# Patient Record
Sex: Female | Born: 1960
Health system: Southern US, Community
[De-identification: ages and names within clinical notes are randomized; demographics above are authoritative.]

## PROBLEM LIST (undated history)

## (undated) ENCOUNTER — Ambulatory Visit (HOSPITAL_COMMUNITY): Discharge status: Absent without leave | Payer: BLUE CROSS/BLUE SHIELD

## (undated) DIAGNOSIS — B019 Varicella without complication: Secondary | ICD-10-CM

## (undated) DIAGNOSIS — T7840XA Allergy, unspecified, initial encounter: Secondary | ICD-10-CM

## (undated) DIAGNOSIS — R739 Hyperglycemia, unspecified: Secondary | ICD-10-CM

## (undated) DIAGNOSIS — H6590 Unspecified nonsuppurative otitis media, unspecified ear: Principal | ICD-10-CM

## (undated) DIAGNOSIS — Z124 Encounter for screening for malignant neoplasm of cervix: Secondary | ICD-10-CM

## (undated) DIAGNOSIS — Z0000 Encounter for general adult medical examination without abnormal findings: Secondary | ICD-10-CM

## (undated) DIAGNOSIS — R000 Tachycardia, unspecified: Secondary | ICD-10-CM

## (undated) DIAGNOSIS — D649 Anemia, unspecified: Secondary | ICD-10-CM

## (undated) DIAGNOSIS — R768 Other specified abnormal immunological findings in serum: Secondary | ICD-10-CM

## (undated) DIAGNOSIS — T8859XA Other complications of anesthesia, initial encounter: Secondary | ICD-10-CM

## (undated) DIAGNOSIS — Z87440 Personal history of urinary (tract) infections: Secondary | ICD-10-CM

## (undated) DIAGNOSIS — R002 Palpitations: Secondary | ICD-10-CM

## (undated) DIAGNOSIS — Z531 Procedure and treatment not carried out because of patient's decision for reasons of belief and group pressure: Secondary | ICD-10-CM

## (undated) DIAGNOSIS — R030 Elevated blood-pressure reading, without diagnosis of hypertension: Secondary | ICD-10-CM

## (undated) DIAGNOSIS — N951 Menopausal and female climacteric states: Principal | ICD-10-CM

## (undated) DIAGNOSIS — R011 Cardiac murmur, unspecified: Secondary | ICD-10-CM

## (undated) DIAGNOSIS — N6320 Unspecified lump in the left breast, unspecified quadrant: Secondary | ICD-10-CM

## (undated) DIAGNOSIS — E785 Hyperlipidemia, unspecified: Secondary | ICD-10-CM

## (undated) HISTORY — DX: Elevated blood-pressure reading, without diagnosis of hypertension: R03.0

## (undated) HISTORY — DX: Encounter for screening for malignant neoplasm of cervix: Z12.4

## (undated) HISTORY — DX: Palpitations: R00.2

## (undated) HISTORY — DX: Hyperglycemia, unspecified: R73.9

## (undated) HISTORY — PX: LACERATION REPAIR: SHX5168

## (undated) HISTORY — DX: Tachycardia, unspecified: R00.0

## (undated) HISTORY — DX: Anemia, unspecified: D64.9

## (undated) HISTORY — DX: Unspecified nonsuppurative otitis media, unspecified ear: H65.90

## (undated) HISTORY — DX: Other specified abnormal immunological findings in serum: R76.8

## (undated) HISTORY — DX: Allergy, unspecified, initial encounter: T78.40XA

## (undated) HISTORY — DX: Hyperlipidemia, unspecified: E78.5

## (undated) HISTORY — DX: Cardiac murmur, unspecified: R01.1

## (undated) HISTORY — DX: Menopausal and female climacteric states: N95.1

## (undated) HISTORY — DX: Personal history of urinary (tract) infections: Z87.440

## (undated) HISTORY — DX: Unspecified lump in the left breast, unspecified quadrant: N63.20

## (undated) HISTORY — DX: Encounter for general adult medical examination without abnormal findings: Z00.00

## (undated) HISTORY — DX: Procedure and treatment not carried out because of patient's decision for reasons of belief and group pressure: Z53.1

## (undated) HISTORY — DX: Varicella without complication: B01.9

---

## 1997-03-08 ENCOUNTER — Observation Stay (HOSPITAL_COMMUNITY): Admission: AD | Admit: 1997-03-08 | Discharge: 1997-03-09 | Payer: Self-pay | Admitting: Gynecology

## 1997-03-16 ENCOUNTER — Observation Stay (HOSPITAL_COMMUNITY): Admission: AD | Admit: 1997-03-16 | Discharge: 1997-03-16 | Payer: Self-pay | Admitting: Gynecology

## 1997-03-17 ENCOUNTER — Inpatient Hospital Stay (HOSPITAL_COMMUNITY): Admission: AD | Admit: 1997-03-17 | Discharge: 1997-03-19 | Payer: Self-pay | Admitting: Obstetrics and Gynecology

## 1997-03-25 ENCOUNTER — Inpatient Hospital Stay (HOSPITAL_COMMUNITY): Admission: AD | Admit: 1997-03-25 | Discharge: 1997-03-25 | Payer: Self-pay | Admitting: Gynecology

## 1997-03-26 ENCOUNTER — Inpatient Hospital Stay (HOSPITAL_COMMUNITY): Admission: AD | Admit: 1997-03-26 | Discharge: 1997-03-29 | Payer: Self-pay | Admitting: Gynecology

## 1997-05-03 ENCOUNTER — Other Ambulatory Visit: Admission: RE | Admit: 1997-05-03 | Discharge: 1997-05-03 | Payer: Self-pay | Admitting: Obstetrics and Gynecology

## 1997-12-14 ENCOUNTER — Ambulatory Visit (HOSPITAL_COMMUNITY): Admission: RE | Admit: 1997-12-14 | Discharge: 1997-12-14 | Payer: Self-pay | Admitting: Obstetrics and Gynecology

## 1999-04-20 ENCOUNTER — Other Ambulatory Visit: Admission: RE | Admit: 1999-04-20 | Discharge: 1999-04-20 | Payer: Self-pay | Admitting: Gynecology

## 2000-06-06 ENCOUNTER — Other Ambulatory Visit: Admission: RE | Admit: 2000-06-06 | Discharge: 2000-06-06 | Payer: Self-pay | Admitting: Gynecology

## 2000-09-17 ENCOUNTER — Encounter: Admission: RE | Admit: 2000-09-17 | Discharge: 2000-09-17 | Payer: Self-pay | Admitting: Family Medicine

## 2000-09-17 ENCOUNTER — Encounter: Payer: Self-pay | Admitting: Family Medicine

## 2000-10-29 ENCOUNTER — Encounter: Admission: RE | Admit: 2000-10-29 | Discharge: 2000-10-29 | Payer: Self-pay | Admitting: Family Medicine

## 2000-10-29 ENCOUNTER — Encounter: Payer: Self-pay | Admitting: Family Medicine

## 2002-02-12 ENCOUNTER — Other Ambulatory Visit: Admission: RE | Admit: 2002-02-12 | Discharge: 2002-02-12 | Payer: Self-pay | Admitting: Gynecology

## 2004-03-13 ENCOUNTER — Other Ambulatory Visit: Admission: RE | Admit: 2004-03-13 | Discharge: 2004-03-13 | Payer: Self-pay | Admitting: Family Medicine

## 2005-11-14 ENCOUNTER — Other Ambulatory Visit: Admission: RE | Admit: 2005-11-14 | Discharge: 2005-11-14 | Payer: Self-pay | Admitting: Gynecology

## 2007-02-06 LAB — HM MAMMOGRAPHY

## 2008-02-06 LAB — HM PAP SMEAR

## 2009-11-09 ENCOUNTER — Ambulatory Visit: Payer: Self-pay | Admitting: Gynecology

## 2009-11-16 ENCOUNTER — Ambulatory Visit: Payer: Self-pay | Admitting: Gynecology

## 2011-03-07 ENCOUNTER — Ambulatory Visit (INDEPENDENT_AMBULATORY_CARE_PROVIDER_SITE_OTHER): Payer: PRIVATE HEALTH INSURANCE | Admitting: Family Medicine

## 2011-03-07 ENCOUNTER — Encounter: Payer: Self-pay | Admitting: Family Medicine

## 2011-03-07 DIAGNOSIS — R030 Elevated blood-pressure reading, without diagnosis of hypertension: Secondary | ICD-10-CM

## 2011-03-07 DIAGNOSIS — Z0000 Encounter for general adult medical examination without abnormal findings: Secondary | ICD-10-CM

## 2011-03-07 DIAGNOSIS — Z87440 Personal history of urinary (tract) infections: Secondary | ICD-10-CM

## 2011-03-07 DIAGNOSIS — R7309 Other abnormal glucose: Secondary | ICD-10-CM

## 2011-03-07 DIAGNOSIS — R768 Other specified abnormal immunological findings in serum: Secondary | ICD-10-CM

## 2011-03-07 DIAGNOSIS — R002 Palpitations: Secondary | ICD-10-CM

## 2011-03-07 DIAGNOSIS — R739 Hyperglycemia, unspecified: Secondary | ICD-10-CM

## 2011-03-07 DIAGNOSIS — E785 Hyperlipidemia, unspecified: Secondary | ICD-10-CM

## 2011-03-07 DIAGNOSIS — IMO0001 Reserved for inherently not codable concepts without codable children: Secondary | ICD-10-CM

## 2011-03-07 DIAGNOSIS — H409 Unspecified glaucoma: Secondary | ICD-10-CM

## 2011-03-07 DIAGNOSIS — R894 Abnormal immunological findings in specimens from other organs, systems and tissues: Secondary | ICD-10-CM

## 2011-03-07 DIAGNOSIS — D649 Anemia, unspecified: Secondary | ICD-10-CM

## 2011-03-07 DIAGNOSIS — R079 Chest pain, unspecified: Secondary | ICD-10-CM

## 2011-03-07 LAB — HEPATIC FUNCTION PANEL
ALT: 13 U/L (ref 0–35)
AST: 19 U/L (ref 0–37)
Albumin: 4.4 g/dL (ref 3.5–5.2)
Alkaline Phosphatase: 53 U/L (ref 39–117)
Bilirubin, Direct: 0.1 mg/dL (ref 0.0–0.3)
Total Bilirubin: 0.6 mg/dL (ref 0.3–1.2)
Total Protein: 7.2 g/dL (ref 6.0–8.3)

## 2011-03-07 LAB — RENAL FUNCTION PANEL
Albumin: 4.4 g/dL (ref 3.5–5.2)
BUN: 11 mg/dL (ref 6–23)
CO2: 27 mEq/L (ref 19–32)
Calcium: 9.1 mg/dL (ref 8.4–10.5)
Chloride: 106 mEq/L (ref 96–112)
Potassium: 4.1 mEq/L (ref 3.5–5.1)

## 2011-03-07 LAB — CBC
Hemoglobin: 12.6 g/dL (ref 12.0–15.0)
MCHC: 34 g/dL (ref 30.0–36.0)
MCV: 96.3 fl (ref 78.0–100.0)
Platelets: 206 10*3/uL (ref 150.0–400.0)
RBC: 3.84 Mil/uL — ABNORMAL LOW (ref 3.87–5.11)

## 2011-03-07 NOTE — Patient Instructions (Signed)

## 2011-03-12 ENCOUNTER — Encounter: Payer: Self-pay | Admitting: Family Medicine

## 2011-03-12 DIAGNOSIS — R0789 Other chest pain: Secondary | ICD-10-CM

## 2011-03-12 DIAGNOSIS — Z87440 Personal history of urinary (tract) infections: Secondary | ICD-10-CM | POA: Insufficient documentation

## 2011-03-12 DIAGNOSIS — R7689 Other specified abnormal immunological findings in serum: Secondary | ICD-10-CM

## 2011-03-12 DIAGNOSIS — D649 Anemia, unspecified: Secondary | ICD-10-CM | POA: Insufficient documentation

## 2011-03-12 DIAGNOSIS — R002 Palpitations: Secondary | ICD-10-CM | POA: Insufficient documentation

## 2011-03-12 DIAGNOSIS — H409 Unspecified glaucoma: Secondary | ICD-10-CM | POA: Insufficient documentation

## 2011-03-12 DIAGNOSIS — R739 Hyperglycemia, unspecified: Secondary | ICD-10-CM

## 2011-03-12 DIAGNOSIS — IMO0001 Reserved for inherently not codable concepts without codable children: Secondary | ICD-10-CM

## 2011-03-12 DIAGNOSIS — E785 Hyperlipidemia, unspecified: Secondary | ICD-10-CM

## 2011-03-12 DIAGNOSIS — R768 Other specified abnormal immunological findings in serum: Secondary | ICD-10-CM | POA: Insufficient documentation

## 2011-03-12 HISTORY — DX: Hyperlipidemia, unspecified: E78.5

## 2011-03-12 HISTORY — DX: Hyperglycemia, unspecified: R73.9

## 2011-03-12 HISTORY — DX: Other specified abnormal immunological findings in serum: R76.89

## 2011-03-12 HISTORY — DX: Other specified abnormal immunological findings in serum: R76.8

## 2011-03-12 HISTORY — DX: Reserved for inherently not codable concepts without codable children: IMO0001

## 2011-03-12 HISTORY — DX: Other chest pain: R07.89

## 2011-03-12 NOTE — Assessment & Plan Note (Signed)
Patient describes atypical electrical type discomfort lasting only seconds. Also notes a mild bit of congestion and thinks it may be related. Happened for several days last week and is now resolved. She had some other episodes of more like tightness which can occur at rest it has not been significant recently without associated symptoms. Did have a cardiac workup about 3 years ago. We'll consider further workup if symptoms return or worsen.

## 2011-03-12 NOTE — Assessment & Plan Note (Signed)
No significant episodes recently. Avoid caffeine and report worsening symptoms

## 2011-03-12 NOTE — Assessment & Plan Note (Signed)
Asymptomatic. 

## 2011-03-12 NOTE — Progress Notes (Signed)
**Note Maria-Identified via Obfuscation** Patient ID: Maria Lucas, female   DOB: Apr 05, 1960, 51 y.o.   MRN: [id number] Lina Hitch [id number] [birth year] 03/12/2011      Progress Note New Patient  Subjective  Chief Complaint  Chief Complaint  Patient presents with  . Establish Care    new patient    HPI  Patient is a 51 year old Caucasian female who is in today for new patient appointment. Overall she is in good health. Denies any significant recent illness although she has had some mild congestion and some occasional chest discomfort she describes as electric and fleeting. She says the impulses last for seconds and had no associated symptoms such as shortness of breath diaphoresis palpitations or nausea. She does have some separate episodes over the years both palpitations diagnosed as PVCs. These have not been occurring recently. She did undergo stress testing with outpatient cardiology roughly 3 years ago and reports that workup was negative. Her blood pressure is elevated in the office but she reports when she checks it outside the doctor's office she gets numbers ranging from roughly 1:15 to 130/60 to 70s. No headaches or other concerning symptoms. Follow with gynecology.  Past Medical History  Diagnosis Date  . Chicken pox as a child  . History of UTI   . Palpitations   . Chest pain 03/12/2011  . Glaucoma 03/12/2011  . Anemia     menorraghia  . Elevated antinuclear antibody (ANA) level 03/12/2011  . Hyperlipidemia 03/12/2011  . Hyperglycemia 03/12/2011    Past Surgical History  Procedure Date  . Laceration repair     age 84, dog attack, stitches in face, multipe sites right side of face    Family History  Problem Relation Age of Onset  . Heart attack Father 17  . Hypertension Father   . Hyperlipidemia Father   . Heart disease Father 32    MI  . Hypertension Sister   . Ovarian cysts Sister   . Heart attack Brother 43  . Hypertension Brother   . Heart disease Brother   . Anxiety disorder Daughter   . OCD Daughter    . Heart attack Maternal Grandmother   . Hypertension Maternal Grandmother   . Cancer Maternal Grandfather     skin  . Glaucoma Maternal Grandfather   . Stroke Paternal Grandfather   . Cancer Mother     skin    History   Social History  . Marital Status: Married    Spouse Name: N/A    Number of Children: N/A  . Years of Education: N/A   Occupational History  . Not on file.   Social History Main Topics  . Smoking status: Never Smoker   . Smokeless tobacco: Never Used  . Alcohol Use: No  . Drug Use: No  . Sexually Active: Yes -- Female partner(s)   Other Topics Concern  . Not on file   Social History Narrative  . No narrative on file    No current outpatient prescriptions on file prior to visit.    Allergies  Allergen Reactions  . Penicillins Rash  . Cleocin Rash    Review of Systems  Review of Systems  Constitutional: Negative for fever, chills and malaise/fatigue.  HENT: Negative for hearing loss, nosebleeds and congestion.   Eyes: Negative for discharge.  Respiratory: Negative for cough, sputum production, shortness of breath and wheezing.   Cardiovascular: Positive for chest pain and palpitations. Negative for leg swelling.  Gastrointestinal: Negative for heartburn, nausea, vomiting, abdominal pain, diarrhea, constipation and  blood in stool.  Genitourinary: Negative for dysuria, urgency, frequency and hematuria.  Musculoskeletal: Negative for myalgias, back pain and falls.  Skin: Negative for rash.  Neurological: Negative for dizziness, tremors, sensory change, focal weakness, loss of consciousness, weakness and headaches.  Endo/Heme/Allergies: Negative for polydipsia. Does not bruise/bleed easily.  Psychiatric/Behavioral: Negative for depression and suicidal ideas. The patient is not nervous/anxious and does not have insomnia.     Objective  BP 153/88  Pulse 95  Temp(Src) 98.9 F (37.2 C) (Temporal)  Ht 5' 4.25" (1.632 m)  Wt 145 lb (65.772 kg)   BMI 24.70 kg/m2  SpO2 99%  LMP 03/03/2011  Physical Exam  Physical Exam  Constitutional: She is oriented to person, place, and time and well-developed, well-nourished, and in no distress. No distress.  HENT:  Head: Normocephalic and atraumatic.  Right Ear: External ear normal.  Left Ear: External ear normal.  Nose: Nose normal.  Mouth/Throat: Oropharynx is clear and moist. No oropharyngeal exudate.  Eyes: Conjunctivae are normal. Pupils are equal, round, and reactive to light. Right eye exhibits no discharge. Left eye exhibits no discharge. No scleral icterus.  Neck: Normal range of motion. Neck supple. No thyromegaly present.  Cardiovascular: Normal rate, regular rhythm, normal heart sounds and intact distal pulses.   No murmur heard. Pulmonary/Chest: Effort normal and breath sounds normal. No respiratory distress. She has no wheezes. She has no rales.  Abdominal: Soft. Bowel sounds are normal. She exhibits no distension and no mass. There is no tenderness.  Musculoskeletal: Normal range of motion. She exhibits no edema and no tenderness.  Lymphadenopathy:    She has no cervical adenopathy.  Neurological: She is alert and oriented to person, place, and time. She has normal reflexes. No cranial nerve deficit. Coordination normal.  Skin: Skin is warm and dry. No rash noted. She is not diaphoretic.  Psychiatric: Mood, memory and affect normal.       Assessment & Plan   Elevated antinuclear antibody (ANA) level Asymptomatic  Anemia Mild in past will monitor  Palpitations No significant episodes recently. Avoid caffeine and report worsening symptoms  History of UTI No c/o, maintain adequate hydration  Hyperglycemia Will monitor, minimize simple carbs  Hyperlipidemia Will need to avoid trans fats, minimize saturated fats and simple carbs, consider MegaRed caps daily and continue to monitor  Glaucoma Follows with Opthamology  Chest pain Patient describes atypical  electrical type discomfort lasting only seconds. Also notes a mild bit of congestion and thinks it may be related. Happened for several days last week and is now resolved. She had some other episodes of more like tightness which can occur at rest it has not been significant recently without associated symptoms. Did have a cardiac workup about 3 years ago. We'll consider further workup if symptoms return or worsen.

## 2011-03-12 NOTE — Assessment & Plan Note (Signed)
No c/o, maintain adequate hydration

## 2011-03-12 NOTE — Assessment & Plan Note (Signed)
Will need to avoid trans fats, minimize saturated fats and simple carbs, consider MegaRed caps daily and continue to monitor

## 2011-03-12 NOTE — Assessment & Plan Note (Signed)
Follows with Opthamology 

## 2011-03-12 NOTE — Assessment & Plan Note (Signed)
Will monitor, minimize simple carbs 

## 2011-03-12 NOTE — Assessment & Plan Note (Signed)
Mild in past will monitor

## 2011-03-21 ENCOUNTER — Other Ambulatory Visit (HOSPITAL_COMMUNITY)
Admission: RE | Admit: 2011-03-21 | Discharge: 2011-03-21 | Disposition: A | Discharge status: Home / Self Care | Payer: PRIVATE HEALTH INSURANCE | Source: Ambulatory Visit | Attending: Family Medicine | Admitting: Family Medicine

## 2011-03-21 ENCOUNTER — Encounter: Payer: Self-pay | Admitting: Family Medicine

## 2011-03-21 ENCOUNTER — Ambulatory Visit (INDEPENDENT_AMBULATORY_CARE_PROVIDER_SITE_OTHER): Payer: PRIVATE HEALTH INSURANCE | Admitting: Family Medicine

## 2011-03-21 VITALS — BP 147/84 | HR 90 | Temp 99.2°F | Ht 64.25 in | Wt 141.8 lb

## 2011-03-21 DIAGNOSIS — R002 Palpitations: Secondary | ICD-10-CM

## 2011-03-21 DIAGNOSIS — R000 Tachycardia, unspecified: Secondary | ICD-10-CM

## 2011-03-21 DIAGNOSIS — N6320 Unspecified lump in the left breast, unspecified quadrant: Secondary | ICD-10-CM

## 2011-03-21 DIAGNOSIS — E785 Hyperlipidemia, unspecified: Secondary | ICD-10-CM

## 2011-03-21 DIAGNOSIS — Z1239 Encounter for other screening for malignant neoplasm of breast: Secondary | ICD-10-CM

## 2011-03-21 DIAGNOSIS — IMO0001 Reserved for inherently not codable concepts without codable children: Secondary | ICD-10-CM

## 2011-03-21 DIAGNOSIS — Z124 Encounter for screening for malignant neoplasm of cervix: Secondary | ICD-10-CM | POA: Insufficient documentation

## 2011-03-21 DIAGNOSIS — R030 Elevated blood-pressure reading, without diagnosis of hypertension: Secondary | ICD-10-CM

## 2011-03-21 DIAGNOSIS — N63 Unspecified lump in unspecified breast: Secondary | ICD-10-CM

## 2011-03-21 DIAGNOSIS — Z01419 Encounter for gynecological examination (general) (routine) without abnormal findings: Secondary | ICD-10-CM | POA: Insufficient documentation

## 2011-03-21 HISTORY — DX: Unspecified lump in the left breast, unspecified quadrant: N63.20

## 2011-03-21 HISTORY — DX: Tachycardia, unspecified: R00.0

## 2011-03-21 HISTORY — DX: Encounter for screening for malignant neoplasm of cervix: Z12.4

## 2011-03-21 NOTE — Assessment & Plan Note (Signed)
She reports home numbers fro 115-140 over 60 and 70s. Minimize sodium and monitor numbers, if running hi. Would consider Bystolic to help control tachycardia as well

## 2011-03-21 NOTE — Progress Notes (Signed)
**Note Maria-Identified via Obfuscation** Patient ID: Maria Lucas, female   DOB: [birth year], 51 y.o.   MRN: [id number] Maria Lucas [id number] 12/19/1960 03/21/2011      Progress Note-Follow Up  Subjective  Chief Complaint  Chief Complaint  Patient presents with  . Gynecologic Exam    pap  . chest still tight    HPI  Patient is a 51 year old Caucasian female present today for Pap smear. Overall doing well but she does continue to struggle with palpitations intermittently. This is been going on for years and she has had a cardiac workup in the past. She had 2 episodes of palpitations last week which lasted roughly 30-60 seconds. She had no associated symptoms. No diaphoresis, chest pain, shortness of breath, fatigue, syncope. She denies any obvious turgor. She checks her blood pressure at home and finds her systolic is roughly 1:15 to 140 and her diastolic is generally in the 60s and 70s. Her pulse varies as well in the high she's noted is 106. No recent illness, fevers, or chest pain no palpitations, shortness of breath, GI or GU complaints. She does still have a slight sensation of her chest feeling tight as if she hasn't fully recovered from her previous respiratory symptoms but no wheezing or actual pain is noted. She does note roughly one week of some tenderness in swelling in her left lateral breast  Past Medical History  Diagnosis Date  . Chicken pox as a child  . History of UTI   . Palpitations   . Chest pain 03/12/2011  . Glaucoma 03/12/2011  . Anemia     menorraghia  . Elevated antinuclear antibody (ANA) level 03/12/2011  . Hyperlipidemia 03/12/2011  . Hyperglycemia 03/12/2011  . Elevated BP 03/12/2011  . Screening for cervical cancer 03/21/2011  . Tachycardia 03/21/2011  . Breast mass, left 03/21/2011    Past Surgical History  Procedure Date  . Laceration repair     age 72, dog attack, stitches in face, multipe sites right side of face    Family History  Problem Relation Age of Onset  . Heart attack Father 50  .  Hypertension Father   . Hyperlipidemia Father   . Heart disease Father 33    MI  . Hypertension Sister   . Ovarian cysts Sister   . Heart attack Brother 43  . Hypertension Brother   . Heart disease Brother   . Anxiety disorder Daughter   . OCD Daughter   . Heart attack Maternal Grandmother   . Hypertension Maternal Grandmother   . Cancer Maternal Grandfather     skin  . Glaucoma Maternal Grandfather   . Stroke Paternal Grandfather   . Cancer Mother     skin    History   Social History  . Marital Status: Married    Spouse Name: N/A    Number of Children: N/A  . Years of Education: N/A   Occupational History  . Not on file.   Social History Main Topics  . Smoking status: Never Smoker   . Smokeless tobacco: Never Used  . Alcohol Use: No  . Drug Use: No  . Sexually Active: Yes -- Female partner(s)   Other Topics Concern  . Not on file   Social History Narrative  . No narrative on file    Current Outpatient Prescriptions on File Prior to Visit  Medication Sig Dispense Refill  . Calcium-Cholecalciferol (VITA-CALCIUM PO) Take by mouth daily. 1 chewable      . Calcium-Magnesium-Vitamin D (CITRACAL CALCIUM+D PO) Take by  mouth daily.      . Ferrous Gluconate (IRON) 240 (27 FE) MG TABS Take 1 tablet by mouth daily.      . Flaxseed Oil OIL Take 1,400 mg by mouth daily.      Marland Kitchen latanoprost (XALATAN) 0.005 % ophthalmic solution Place 1 drop into both eyes at bedtime.      . multivitamin-iron-minerals-folic acid (CENTRUM) chewable tablet Chew 1 tablet by mouth daily.        Allergies  Allergen Reactions  . Penicillins Rash  . Cleocin Rash    Review of Systems  Review of Systems  Constitutional: Negative for fever and malaise/fatigue.  HENT: Negative for congestion.   Eyes: Negative for discharge.  Respiratory: Negative for shortness of breath.   Cardiovascular: Positive for palpitations. Negative for chest pain and leg swelling.  Gastrointestinal: Negative for  nausea, abdominal pain and diarrhea.  Genitourinary: Negative for dysuria.  Musculoskeletal: Negative for falls.  Skin: Negative for rash.  Neurological: Negative for loss of consciousness and headaches.  Endo/Heme/Allergies: Negative for polydipsia.  Psychiatric/Behavioral: Negative for depression and suicidal ideas. The patient is not nervous/anxious and does not have insomnia.     Objective  BP 147/84  Pulse 90  Temp(Src) 99.2 F (37.3 C) (Temporal)  Ht 5' 4.25" (1.632 m)  Wt 141 lb 12.8 oz (64.32 kg)  BMI 24.15 kg/m2  SpO2 100%  LMP 03/03/2011  Physical Exam  Physical Exam  Constitutional: She is oriented to person, place, and time and well-developed, well-nourished, and in no distress. No distress.  HENT:  Head: Normocephalic and atraumatic.  Eyes: Conjunctivae are normal.  Neck: Neck supple. No thyromegaly present.  Cardiovascular: Normal rate, regular rhythm and normal heart sounds.   No murmur heard. Pulmonary/Chest: Effort normal and breath sounds normal. She has no wheezes.  Abdominal: She exhibits no distension and no mass.  Genitourinary: Vagina normal, uterus normal, cervix normal, right adnexa normal and left adnexa normal. No vaginal discharge found.       Right breast without lesions, skin changes or discharge. Left breast with 2 cm, firm, mobile lesion at 3 oclock no other abnormalities  Musculoskeletal: She exhibits no edema.       Mild scoliosis thoracic spine to left and then to right  Lymphadenopathy:    She has no cervical adenopathy.  Neurological: She is alert and oriented to person, place, and time.  Skin: Skin is warm and dry. No rash noted. She is not diaphoretic.  Psychiatric: Memory, affect and judgment normal.    Lab Results  Component Value Date   TSH 1.28 03/07/2011   Lab Results  Component Value Date   WBC 5.0 03/07/2011   HGB 12.6 03/07/2011   HCT 37.0 03/07/2011   MCV 96.3 03/07/2011   PLT 206.0 03/07/2011   Lab Results  Component  Value Date   CREATININE 0.8 03/07/2011   BUN 11 03/07/2011   NA 141 03/07/2011   K 4.1 03/07/2011   CL 106 03/07/2011   CO2 27 03/07/2011   Lab Results  Component Value Date   ALT 13 03/07/2011   AST 19 03/07/2011   ALKPHOS 53 03/07/2011   BILITOT 0.6 03/07/2011   Lab Results  Component Value Date   CHOL 250* 03/07/2011      Assessment & Plan  Screening for cervical cancer Pap done today no gyn c/o today, if pap normal will consider skipping next year.  Elevated BP She reports home numbers fro 115-140 over 60 and 70s. Minimize  sodium and monitor numbers, if running hi. Would consider Bystolic to help control tachycardia as well  Palpitations Elevated on arrival but improved with rest. Highest she has seen at home is 106, consider Bystolic if pulse remains elevated. Did have 2 episodes of palpitations lasting 30 to 60 seconds last week. No assoicated symptoms, she will report concerning symptoms  Hyperlipidemia Total cholesterol is 250 she has started [employer] and Metamucil, avoid trans fats, recheck HDL cholesterol panel in 2-3 months  Breast mass, left 2 cm lesion at 3 oclock in left breast, tender, will order MGM to further evaluated

## 2011-03-21 NOTE — Assessment & Plan Note (Addendum)
Elevated on arrival but improved with rest. Highest she has seen at home is 106, consider Bystolic if pulse remains elevated. Did have 2 episodes of palpitations lasting 30 to 60 seconds last week. No assoicated symptoms, she will report concerning symptoms

## 2011-03-21 NOTE — Assessment & Plan Note (Signed)
Pap done today no gyn c/o today, if pap normal will consider skipping next year.

## 2011-03-21 NOTE — Patient Instructions (Signed)

## 2011-03-21 NOTE — Assessment & Plan Note (Addendum)
2 cm lesion at 3 oclock in left breast, tender, will order MGM to further evaluated

## 2011-03-21 NOTE — Assessment & Plan Note (Signed)
Total cholesterol is 250 she has started [employer] and Metamucil, avoid trans fats, recheck HDL cholesterol panel in 2-3 months

## 2011-03-22 ENCOUNTER — Telehealth: Payer: Self-pay | Admitting: Family Medicine

## 2011-03-22 DIAGNOSIS — N6320 Unspecified lump in the left breast, unspecified quadrant: Secondary | ICD-10-CM

## 2011-03-22 NOTE — Telephone Encounter (Signed)
Radiology called requesting order for Ultrasound to further investigate lesion in left breast, order placed

## 2011-03-23 ENCOUNTER — Other Ambulatory Visit: Payer: PRIVATE HEALTH INSURANCE

## 2011-03-23 ENCOUNTER — Encounter: Payer: Self-pay | Admitting: Emergency Medicine

## 2011-03-23 DIAGNOSIS — Z1211 Encounter for screening for malignant neoplasm of colon: Secondary | ICD-10-CM

## 2011-03-27 ENCOUNTER — Ambulatory Visit
Admission: RE | Admit: 2011-03-27 | Discharge: 2011-03-27 | Disposition: A | Discharge status: Home / Self Care | Payer: PRIVATE HEALTH INSURANCE | Source: Ambulatory Visit | Attending: Family Medicine | Admitting: Family Medicine

## 2011-03-27 DIAGNOSIS — N6320 Unspecified lump in the left breast, unspecified quadrant: Secondary | ICD-10-CM

## 2011-03-27 DIAGNOSIS — N63 Unspecified lump in unspecified breast: Secondary | ICD-10-CM

## 2011-04-12 ENCOUNTER — Ambulatory Visit (INDEPENDENT_AMBULATORY_CARE_PROVIDER_SITE_OTHER): Payer: PRIVATE HEALTH INSURANCE | Admitting: Family Medicine

## 2011-04-12 ENCOUNTER — Encounter: Payer: Self-pay | Admitting: Family Medicine

## 2011-04-12 DIAGNOSIS — T7840XA Allergy, unspecified, initial encounter: Secondary | ICD-10-CM

## 2011-04-12 DIAGNOSIS — R030 Elevated blood-pressure reading, without diagnosis of hypertension: Secondary | ICD-10-CM

## 2011-04-12 DIAGNOSIS — IMO0001 Reserved for inherently not codable concepts without codable children: Secondary | ICD-10-CM

## 2011-04-12 DIAGNOSIS — J329 Chronic sinusitis, unspecified: Secondary | ICD-10-CM

## 2011-04-12 DIAGNOSIS — H6590 Unspecified nonsuppurative otitis media, unspecified ear: Secondary | ICD-10-CM

## 2011-04-12 HISTORY — DX: Unspecified nonsuppurative otitis media, unspecified ear: H65.90

## 2011-04-12 HISTORY — DX: Allergy, unspecified, initial encounter: T78.40XA

## 2011-04-12 MED ORDER — SALINE SPRAY 0.65 % NA SOLN: 1.0000 | NASAL | Status: DC | PRN

## 2011-04-12 MED ORDER — FLUTICASONE PROPIONATE 50 MCG/ACT NA SUSP: 2.0000 | Freq: Every day | NASAL | Status: DC

## 2011-04-12 MED ORDER — LORATADINE 10 MG PO TABS: 10.0000 mg | ORAL_TABLET | Freq: Every day | ORAL | Status: DC

## 2011-04-12 MED ORDER — CLARITHROMYCIN ER 500 MG PO TB24: 500.0000 mg | ORAL_TABLET | Freq: Two times a day (BID) | ORAL | Status: AC

## 2011-04-12 NOTE — Progress Notes (Signed)
Patient ID: [name], female   DOB: November 11, 1960, 51 y.o.   MRN: [id number] Maria Lucas [id number] [birth year] 04/12/2011      Progress Note-Follow Up  Subjective  Chief Complaint  Chief Complaint  Patient presents with  . Fever    low grade fever  . rushing sound in ear    right    HPI  Patient is a 51 year old Caucasian female who is in today for evaluation of a one-week history of increased pressure in her sense of fullness in her right ear. She describes a rushing in her right ear. Historically she said trouble with serous otitis media and acute otitis media with rupture in her left ear intermittently. No symptoms in the left ear at this time. She does have some low-grade fevers she worked the [employer], physiological at 99.8 over the week but otherwise denies any significant symptoms. Mild chilling is also noted. A very low grade nasal congestion but no rhinorrhea some tickling in her throat but no sore throat denies chest pains, wheezing, palpitation, shortness of breath, GI or GU complaints. No neurologic complaints, headache.  Past Medical History  Diagnosis Date  . Chicken pox as a child  . History of UTI   . Palpitations   . Chest pain 03/12/2011  . Glaucoma 03/12/2011  . Anemia     menorraghia  . Elevated antinuclear antibody (ANA) level 03/12/2011  . Hyperlipidemia 03/12/2011  . Hyperglycemia 03/12/2011  . Elevated BP 03/12/2011  . Screening for cervical cancer 03/21/2011  . Tachycardia 03/21/2011  . Breast mass, left 03/21/2011  . Allergic state 04/12/2011  . SOM (serous otitis media) 04/12/2011    Past Surgical History  Procedure Date  . Laceration repair     age 39, dog attack, stitches in face, multipe sites right side of face    Family History  Problem Relation Age of Onset  . Heart attack Father 64  . Hypertension Father   . Hyperlipidemia Father   . Heart disease Father 59    MI  . Hypertension Sister   . Ovarian cysts Sister   . Heart attack Brother 43  . Hypertension Brother     . Heart disease Brother   . Anxiety disorder Daughter   . OCD Daughter   . Heart attack Maternal Grandmother   . Hypertension Maternal Grandmother   . Cancer Maternal Grandfather     skin  . Glaucoma Maternal Grandfather   . Stroke Paternal Grandfather   . Cancer Mother     skin    History   Social History  . Marital Status: Married    Spouse Name: N/A    Number of Children: N/A  . Years of Education: N/A   Occupational History  . Not on file.   Social History Main Topics  . Smoking status: Never Smoker   . Smokeless tobacco: Never Used  . Alcohol Use: No  . Drug Use: No  . Sexually Active: Yes -- Female partner(s)   Other Topics Concern  . Not on file   Social History Narrative  . No narrative on file    Current Outpatient Prescriptions on File Prior to Visit  Medication Sig Dispense Refill  . aspirin 81 MG tablet Take 81 mg by mouth daily.      . Calcium-Cholecalciferol (VITA-CALCIUM PO) Take by mouth daily. 1 chewable      . Calcium-Magnesium-Vitamin D (CITRACAL CALCIUM+D PO) Take by mouth daily.      . Ferrous Gluconate (IRON) 240 (27 FE)  MG TABS Take 1 tablet by mouth daily.      . fish oil-omega-3 fatty acids 1000 MG capsule Take 2 g by mouth daily.      . Flaxseed Oil OIL Take 1,400 mg by mouth daily.      Marland Kitchen latanoprost (XALATAN) 0.005 % ophthalmic solution Place 1 drop into both eyes at bedtime.      . multivitamin-iron-minerals-folic acid (CENTRUM) chewable tablet Chew 1 tablet by mouth daily.      . psyllium (METAMUCIL) 58.6 % packet Take 1 packet by mouth daily.        Allergies  Allergen Reactions  . Penicillins Rash  . Cleocin Rash    Review of Systems  Review of Systems  Constitutional: Negative for fever and malaise/fatigue.  HENT: Positive for congestion and tinnitus. Negative for ear pain, sore throat and ear discharge.   Eyes: Negative for discharge.  Respiratory: Negative for cough and shortness of breath.   Cardiovascular: Negative  for chest pain, palpitations and leg swelling.  Gastrointestinal: Negative for nausea, abdominal pain and diarrhea.  Genitourinary: Negative for dysuria.  Musculoskeletal: Negative for falls.  Skin: Negative for rash.  Neurological: Negative for loss of consciousness and headaches.  Endo/Heme/Allergies: Negative for polydipsia.  Psychiatric/Behavioral: Negative for depression and suicidal ideas. The patient is not nervous/anxious and does not have insomnia.     Objective  BP 147/85  Pulse 105  Temp(Src) 98.6 F (37 C) (Temporal)  Ht 5' 4.25" (1.632 m)  Wt 141 lb 12.8 oz (64.32 kg)  BMI 24.15 kg/m2  SpO2 100%  Physical Exam  Physical Exam  Constitutional: She is oriented to person, place, and time and well-developed, well-nourished, and in no distress. No distress.  HENT:  Head: Normocephalic and atraumatic.  Mouth/Throat: No oropharyngeal exudate.       Nasal mucosa boggy, oropharynx displays cobble stoning, TMs dull with air fluid levels behind  Eyes: Conjunctivae and EOM are normal. Pupils are equal, round, and reactive to light. Right eye exhibits no discharge. Left eye exhibits no discharge. No scleral icterus.  Cardiovascular: Normal heart sounds and intact distal pulses.   Pulmonary/Chest: No respiratory distress. She has no rales.  Abdominal: Soft. Bowel sounds are normal. She exhibits no distension. There is tenderness.  Musculoskeletal: She exhibits no edema.  Lymphadenopathy:    She has no cervical adenopathy.  Neurological: She is alert and oriented to person, place, and time. Gait normal.  Skin: No rash noted. No erythema.  Psychiatric: Affect normal.    Lab Results  Component Value Date   TSH 1.28 03/07/2011   Lab Results  Component Value Date   WBC 5.0 03/07/2011   HGB 12.6 03/07/2011   HCT 37.0 03/07/2011   MCV 96.3 03/07/2011   PLT 206.0 03/07/2011   Lab Results  Component Value Date   CREATININE 0.8 03/07/2011   BUN 11 03/07/2011   NA 141 03/07/2011    K 4.1 03/07/2011   CL 106 03/07/2011   CO2 27 03/07/2011   Lab Results  Component Value Date   ALT 13 03/07/2011   AST 19 03/07/2011   ALKPHOS 53 03/07/2011   BILITOT 0.6 03/07/2011   Lab Results  Component Value Date   CHOL 250* 03/07/2011     Assessment & Plan  Elevated BP She has been watching her diet and sodium and will return in 2 months time for recheck  Allergic state Encouraged to start Loratadine daily, Fluticasone daily and nasal saline daily and prn.  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

## 2011-04-12 NOTE — Assessment & Plan Note (Signed)
She has been watching her diet and sodium and will return in 2 months time for recheck

## 2011-04-12 NOTE — Patient Instructions (Signed)
Serous Otitis Media   Serous otitis media is also known as otitis media with effusion (OME). It means there is fluid in the middle ear space. This space contains the bones for hearing and air. Air in the middle ear space helps to transmit sound.   The air gets there through the eustachian tube. This tube goes from the back of the throat to the middle ear space. It keeps the pressure in the middle ear the same as the outside world. It also helps to drain fluid from the middle ear space.  CAUSES   OME occurs when the eustachian tube gets blocked. Blockage can come from:   Ear infections.   Colds and other upper respiratory infections.   Allergies.   Irritants such as cigarette smoke.   Sudden changes in air pressure (such as descending in an airplane).   Enlarged adenoids.  During colds and upper respiratory infections, the middle ear space can become temporarily filled with fluid. This can happen after an ear infection also. Once the infection clears, the fluid will generally drain out of the ear through the eustachian tube. If it does not, then OME occurs.  SYMPTOMS    Hearing loss.   A feeling of fullness in the ear - but no pain.   Young children may not show any symptoms.  DIAGNOSIS    Diagnosis of OME is made by an ear exam.   Tests may be done to check on the movement of the eardrum.   Hearing exams may be done.  TREATMENT    The fluid most often goes away without treatment.   If allergy is the cause, allergy treatment may be helpful.   Fluid that persists for several months may require minor surgery. A small tube is placed in the ear drum to:   Drain the fluid.   Restore the air in the middle ear space.   In certain situations, antibiotics are used to avoid surgery.   Surgery may be done to remove enlarged adenoids (if this is the cause).  HOME CARE INSTRUCTIONS    Keep children away from tobacco smoke.   Be sure to keep follow up appointments, if any.  SEEK MEDICAL CARE IF:    Hearing is  not better in 3 months.   Hearing is worse.   Ear pain.   Drainage from the ear.   Dizziness.  Document Released: 04/14/2003 Document Revised: 01/11/2011 Document Reviewed: 02/12/2008  [employer] Patient Information 2012 [employer].

## 2011-04-12 NOTE — Assessment & Plan Note (Signed)
Encouraged to start Loratadine daily, Fluticasone daily and nasal saline daily and prn.

## 2011-04-12 NOTE — Assessment & Plan Note (Signed)
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

## 2011-04-13 ENCOUNTER — Telehealth: Payer: Self-pay

## 2011-04-13 NOTE — Telephone Encounter (Signed)
Patient picked up her nasal spray today and was unsure of how long to use it? Pt also stated that it has two different directions on it and would like to know which is better (2 sprays at once or one spray in the morning and one spray at night)?   I informed patient after speaking with MD to use nasal spray for one week and then prn after that. Also that there really isn't a difference in how to use the sprays. Patient was informed that she could try both ways. Pt states understandment

## 2011-04-13 NOTE — Telephone Encounter (Signed)
Patient can take either way, whichever works best for her

## 2011-06-27 ENCOUNTER — Other Ambulatory Visit (INDEPENDENT_AMBULATORY_CARE_PROVIDER_SITE_OTHER): Payer: PRIVATE HEALTH INSURANCE

## 2011-06-27 DIAGNOSIS — R030 Elevated blood-pressure reading, without diagnosis of hypertension: Secondary | ICD-10-CM

## 2011-06-27 LAB — HEPATIC FUNCTION PANEL
AST: 17 U/L (ref 0–37)
Albumin: 4 g/dL (ref 3.5–5.2)
Total Protein: 6.3 g/dL (ref 6.0–8.3)

## 2011-06-27 LAB — RENAL FUNCTION PANEL
Albumin: 4 g/dL (ref 3.5–5.2)
BUN: 15 mg/dL (ref 6–23)
CO2: 26 mEq/L (ref 19–32)
Creatinine, Ser: 0.8 mg/dL (ref 0.4–1.2)
GFR: 85.55 mL/min (ref >60.00)
Phosphorus: 3.1 mg/dL (ref 2.3–4.6)

## 2011-06-27 LAB — CBC
HCT: 37.1 % (ref 36.0–46.0)
Hemoglobin: 12.2 g/dL (ref 12.0–15.0)
MCHC: 33 g/dL (ref 30.0–36.0)
Platelets: 166 10*3/uL (ref 150.0–400.0)
RDW: 12.9 % (ref 11.5–14.6)

## 2011-06-27 LAB — LIPID PANEL
Cholesterol: 222 mg/dL — ABNORMAL HIGH (ref 0–200)
VLDL: 13.2 mg/dL (ref 0.0–40.0)

## 2011-07-04 ENCOUNTER — Encounter: Payer: Self-pay | Admitting: Family Medicine

## 2011-07-04 ENCOUNTER — Ambulatory Visit (INDEPENDENT_AMBULATORY_CARE_PROVIDER_SITE_OTHER): Payer: PRIVATE HEALTH INSURANCE | Admitting: Family Medicine

## 2011-07-04 VITALS — BP 124/76 | HR 87 | Temp 97.4°F | Ht 64.25 in | Wt 141.0 lb

## 2011-07-04 DIAGNOSIS — IMO0001 Reserved for inherently not codable concepts without codable children: Secondary | ICD-10-CM

## 2011-07-04 DIAGNOSIS — R000 Tachycardia, unspecified: Secondary | ICD-10-CM

## 2011-07-04 DIAGNOSIS — T7840XA Allergy, unspecified, initial encounter: Secondary | ICD-10-CM

## 2011-07-04 DIAGNOSIS — R7309 Other abnormal glucose: Secondary | ICD-10-CM

## 2011-07-04 DIAGNOSIS — R030 Elevated blood-pressure reading, without diagnosis of hypertension: Secondary | ICD-10-CM

## 2011-07-04 DIAGNOSIS — E785 Hyperlipidemia, unspecified: Secondary | ICD-10-CM

## 2011-07-04 DIAGNOSIS — R739 Hyperglycemia, unspecified: Secondary | ICD-10-CM

## 2011-07-04 NOTE — Assessment & Plan Note (Signed)
Sugar 81 fasting this month, will continue to monitor

## 2011-07-04 NOTE — Assessment & Plan Note (Signed)
Good number on repeat today, avoid sodium and increase exercise

## 2011-07-04 NOTE — Patient Instructions (Signed)

## 2011-07-04 NOTE — Assessment & Plan Note (Addendum)
Mild elevation, avoid trans fats, increase exercise, continue MegaRed and Metamucil recheck next year

## 2011-07-04 NOTE — Assessment & Plan Note (Signed)
Good rate today 

## 2011-07-04 NOTE — Assessment & Plan Note (Signed)
No recent flares, meds working well

## 2011-07-04 NOTE — Progress Notes (Signed)
**Note Maria-Identified via Obfuscation** Patient ID: Maria Lucas, female   DOB: 01-Jun-1960, 51 y.o.   MRN: [id number] Maria Lucas [id number] May 22, 1960 07/04/2011      Progress Note-Follow Up  Subjective  Chief Complaint  Chief Complaint  Patient presents with  . Follow-up    4 month    HPI  Is a 51 year old Caucasian female in today for followup on lab work. She has been feeling well. She denies any recent illness, fevers, chills, chest pain, palpitations, headache, congestion, GI or GU complaints. Her allergies have not been flared. She does note her menstrual cycles have become somewhat irregular. May she skipped a month and then in May had a heavier than normal cycle. Since then she's had a normal cycle. No significant abdominal pain. No fatigue malaise. Blood pressures continue to run 110s to 120s  Past Medical History  Diagnosis Date  . Chicken pox as a child  . History of UTI   . Palpitations   . Chest pain 03/12/2011  . Glaucoma 03/12/2011  . Anemia     menorraghia  . Elevated antinuclear antibody (ANA) level 03/12/2011  . Hyperlipidemia 03/12/2011  . Hyperglycemia 03/12/2011  . Elevated BP 03/12/2011  . Screening for cervical cancer 03/21/2011  . Tachycardia 03/21/2011  . Breast mass, left 03/21/2011  . Allergic state 04/12/2011  . SOM (serous otitis media) 04/12/2011    Past Surgical History  Procedure Date  . Laceration repair     age 53, dog attack, stitches in face, multipe sites right side of face    Family History  Problem Relation Age of Onset  . Heart attack Father 44  . Hypertension Father   . Hyperlipidemia Father   . Heart disease Father 66    MI  . Hypertension Sister   . Ovarian cysts Sister   . Heart attack Brother 43  . Hypertension Brother   . Heart disease Brother   . Anxiety disorder Daughter   . OCD Daughter   . Heart attack Maternal Grandmother   . Hypertension Maternal Grandmother   . Cancer Maternal Grandfather     skin  . Glaucoma Maternal Grandfather   . Stroke Paternal  Grandfather   . Cancer Mother     skin    History   Social History  . Marital Status: Married    Spouse Name: N/A    Number of Children: N/A  . Years of Education: N/A   Occupational History  . Not on file.   Social History Main Topics  . Smoking status: Never Smoker   . Smokeless tobacco: Never Used  . Alcohol Use: No  . Drug Use: No  . Sexually Active: Yes -- Female partner(s)   Other Topics Concern  . Not on file   Social History Narrative  . No narrative on file    Current Outpatient Prescriptions on File Prior to Visit  Medication Sig Dispense Refill  . aspirin 81 MG tablet Take 81 mg by mouth daily.      . Calcium-Cholecalciferol (VITA-CALCIUM PO) Take by mouth daily. 1 chewable      . Calcium-Magnesium-Vitamin D (CITRACAL CALCIUM+D PO) Take by mouth daily.      . Ferrous Gluconate (IRON) 240 (27 FE) MG TABS Take 1 tablet by mouth daily.      . fish oil-omega-3 fatty acids 1000 MG capsule Take 2 g by mouth daily.      . Flaxseed Oil OIL Take 1,400 mg by mouth daily.      Marland Kitchen latanoprost (XALATAN)  0.005 % ophthalmic solution Place 1 drop into both eyes at bedtime.      Marland Kitchen loratadine (CLARITIN) 10 MG tablet Take 1 tablet (10 mg total) by mouth daily.  30 tablet  11  . multivitamin-iron-minerals-folic acid (CENTRUM) chewable tablet Chew 1 tablet by mouth daily.      . psyllium (METAMUCIL) 58.6 % packet Take 1 packet by mouth daily.        Allergies  Allergen Reactions  . Penicillins Rash  . Clindamycin Hcl Rash    Review of Systems  Review of Systems  Constitutional: Negative for fever and malaise/fatigue.  HENT: Negative for congestion.   Eyes: Negative for discharge.  Respiratory: Negative for shortness of breath.   Cardiovascular: Negative for chest pain, palpitations and leg swelling.  Gastrointestinal: Negative for nausea, abdominal pain and diarrhea.  Genitourinary: Negative for dysuria.  Musculoskeletal: Negative for falls.  Skin: Negative for rash.    Neurological: Negative for loss of consciousness and headaches.  Endo/Heme/Allergies: Negative for polydipsia.  Psychiatric/Behavioral: Negative for depression and suicidal ideas. The patient is not nervous/anxious and does not have insomnia.     Objective  BP 124/76  Pulse 87  Temp(Src) 97.4 F (36.3 C) (Temporal)  Ht 5' 4.25" (1.632 m)  Wt 141 lb (63.957 kg)  BMI 24.01 kg/m2  SpO2 100%  LMP 06/16/2011  Physical Exam  Physical Exam  Constitutional: She is oriented to person, place, and time and well-developed, well-nourished, and in no distress. No distress.  HENT:  Head: Normocephalic and atraumatic.  Eyes: Conjunctivae are normal.  Neck: Neck supple. No thyromegaly present.  Cardiovascular: Normal rate, regular rhythm and normal heart sounds.   No murmur heard. Pulmonary/Chest: Effort normal and breath sounds normal. She has no wheezes.  Abdominal: She exhibits no distension and no mass.  Musculoskeletal: She exhibits no edema.  Lymphadenopathy:    She has no cervical adenopathy.  Neurological: She is alert and oriented to person, place, and time.  Skin: Skin is warm and dry. No rash noted. She is not diaphoretic.  Psychiatric: Memory, affect and judgment normal.    Lab Results  Component Value Date   TSH 1.28 03/07/2011   Lab Results  Component Value Date   WBC 6.3 06/27/2011   HGB 12.2 06/27/2011   HCT 37.1 06/27/2011   MCV 95.7 06/27/2011   PLT 166.0 06/27/2011   Lab Results  Component Value Date   CREATININE 0.8 06/27/2011   BUN 15 06/27/2011   NA 139 06/27/2011   K 4.2 06/27/2011   CL 105 06/27/2011   CO2 26 06/27/2011   Lab Results  Component Value Date   ALT 10 06/27/2011   AST 17 06/27/2011   ALKPHOS 49 06/27/2011   BILITOT 0.6 06/27/2011   Lab Results  Component Value Date   CHOL 222* 06/27/2011   Lab Results  Component Value Date   HDL 81.10 06/27/2011   No results found for this basename: LDLCALC   Lab Results  Component Value Date   TRIG  66.0 06/27/2011   Lab Results  Component Value Date   CHOLHDL 3 06/27/2011     Assessment & Plan  Hyperlipidemia Mild elevation, avoid trans fats, increase exercise, continue MegaRed and Metamucil recheck next year  Tachycardia Good rate today  Elevated BP Good number on repeat today, avoid sodium and increase exercise  Hyperglycemia Sugar 81 fasting this month, will continue to monitor  Allergic state No recent flares, meds working well

## 2012-03-26 ENCOUNTER — Other Ambulatory Visit (INDEPENDENT_AMBULATORY_CARE_PROVIDER_SITE_OTHER): Payer: PRIVATE HEALTH INSURANCE

## 2012-03-26 DIAGNOSIS — Z0000 Encounter for general adult medical examination without abnormal findings: Secondary | ICD-10-CM

## 2012-03-26 LAB — HEPATIC FUNCTION PANEL
Bilirubin, Direct: 0.1 mg/dL (ref 0.0–0.3)
Total Bilirubin: 0.7 mg/dL (ref 0.3–1.2)

## 2012-03-26 LAB — LIPID PANEL
HDL: 96.8 mg/dL (ref >39.00)
Total CHOL/HDL Ratio: 3
Triglycerides: 70 mg/dL (ref 0.0–149.0)

## 2012-03-26 LAB — CBC
HCT: 39 % (ref 36.0–46.0)
Hemoglobin: 12.8 g/dL (ref 12.0–15.0)
Platelets: 165 10*3/uL (ref 150.0–400.0)
RBC: 4.13 Mil/uL (ref 3.87–5.11)
WBC: 4.8 10*3/uL (ref 4.5–10.5)

## 2012-03-26 LAB — RENAL FUNCTION PANEL
CO2: 26 mEq/L (ref 19–32)
Calcium: 9.7 mg/dL (ref 8.4–10.5)
Creatinine, Ser: 0.8 mg/dL (ref 0.4–1.2)
GFR: 77.05 mL/min (ref >60.00)
Glucose, Bld: 95 mg/dL (ref 70–99)
Potassium: 4.3 mEq/L (ref 3.5–5.1)
Sodium: 139 mEq/L (ref 135–145)

## 2012-03-26 LAB — TSH: TSH: 1.22 u[IU]/mL (ref 0.35–5.50)

## 2012-03-26 NOTE — Progress Notes (Signed)
Labs only

## 2012-04-02 ENCOUNTER — Ambulatory Visit (INDEPENDENT_AMBULATORY_CARE_PROVIDER_SITE_OTHER): Payer: PRIVATE HEALTH INSURANCE | Admitting: Family Medicine

## 2012-04-02 ENCOUNTER — Other Ambulatory Visit (HOSPITAL_COMMUNITY)
Admission: RE | Admit: 2012-04-02 | Discharge: 2012-04-02 | Disposition: A | Discharge status: Home / Self Care | Payer: PRIVATE HEALTH INSURANCE | Source: Ambulatory Visit | Attending: Family Medicine | Admitting: Family Medicine

## 2012-04-02 ENCOUNTER — Encounter: Payer: Self-pay | Admitting: Family Medicine

## 2012-04-02 VITALS — BP 136/76 | HR 99 | Temp 99.0°F | Ht 64.25 in | Wt 145.8 lb

## 2012-04-02 DIAGNOSIS — R000 Tachycardia, unspecified: Secondary | ICD-10-CM

## 2012-04-02 DIAGNOSIS — H6590 Unspecified nonsuppurative otitis media, unspecified ear: Secondary | ICD-10-CM

## 2012-04-02 DIAGNOSIS — Z124 Encounter for screening for malignant neoplasm of cervix: Secondary | ICD-10-CM

## 2012-04-02 DIAGNOSIS — T7840XD Allergy, unspecified, subsequent encounter: Secondary | ICD-10-CM

## 2012-04-02 DIAGNOSIS — E785 Hyperlipidemia, unspecified: Secondary | ICD-10-CM

## 2012-04-02 DIAGNOSIS — R7309 Other abnormal glucose: Secondary | ICD-10-CM

## 2012-04-02 DIAGNOSIS — R739 Hyperglycemia, unspecified: Secondary | ICD-10-CM

## 2012-04-02 DIAGNOSIS — Z5189 Encounter for other specified aftercare: Secondary | ICD-10-CM

## 2012-04-02 DIAGNOSIS — R002 Palpitations: Secondary | ICD-10-CM

## 2012-04-02 DIAGNOSIS — Z0000 Encounter for general adult medical examination without abnormal findings: Secondary | ICD-10-CM

## 2012-04-02 DIAGNOSIS — Z01419 Encounter for gynecological examination (general) (routine) without abnormal findings: Secondary | ICD-10-CM | POA: Insufficient documentation

## 2012-04-02 DIAGNOSIS — D649 Anemia, unspecified: Secondary | ICD-10-CM

## 2012-04-02 NOTE — Assessment & Plan Note (Signed)
Resolved with recent blood draw 

## 2012-04-02 NOTE — Assessment & Plan Note (Signed)
Well-controlled  at this time 

## 2012-04-02 NOTE — Assessment & Plan Note (Signed)
Avoid trans fat, avoid simple carbs and saturated fats, continue fatty acid supplements and exercise, recheck in 4-6 months.

## 2012-04-02 NOTE — Assessment & Plan Note (Signed)
No recent episodes

## 2012-04-02 NOTE — Assessment & Plan Note (Signed)
Using Claritin daily

## 2012-04-02 NOTE — Assessment & Plan Note (Signed)
Sugar 95 on recent blood draw, encouraged to minimize simple carbs

## 2012-04-02 NOTE — Assessment & Plan Note (Addendum)
Pap today, no concerns identified on exam today, can consider switching to every 2-3 year paps if normal. Patient is perimenopausal at present with minimimal symptoms

## 2012-04-02 NOTE — Patient Instructions (Addendum)

## 2012-04-02 NOTE — Assessment & Plan Note (Signed)
Mildly symptomatic in left ear. Encouraged saline nasal flushes and valsalva maneuvers daily

## 2012-04-02 NOTE — Progress Notes (Signed)
**Note Maria-Identified via Obfuscation** Patient ID: Maria Lucas, female   DOB: 1960/12/16, 52 y.o.   MRN: [id number] Maria Lucas [id number] [birth year] 04/02/2012      Progress Note-Follow Up  Subjective  Chief Complaint  Chief Complaint  Patient presents with  . Annual Exam    physical  . Gynecologic Exam    pap and breast exam    HPI  Patient is a 52 year old Caucasian female who is in today for annual exam. Overall she feels well. She noted she does have persistent serous otitis media with her ear crackling and popping frequently on the left. No hearing loss or tinnitus. She reports feeling well and offers no acute complaints. Her last menstrual period was late in December and was preceded by a heavy period in November and 5 months no bleeding before that.  Past Medical History  Diagnosis Date  . Chicken pox as a child  . History of UTI   . Palpitations   . Chest pain 03/12/2011  . Glaucoma(365) 03/12/2011  . Anemia     menorraghia  . Elevated antinuclear antibody (ANA) level 03/12/2011  . Hyperlipidemia 03/12/2011  . Hyperglycemia 03/12/2011  . Elevated BP 03/12/2011  . Screening for cervical cancer 03/21/2011  . Tachycardia 03/21/2011  . Breast mass, left 03/21/2011  . Allergic state 04/12/2011  . SOM (serous otitis media) 04/12/2011    Past Surgical History  Procedure Laterality Date  . Laceration repair      age 52, dog attack, stitches in face, multipe sites right side of face    Family History  Problem Relation Age of Onset  . Heart attack Father 38  . Hypertension Father   . Hyperlipidemia Father   . Heart disease Father 8    MI  . Hypertension Sister   . Ovarian cysts Sister   . Heart attack Brother 43  . Hypertension Brother   . Heart disease Brother   . Anxiety disorder Daughter   . OCD Daughter   . Heart attack Maternal Grandmother   . Hypertension Maternal Grandmother   . Cancer Maternal Grandfather     skin, bcc  . Glaucoma Maternal Grandfather   . Stroke Paternal Grandfather   . Cancer  Mother     skin, bcc, scc  . Heart disease Mother     arrythmia    History   Social History  . Marital Status: Married    Spouse Name: N/A    Number of Children: N/A  . Years of Education: N/A   Occupational History  . Not on file.   Social History Main Topics  . Smoking status: Never Smoker   . Smokeless tobacco: Never Used  . Alcohol Use: No  . Drug Use: No  . Sexually Active: Yes -- Female partner(s)   Other Topics Concern  . Not on file   Social History Narrative  . No narrative on file    Current Outpatient Prescriptions on File Prior to Visit  Medication Sig Dispense Refill  . aspirin 81 MG tablet Take 81 mg by mouth daily.      . Calcium-Cholecalciferol (VITA-CALCIUM PO) Take by mouth daily. 1 chewable      . Calcium-Magnesium-Vitamin D (CITRACAL CALCIUM+D PO) Take by mouth daily.      . fish oil-omega-3 fatty acids 1000 MG capsule Take 2 g by mouth daily.      . Flaxseed Oil OIL Take 1,400 mg by mouth daily.      Marland Kitchen latanoprost (XALATAN) 0.005 % ophthalmic solution Place 1  drop into both eyes at bedtime.      Marland Kitchen loratadine (CLARITIN) 10 MG tablet Take 1 tablet (10 mg total) by mouth daily.  30 tablet  11  . multivitamin-iron-minerals-folic acid (CENTRUM) chewable tablet Chew 1 tablet by mouth daily.      . psyllium (METAMUCIL) 58.6 % packet Take 1 packet by mouth daily.      . Ferrous Gluconate (IRON) 240 (27 FE) MG TABS Take 1 tablet by mouth daily.       No current facility-administered medications on file prior to visit.    Allergies  Allergen Reactions  . Penicillins Rash  . Clindamycin Hcl Rash    Review of Systems  Review of Systems  Constitutional: Negative for fever and malaise/fatigue.  HENT: Negative for congestion.   Eyes: Negative for discharge.  Respiratory: Negative for shortness of breath.   Cardiovascular: Negative for chest pain, palpitations and leg swelling.  Gastrointestinal: Negative for nausea, abdominal pain and diarrhea.   Genitourinary: Negative for dysuria.  Musculoskeletal: Negative for falls.  Skin: Negative for rash.  Neurological: Negative for loss of consciousness and headaches.  Endo/Heme/Allergies: Negative for polydipsia.  Psychiatric/Behavioral: Negative for depression and suicidal ideas. The patient is not nervous/anxious and does not have insomnia.     Objective  BP 136/76  Pulse 99  Temp(Src) 99 F (37.2 C) (Temporal)  Ht 5' 4.25" (1.632 m)  Wt 145 lb 12.8 oz (66.134 kg)  BMI 24.83 kg/m2  SpO2 100%  LMP 01/31/2012  Physical Exam  Physical Exam  Constitutional: She is oriented to person, place, and time and well-developed, well-nourished, and in no distress. No distress.  HENT:  Head: Normocephalic and atraumatic.  Eyes: Conjunctivae are normal.  Neck: Neck supple. No thyromegaly present.  Cardiovascular: Normal rate, regular rhythm and normal heart sounds.   No murmur heard. Pulmonary/Chest: Effort normal and breath sounds normal. She has no wheezes.  Abdominal: She exhibits no distension and no mass.  Genitourinary: Vagina normal, uterus normal, cervix normal, right adnexa normal and left adnexa normal. No vaginal discharge found.  Musculoskeletal: She exhibits no edema.  Lymphadenopathy:    She has no cervical adenopathy.  Neurological: She is alert and oriented to person, place, and time.  Skin: Skin is warm and dry. No rash noted. She is not diaphoretic.  Psychiatric: Memory, affect and judgment normal.    Lab Results  Component Value Date   TSH 1.22 03/26/2012   Lab Results  Component Value Date   WBC 4.8 03/26/2012   HGB 12.8 03/26/2012   HCT 39.0 03/26/2012   MCV 94.4 03/26/2012   PLT 165.0 03/26/2012   Lab Results  Component Value Date   CREATININE 0.8 03/26/2012   BUN 16 03/26/2012   NA 139 03/26/2012   K 4.3 03/26/2012   CL 103 03/26/2012   CO2 26 03/26/2012   Lab Results  Component Value Date   ALT 15 03/26/2012   AST 22 03/26/2012   ALKPHOS 63 03/26/2012    BILITOT 0.7 03/26/2012   Lab Results  Component Value Date   CHOL 251* 03/26/2012   Lab Results  Component Value Date   HDL 96.80 03/26/2012   No results found for this basename: LDLCALC   Lab Results  Component Value Date   TRIG 70.0 03/26/2012   Lab Results  Component Value Date   CHOLHDL 3 03/26/2012     Assessment & Plan  Screening for cervical cancer Pap today, no concerns identified on exam today, can  consider switching to every 2-3 year paps if normal. Patient is perimenopausal at present with minimimal symptoms  Tachycardia Well controlled at this time  Hyperglycemia Sugar 95 on recent blood draw, encouraged to minimize simple carbs  Hyperlipidemia Avoid trans fat, avoid simple carbs and saturated fats, continue fatty acid supplements and exercise, recheck in 4-6 months.  Palpitations No recent episodes.  Allergic state Using Claritin daily  SOM (serous otitis media) Mildly symptomatic in left ear. Encouraged saline nasal flushes and valsalva maneuvers daily  Anemia Resolved with recent blood draw

## 2012-04-08 NOTE — Progress Notes (Signed)
Quick Note:  Patient Informed and voiced understanding ______ 

## 2012-09-23 ENCOUNTER — Telehealth: Payer: Self-pay

## 2012-09-23 DIAGNOSIS — E785 Hyperlipidemia, unspecified: Secondary | ICD-10-CM

## 2012-09-23 LAB — LIPID PANEL
HDL: 88 mg/dL (ref >39)
LDL Cholesterol: 149 mg/dL — ABNORMAL HIGH (ref 0–99)
Total CHOL/HDL Ratio: 2.9 Ratio
Triglycerides: 78 mg/dL (ref <150)
VLDL: 16 mg/dL (ref 0–40)

## 2012-09-23 NOTE — Telephone Encounter (Signed)
Please send flp dx hyperlipidemia.

## 2012-09-23 NOTE — Telephone Encounter (Signed)
Pt came in this morning for labs? I don't see at last visit were MD told pt to come in before visit to do labs? Please advise what/if any labs need to be ordered.

## 2012-09-30 ENCOUNTER — Ambulatory Visit (INDEPENDENT_AMBULATORY_CARE_PROVIDER_SITE_OTHER): Payer: BC Managed Care – PPO | Admitting: Family Medicine

## 2012-09-30 ENCOUNTER — Encounter: Payer: Self-pay | Admitting: Family Medicine

## 2012-09-30 ENCOUNTER — Telehealth: Payer: Self-pay | Admitting: Family Medicine

## 2012-09-30 VITALS — BP 122/80 | HR 89 | Temp 98.1°F | Resp 16 | Ht 64.25 in | Wt 147.0 lb

## 2012-09-30 DIAGNOSIS — Z5189 Encounter for other specified aftercare: Secondary | ICD-10-CM

## 2012-09-30 DIAGNOSIS — IMO0001 Reserved for inherently not codable concepts without codable children: Secondary | ICD-10-CM

## 2012-09-30 DIAGNOSIS — D649 Anemia, unspecified: Secondary | ICD-10-CM

## 2012-09-30 DIAGNOSIS — E785 Hyperlipidemia, unspecified: Secondary | ICD-10-CM

## 2012-09-30 DIAGNOSIS — R739 Hyperglycemia, unspecified: Secondary | ICD-10-CM

## 2012-09-30 DIAGNOSIS — N951 Menopausal and female climacteric states: Secondary | ICD-10-CM

## 2012-09-30 DIAGNOSIS — R002 Palpitations: Secondary | ICD-10-CM

## 2012-09-30 DIAGNOSIS — R030 Elevated blood-pressure reading, without diagnosis of hypertension: Secondary | ICD-10-CM

## 2012-09-30 DIAGNOSIS — T7840XD Allergy, unspecified, subsequent encounter: Secondary | ICD-10-CM

## 2012-09-30 NOTE — Patient Instructions (Addendum)
Probiotics daily   Cholesterol Cholesterol is a white, waxy, fat-like protein needed by your body in small amounts. The liver makes all the cholesterol you need. It is carried from the liver by the blood through the blood vessels. Deposits (plaque) may build up on blood vessel walls. This makes the arteries narrower and stiffer. Plaque increases the risk for heart attack and stroke. You cannot feel your cholesterol level even if it is very high. The only way to know is by a blood test to check your lipid (fats) levels. Once you know your cholesterol levels, you should keep a record of the test results. Work with your caregiver to to keep your levels in the desired range. WHAT THE RESULTS MEAN:  Total cholesterol is a rough measure of all the cholesterol in your blood.  LDL is the so-called bad cholesterol. This is the type that deposits cholesterol in the walls of the arteries. You want this level to be low.  HDL is the good cholesterol because it cleans the arteries and carries the LDL away. You want this level to be high.  Triglycerides are fat that the body can either burn for energy or store. High levels are closely linked to heart disease. DESIRED LEVELS:  Total cholesterol below 200.  LDL below 100 for people at risk, below 70 for very high risk.  HDL above 50 is good, above 60 is best.  Triglycerides below 150. HOW TO LOWER YOUR CHOLESTEROL:  Diet.  Choose fish or white meat chicken and [place], roasted or baked. Limit fatty cuts of red meat, fried foods, and processed meats, such as sausage and lunch meat.  Eat lots of fresh fruits and vegetables. Choose whole grains, beans, pasta, potatoes and cereals.  Use only small amounts of olive, corn or canola oils. Avoid butter, mayonnaise, shortening or palm kernel oils. Avoid foods with trans-fats.  Use skim/nonfat milk and low-fat/nonfat yogurt and cheeses. Avoid whole milk, cream, ice cream, egg yolks and cheeses. Healthy  desserts include angel food cake, ginger snaps, animal crackers, hard candy, popsicles, and low-fat/nonfat frozen yogurt. Avoid pastries, cakes, pies and cookies.  Exercise.  A regular program helps decrease LDL and raises HDL.  Helps with weight control.  Do things that increase your activity level like gardening, walking, or taking the stairs.  Medication.  May be prescribed by your caregiver to help lowering cholesterol and the risk for heart disease.  You may need medicine even if your levels are normal if you have several risk factors. HOME CARE INSTRUCTIONS   Follow your diet and exercise programs as suggested by your caregiver.  Take medications as directed.  Have blood work done when your caregiver feels it is necessary. MAKE SURE YOU:   Understand these instructions.  Will watch your condition.  Will get help right away if you are not doing well or get worse. Document Released: 10/17/2000 Document Revised: 04/16/2011 Document Reviewed: 04/09/2007 [facility] Patient Information 2014 [place], [place].

## 2012-09-30 NOTE — Telephone Encounter (Signed)
Lab order last week in feb 2015 Next visit, annual lipid, renal hepatic, tsh, cbc

## 2012-10-05 ENCOUNTER — Encounter: Payer: Self-pay | Admitting: Family Medicine

## 2012-10-05 DIAGNOSIS — N951 Menopausal and female climacteric states: Secondary | ICD-10-CM

## 2012-10-05 HISTORY — DX: Menopausal and female climacteric states: N95.1

## 2012-10-05 NOTE — Progress Notes (Signed)
**Note Maria-Identified via Obfuscation** Patient ID: Maria Lucas, female   DOB: 1960/12/04, 52 y.o.   MRN: [id number] Maria Lucas [id number] 01/21/1961 10/05/2012      Progress Note-Follow Up  Subjective  Chief Complaint  Chief Complaint  Patient presents with  . Hyperglycemia    Pt here for follow up.  . Hyperlipidemia    Pt here for follow up.  . Anemia    Pt here for follow up.    HPI  Patient is a 52 year old Caucasian female in today for followup. Feeling well. Has not had a menstrual cycle since December. Is not struggling with significant hot flashes. No recent palpitations. No chest pain, shortness of breath, allergies, GI or GU complaints at this time.  Past Medical History  Diagnosis Date  . Chicken pox as a child  . History of UTI   . Palpitations   . Chest pain 03/12/2011  . Glaucoma 03/12/2011  . Anemia     menorraghia  . Elevated antinuclear antibody (ANA) level 03/12/2011  . Hyperlipidemia 03/12/2011  . Hyperglycemia 03/12/2011  . Elevated BP 03/12/2011  . Screening for cervical cancer 03/21/2011  . Tachycardia 03/21/2011  . Breast mass, left 03/21/2011  . Allergic state 04/12/2011  . SOM (serous otitis media) 04/12/2011  . Perimenopausal 10/05/2012    Past Surgical History  Procedure Laterality Date  . Laceration repair      age 23, dog attack, stitches in face, multipe sites right side of face    Family History  Problem Relation Age of Onset  . Heart attack Father 39  . Hypertension Father   . Hyperlipidemia Father   . Heart disease Father 67    MI  . Hypertension Sister   . Ovarian cysts Sister   . Heart attack Brother 43  . Hypertension Brother   . Heart disease Brother   . Anxiety disorder Daughter   . OCD Daughter   . Heart attack Maternal Grandmother   . Hypertension Maternal Grandmother   . Cancer Maternal Grandfather     skin, bcc  . Glaucoma Maternal Grandfather   . Stroke Paternal Grandfather   . Cancer Mother     skin, bcc, scc  . Heart disease Mother     arrythmia     History   Social History  . Marital Status: Married    Spouse Name: N/A    Number of Children: N/A  . Years of Education: N/A   Occupational History  . Not on file.   Social History Main Topics  . Smoking status: Never Smoker   . Smokeless tobacco: Never Used  . Alcohol Use: No  . Drug Use: No  . Sexual Activity: Yes    Partners: Male   Other Topics Concern  . Not on file   Social History Narrative  . No narrative on file    Current Outpatient Prescriptions on File Prior to Visit  Medication Sig Dispense Refill  . aspirin 81 MG tablet Take 81 mg by mouth daily.      . Calcium-Cholecalciferol (VITA-CALCIUM PO) Take by mouth daily. 1 chewable      . Calcium-Magnesium-Vitamin D (CITRACAL CALCIUM+D PO) Take by mouth daily.      . Ferrous Gluconate (IRON) 240 (27 FE) MG TABS Take 1 tablet by mouth daily.      . fish oil-omega-3 fatty acids 1000 MG capsule Take 1 g by mouth daily.       . Flaxseed Oil OIL Take 1,400 mg by mouth daily.      Marland Kitchen  latanoprost (XALATAN) 0.005 % ophthalmic solution Place 1 drop into both eyes at bedtime.      . multivitamin-iron-minerals-folic acid (CENTRUM) chewable tablet Chew 1 tablet by mouth daily.      . psyllium (METAMUCIL) 58.6 % packet Take 1 packet by mouth daily.       No current facility-administered medications on file prior to visit.    Allergies  Allergen Reactions  . Penicillins Rash  . Clindamycin Hcl Rash    Review of Systems  Review of Systems  Constitutional: Negative for fever and malaise/fatigue.  HENT: Negative for congestion.   Eyes: Negative for discharge.  Respiratory: Negative for shortness of breath.   Cardiovascular: Negative for chest pain, palpitations and leg swelling.  Gastrointestinal: Negative for nausea, abdominal pain and diarrhea.  Genitourinary: Negative for dysuria.  Musculoskeletal: Negative for falls.  Skin: Negative for rash.  Neurological: Negative for loss of consciousness and headaches.   Endo/Heme/Allergies: Negative for polydipsia.  Psychiatric/Behavioral: Negative for depression and suicidal ideas. The patient is not nervous/anxious and does not have insomnia.     Objective  BP 122/80  Pulse 89  Temp(Src) 98.1 F (36.7 C) (Oral)  Resp 16  Ht 5' 4.25" (1.632 m)  Wt 147 lb (66.679 kg)  BMI 25.04 kg/m2  SpO2 99%  Physical Exam  Physical Exam  Constitutional: She is oriented to person, place, and time and well-developed, well-nourished, and in no distress. No distress.  HENT:  Head: Normocephalic and atraumatic.  Eyes: Conjunctivae are normal.  Neck: Neck supple. No thyromegaly present.  Cardiovascular: Normal rate, regular rhythm and normal heart sounds.   No murmur heard. Pulmonary/Chest: Effort normal and breath sounds normal. She has no wheezes.  Abdominal: She exhibits no distension and no mass.  Musculoskeletal: She exhibits no edema.  Lymphadenopathy:    She has no cervical adenopathy.  Neurological: She is alert and oriented to person, place, and time.  Skin: Skin is warm and dry. No rash noted. She is not diaphoretic.  Psychiatric: Memory, affect and judgment normal.    Lab Results  Component Value Date   TSH 1.22 03/26/2012   Lab Results  Component Value Date   WBC 4.8 03/26/2012   HGB 12.8 03/26/2012   HCT 39.0 03/26/2012   MCV 94.4 03/26/2012   PLT 165.0 03/26/2012   Lab Results  Component Value Date   CREATININE 0.8 03/26/2012   BUN 16 03/26/2012   NA 139 03/26/2012   K 4.3 03/26/2012   CL 103 03/26/2012   CO2 26 03/26/2012   Lab Results  Component Value Date   ALT 15 03/26/2012   AST 22 03/26/2012   ALKPHOS 63 03/26/2012   BILITOT 0.7 03/26/2012   Lab Results  Component Value Date   CHOL 253* 09/23/2012   Lab Results  Component Value Date   HDL 88 09/23/2012   Lab Results  Component Value Date   LDLCALC 149* 09/23/2012   Lab Results  Component Value Date   TRIG 78 09/23/2012   Lab Results  Component Value Date   CHOLHDL 2.9  09/23/2012     Assessment & Plan  Perimenopausal Feeling well no menses in December 2013.   Palpitations No recent concerns.   Elevated BP Good  Numbers otday, no changes  Anemia resolved  Allergic state No symptoms presently, may use meds prn  Hyperlipidemia Still concerning. Avoid trans fats, add krill oil, increase exercise.asked to consider a statin

## 2012-10-05 NOTE — Assessment & Plan Note (Signed)
Feeling well no menses in December 2013.

## 2012-10-05 NOTE — Assessment & Plan Note (Addendum)
Still concerning. Avoid trans fats, add krill oil, increase exercise.asked to consider a statin

## 2012-10-05 NOTE — Assessment & Plan Note (Signed)
resolved 

## 2012-10-05 NOTE — Assessment & Plan Note (Signed)
No recent concerns. 

## 2012-10-05 NOTE — Assessment & Plan Note (Signed)
No symptoms presently, may use meds prn

## 2012-10-05 NOTE — Assessment & Plan Note (Signed)
Good  Numbers otday, no changes

## 2013-03-31 ENCOUNTER — Other Ambulatory Visit: Payer: Self-pay | Admitting: Family Medicine

## 2013-03-31 LAB — TSH: TSH: 1.443 u[IU]/mL (ref 0.350–4.500)

## 2013-03-31 LAB — LIPID PANEL
CHOL/HDL RATIO: 2.8 ratio
Cholesterol: 260 mg/dL — ABNORMAL HIGH (ref 0–200)
HDL: 94 mg/dL (ref >39)
LDL Cholesterol: 153 mg/dL — ABNORMAL HIGH (ref 0–99)
Triglycerides: 65 mg/dL (ref <150)
VLDL: 13 mg/dL (ref 0–40)

## 2013-03-31 LAB — HEPATIC FUNCTION PANEL
ALK PHOS: 72 U/L (ref 39–117)
ALT: 13 U/L (ref 0–35)
AST: 22 U/L (ref 0–37)
Albumin: 4.5 g/dL (ref 3.5–5.2)
BILIRUBIN INDIRECT: 0.5 mg/dL (ref 0.2–1.2)
BILIRUBIN TOTAL: 0.6 mg/dL (ref 0.2–1.2)
Bilirubin, Direct: 0.1 mg/dL (ref 0.0–0.3)
Total Protein: 7.2 g/dL (ref 6.0–8.3)

## 2013-03-31 LAB — CBC
HCT: 40.4 % (ref 36.0–46.0)
HEMOGLOBIN: 13.5 g/dL (ref 12.0–15.0)
MCH: 30.8 pg (ref 26.0–34.0)
MCHC: 33.4 g/dL (ref 30.0–36.0)
MCV: 92 fL (ref 78.0–100.0)
Platelets: 203 10*3/uL (ref 150–400)
RBC: 4.39 MIL/uL (ref 3.87–5.11)
RDW: 13 % (ref 11.5–15.5)
WBC: 4.9 10*3/uL (ref 4.0–10.5)

## 2013-03-31 LAB — RENAL FUNCTION PANEL
ALBUMIN: 4.5 g/dL (ref 3.5–5.2)
BUN: 17 mg/dL (ref 6–23)
CHLORIDE: 99 meq/L (ref 96–112)
CO2: 30 meq/L (ref 19–32)
Calcium: 9.7 mg/dL (ref 8.4–10.5)
Creat: 0.93 mg/dL (ref 0.50–1.10)
Glucose, Bld: 96 mg/dL (ref 70–99)
Phosphorus: 3.7 mg/dL (ref 2.3–4.6)
Potassium: 4.1 mEq/L (ref 3.5–5.3)
SODIUM: 140 meq/L (ref 135–145)

## 2013-04-07 ENCOUNTER — Ambulatory Visit (INDEPENDENT_AMBULATORY_CARE_PROVIDER_SITE_OTHER): Payer: BC Managed Care – PPO | Admitting: Family Medicine

## 2013-04-07 ENCOUNTER — Telehealth: Payer: Self-pay | Admitting: Family Medicine

## 2013-04-07 ENCOUNTER — Encounter: Payer: Self-pay | Admitting: Family Medicine

## 2013-04-07 VITALS — BP 138/80 | HR 92 | Temp 98.2°F | Ht 64.25 in | Wt 147.1 lb

## 2013-04-07 DIAGNOSIS — R030 Elevated blood-pressure reading, without diagnosis of hypertension: Secondary | ICD-10-CM

## 2013-04-07 DIAGNOSIS — E785 Hyperlipidemia, unspecified: Secondary | ICD-10-CM

## 2013-04-07 DIAGNOSIS — D649 Anemia, unspecified: Secondary | ICD-10-CM

## 2013-04-07 DIAGNOSIS — R002 Palpitations: Secondary | ICD-10-CM

## 2013-04-07 DIAGNOSIS — IMO0001 Reserved for inherently not codable concepts without codable children: Secondary | ICD-10-CM

## 2013-04-07 DIAGNOSIS — R32 Unspecified urinary incontinence: Secondary | ICD-10-CM

## 2013-04-07 DIAGNOSIS — Z23 Encounter for immunization: Secondary | ICD-10-CM

## 2013-04-07 DIAGNOSIS — R739 Hyperglycemia, unspecified: Secondary | ICD-10-CM

## 2013-04-07 DIAGNOSIS — Z1211 Encounter for screening for malignant neoplasm of colon: Secondary | ICD-10-CM

## 2013-04-07 MED ORDER — TETANUS-DIPHTH-ACELL PERTUSSIS 5-2.5-18.5 LF-MCG/0.5 IM SUSP: 0.5000 mL | Freq: Once | INTRAMUSCULAR | Status: DC

## 2013-04-07 NOTE — Patient Instructions (Signed)

## 2013-04-07 NOTE — Telephone Encounter (Signed)
Lab order placed.

## 2013-04-07 NOTE — Telephone Encounter (Signed)
Lab order week of 09-28-2013 Return in about 6 months (around 10/08/2013) for annual exam, lipid, renal, cbc, tsh, hepatic prior. Diagnostic follow-up:

## 2013-04-07 NOTE — Progress Notes (Signed)
Pre visit review using our clinic review tool, if applicable. No additional management support is needed unless otherwise documented below in the visit note. 

## 2013-04-08 LAB — URINALYSIS
Bilirubin Urine: NEGATIVE
Glucose, UA: NEGATIVE mg/dL
Hgb urine dipstick: NEGATIVE
KETONES UR: NEGATIVE mg/dL
Leukocytes, UA: NEGATIVE
NITRITE: NEGATIVE
PH: 7 (ref 5.0–8.0)
Protein, ur: NEGATIVE mg/dL
SPECIFIC GRAVITY, URINE: 1.012 (ref 1.005–1.030)
Urobilinogen, UA: 0.2 mg/dL (ref 0.0–1.0)

## 2013-04-08 LAB — URINE CULTURE
Colony Count: NO GROWTH
ORGANISM ID, BACTERIA: NO GROWTH

## 2013-04-12 ENCOUNTER — Encounter: Payer: Self-pay | Admitting: Family Medicine

## 2013-04-12 DIAGNOSIS — R32 Unspecified urinary incontinence: Secondary | ICD-10-CM | POA: Insufficient documentation

## 2013-04-12 NOTE — Assessment & Plan Note (Signed)
Resolved on recent labs.

## 2013-04-12 NOTE — Assessment & Plan Note (Signed)
Adequate control, given Tdap today

## 2013-04-12 NOTE — Assessment & Plan Note (Signed)
No recent episodes

## 2013-04-12 NOTE — Progress Notes (Signed)
**Note Maria-Identified via Obfuscation** Patient ID: Maria Lucas, female   DOB: 07/16/1960, 53 y.o.   MRN: [id number] Maria Lucas [id number] 07/14/1960 04/12/2013      Progress Note-Follow Up  Subjective  Chief Complaint  Chief Complaint  Patient presents with  . Follow-up    6 week  . Injections    tdap    HPI  Patient is a 53 year old Caucasian female who is in today for followup. Feeling well. No recent illness. Denies headaches, congestion, chest pain, palpitations, shortness of breath, GI complaints. Does not some recent increasing urinary incontinence. Usually with position changes she will eat a small amount of fluid also noted some with coughing and sneezing. No dysuria or hematuria  Past Medical History  Diagnosis Date  . Chicken pox as a child  . History of UTI   . Palpitations   . Chest pain 03/12/2011  . Glaucoma 03/12/2011  . Anemia     menorraghia  . Elevated antinuclear antibody (ANA) level 03/12/2011  . Hyperlipidemia 03/12/2011  . Hyperglycemia 03/12/2011  . Elevated BP 03/12/2011  . Screening for cervical cancer 03/21/2011  . Tachycardia 03/21/2011  . Breast mass, left 03/21/2011  . Allergic state 04/12/2011  . SOM (serous otitis media) 04/12/2011  . Perimenopausal 10/05/2012    Past Surgical History  Procedure Laterality Date  . Laceration repair      age 53, dog attack, stitches in face, multipe sites right side of face    Family History  Problem Relation Age of Onset  . Heart attack Father [id number]  . Hypertension Father   . Hyperlipidemia Father   . Heart disease Father [id number]    MI  . Hypertension Sister   . Ovarian cysts Sister   . Heart attack Brother 43  . Hypertension Brother   . Heart disease Brother   . Anxiety disorder Daughter   . OCD Daughter   . Heart attack Maternal Grandmother   . Hypertension Maternal Grandmother   . Cancer Maternal Grandfather     skin, bcc  . Glaucoma Maternal Grandfather   . Stroke Paternal Grandfather   . Cancer Mother     skin, bcc, scc  . Heart disease Mother      arrythmia    History   Social History  . Marital Status: Married    Spouse Name: N/A    Number of Children: N/A  . Years of Education: N/A   Occupational History  . Not on file.   Social History Main Topics  . Smoking status: Never Smoker   . Smokeless tobacco: Never Used  . Alcohol Use: No  . Drug Use: No  . Sexual Activity: Yes    Partners: Male   Other Topics Concern  . Not on file   Social History Narrative  . No narrative on file    Current Outpatient Prescriptions on File Prior to Visit  Medication Sig Dispense Refill  . aspirin 81 MG tablet Take 81 mg by mouth daily.      . Calcium-Cholecalciferol (VITA-CALCIUM PO) Take by mouth daily. 1 chewable      . Calcium-Magnesium-Vitamin D (CITRACAL CALCIUM+D PO) Take by mouth daily.      . Ferrous Gluconate (IRON) 240 (27 FE) MG TABS Take 1 tablet by mouth daily.      . fish oil-omega-3 fatty acids 1000 MG capsule Take 1 g by mouth daily.       . Flaxseed Oil OIL Take 1,400 mg by mouth daily.      Marland Kitchen. latanoprost (  XALATAN) 0.005 % ophthalmic solution Place 1 drop into both eyes at bedtime.      . multivitamin-iron-minerals-folic acid (CENTRUM) chewable tablet Chew 1 tablet by mouth daily.      Marland Kitchen loratadine (CLARITIN) 10 MG tablet Take 1 tablet (10 mg total) by mouth daily.  30 tablet  11   No current facility-administered medications on file prior to visit.    Allergies  Allergen Reactions  . Penicillins Rash  . Clindamycin Hcl Rash    Review of Systems  Review of Systems  Constitutional: Negative for fever and malaise/fatigue.  HENT: Negative for congestion.   Eyes: Negative for discharge.  Respiratory: Negative for shortness of breath.   Cardiovascular: Negative for chest pain, palpitations and leg swelling.  Gastrointestinal: Negative for nausea, abdominal pain and diarrhea.  Genitourinary: Negative for dysuria, urgency, frequency, hematuria and flank pain.  Musculoskeletal: Negative for falls and  myalgias.  Skin: Negative for rash.  Neurological: Negative for loss of consciousness and headaches.  Endo/Heme/Allergies: Negative for polydipsia.  Psychiatric/Behavioral: Negative for depression and suicidal ideas. The patient is not nervous/anxious and does not have insomnia.     Objective  BP 138/80  Pulse 92  Temp(Src) 98.2 F (36.8 C) (Oral)  Ht 5' 4.25" (1.632 m)  Wt 147 lb 1.3 oz (66.715 kg)  BMI 25.05 kg/m2  SpO2 99%  LMP 11/07/2012  Physical Exam  Physical Exam  Constitutional: She is oriented to person, place, and time and well-developed, well-nourished, and in no distress. No distress.  HENT:  Head: Normocephalic and atraumatic.  Eyes: Conjunctivae are normal.  Neck: Neck supple. No thyromegaly present.  Cardiovascular: Normal rate, regular rhythm and normal heart sounds.   No murmur heard. Pulmonary/Chest: Effort normal and breath sounds normal. She has no wheezes.  Abdominal: She exhibits no distension and no mass.  Musculoskeletal: She exhibits no edema.  Lymphadenopathy:    She has no cervical adenopathy.  Neurological: She is alert and oriented to person, place, and time.  Skin: Skin is warm and dry. No rash noted. She is not diaphoretic.  Psychiatric: Memory, affect and judgment normal.    Lab Results  Component Value Date   TSH 1.443 03/31/2013   Lab Results  Component Value Date   WBC 4.9 03/31/2013   HGB 13.5 03/31/2013   HCT 40.4 03/31/2013   MCV 92.0 03/31/2013   PLT 203 03/31/2013   Lab Results  Component Value Date   CREATININE 0.93 03/31/2013   BUN 17 03/31/2013   NA 140 03/31/2013   K 4.1 03/31/2013   CL 99 03/31/2013   CO2 30 03/31/2013   Lab Results  Component Value Date   ALT 13 03/31/2013   AST 22 03/31/2013   ALKPHOS 72 03/31/2013   BILITOT 0.6 03/31/2013   Lab Results  Component Value Date   CHOL 260* 03/31/2013   Lab Results  Component Value Date   HDL 94 03/31/2013   Lab Results  Component Value Date   LDLCALC 153*  03/31/2013   Lab Results  Component Value Date   TRIG 65 03/31/2013   Lab Results  Component Value Date   CHOLHDL 2.8 03/31/2013     Assessment & Plan  Anemia Resolved on recent labs  Urinary incontinence Encouraged Kegel exercises bid, urinalysis unremarkable, consider referral to urology for further evaluation if symptoms persists  Palpitations No recent episodes  Elevated BP Adequate control, given Tdap today

## 2013-04-12 NOTE — Assessment & Plan Note (Signed)
Encouraged Kegel exercises bid, urinalysis unremarkable, consider referral to urology for further evaluation if symptoms persists

## 2013-04-15 ENCOUNTER — Encounter: Payer: Self-pay | Admitting: Internal Medicine

## 2013-06-05 ENCOUNTER — Ambulatory Visit (AMBULATORY_SURGERY_CENTER): Payer: BC Managed Care – PPO | Admitting: *Deleted

## 2013-06-05 VITALS — Ht 64.0 in | Wt 150.0 lb

## 2013-06-05 DIAGNOSIS — Z1211 Encounter for screening for malignant neoplasm of colon: Secondary | ICD-10-CM

## 2013-06-05 MED ORDER — NA SULFATE-K SULFATE-MG SULF 17.5-3.13-1.6 GM/177ML PO SOLN: 1.0000 | Freq: Once | ORAL | Status: DC

## 2013-06-05 NOTE — Progress Notes (Signed)
No egg or soy allergy. When pt was 53 years old they told her she was hard to wake after anesthesia.  No home O2.  No diet meds.

## 2013-06-10 ENCOUNTER — Encounter: Payer: Self-pay | Admitting: Internal Medicine

## 2013-06-19 ENCOUNTER — Encounter: Payer: Self-pay | Admitting: Internal Medicine

## 2013-06-19 ENCOUNTER — Ambulatory Visit (AMBULATORY_SURGERY_CENTER): Payer: BC Managed Care – PPO | Admitting: Internal Medicine

## 2013-06-19 VITALS — BP 102/55 | HR 62 | Temp 97.6°F | Resp 20 | Ht 64.0 in | Wt 150.0 lb

## 2013-06-19 DIAGNOSIS — Z1211 Encounter for screening for malignant neoplasm of colon: Secondary | ICD-10-CM

## 2013-06-19 MED ORDER — SODIUM CHLORIDE 0.9 % IV SOLN: 500.0000 mL | INTRAVENOUS | Status: DC

## 2013-06-19 NOTE — Op Note (Signed)
**Note Maria-Identified via Obfuscation** [facility] [address] [place] [id number]   COLONOSCOPY PROCEDURE REPORT  PATIENT: Maria Lucas, Maria Lucas  MR#: [id number] BIRTHDATE: [birth year][employer][birth year] , 53  yrs. old GENDER: Female ENDOSCOPIST: Iva Booparl E Cherree Conerly, MD, [facility] REFERRED VH:QIONGBY:Stacy Abner GreenspanBlyth, M.D. PROCEDURE DATE:  06/19/2013 PROCEDURE:   Colonoscopy, screening First Screening Colonoscopy - Avg.  risk and is 50 yrs.  old or older Yes.  Prior Negative Screening - Now for repeat screening. N/A  History of Adenoma - Now for follow-up colonoscopy & has been > or = to 3 yrs.  N/A  Polyps Removed Today? No.  Recommend repeat exam, <10 yrs? No. ASA CLASS:   Class II INDICATIONS:average risk screening and first colonoscopy. MEDICATIONS: propofol (Diprivan) 250mg  IV, MAC sedation, administered by CRNA, and These medications were titrated to patient response per physician's verbal order  DESCRIPTION OF PROCEDURE:   After the risks benefits and alternatives of the procedure were thoroughly explained, informed consent was obtained.  A digital rectal exam revealed no abnormalities of the rectum.   The LB [id number]  endoscope was introduced through the anus and advanced to the cecum, which was identified by both the appendix and ileocecal valve. No adverse events experienced.   The quality of the prep was excellent using Suprep  The instrument was then slowly withdrawn as the colon was fully examined.      COLON FINDINGS: A normal appearing cecum, ileocecal valve, and appendiceal orifice were identified.  The ascending, hepatic flexure, transverse, splenic flexure, descending, sigmoid colon and rectum appeared unremarkable.  No polyps or cancers were seen.   A right colon retroflexion was performed.  Retroflexed views revealed no abnormalities. Photo omitted. The time to cecum=2 minutes 27 seconds.  Withdrawal time=7 minutes 50 seconds.  The scope was withdrawn and the procedure completed. COMPLICATIONS: There were no  complications.  ENDOSCOPIC IMPRESSION: Normal colonoscopy - excellent prep - first colonoscopy  RECOMMENDATIONS: Repeat colonoscopy 10 years. 2025   eSigned:  Iva Booparl E Caoilainn Sacks, MD, [facility] 06/19/2013 11:01 AM   cc: The Patient and Reuel DerbyStacy Blyth, MD

## 2013-06-19 NOTE — Progress Notes (Signed)
Stable to RR 

## 2013-06-19 NOTE — Patient Instructions (Addendum)
Your colonoscopy was normal!  I appreciate the opportunity to care for you. Iva Booparl E. Gessner, MD, FACG  YOU HAD AN ENDOSCOPIC PROCEDURE TODAY AT THE [facility]: Refer to the procedure report that was given to you for any specific questions about what was found during the examination.  If the procedure report does not answer your questions, please call your gastroenterologist to clarify.  If you requested that your care partner not be given the details of your procedure findings, then the procedure report has been included in a sealed envelope for you to review at your convenience later.  YOU SHOULD EXPECT: Some feelings of bloating in the abdomen. Passage of more gas than usual.  Walking can help get rid of the air that was put into your GI tract during the procedure and reduce the bloating. If you had a lower endoscopy (such as a colonoscopy or flexible sigmoidoscopy) you may notice spotting of blood in your stool or on the toilet paper. If you underwent a bowel prep for your procedure, then you may not have a normal bowel movement for a few days.  DIET: Your first meal following the procedure should be a light meal and then it is ok to progress to your normal diet.  A half-sandwich or bowl of soup is an example of a good first meal.  Heavy or fried foods are harder to digest and may make you feel nauseous or bloated.  Likewise meals heavy in dairy and vegetables can cause extra gas to form and this can also increase the bloating.  Drink plenty of fluids but you should avoid alcoholic beverages for 24 hours.  ACTIVITY: Your care partner should take you home directly after the procedure.  You should plan to take it easy, moving slowly for the rest of the day.  You can resume normal activity the day after the procedure however you should NOT DRIVE or use heavy machinery for 24 hours (because of the sedation medicines used during the test).    SYMPTOMS TO REPORT IMMEDIATELY: A  gastroenterologist can be reached at any hour.  During normal business hours, 8:30 AM to 5:00 PM Monday through Friday, call [phone].  After hours and on weekends, please call the GI answering service at [phone] who will take a message and have the physician on call contact you.   Following lower endoscopy (colonoscopy or flexible sigmoidoscopy):  Excessive amounts of blood in the stool  Significant tenderness or worsening of abdominal pains  Swelling of the abdomen that is new, acute  Fever of 100F or higher    FOLLOW UP: If any biopsies were taken you will be contacted by phone or by letter within the next 1-3 weeks.  Call your gastroenterologist if you have not heard about the biopsies in 3 weeks.  Our staff will call the home number listed on your records the next business day following your procedure to check on you and address any questions or concerns that you may have at that time regarding the information given to you following your procedure. This is a courtesy call and so if there is no answer at the home number and we have not heard from you through the emergency physician on call, we will assume that you have returned to your regular daily activities without incident.  SIGNATURES/CONFIDENTIALITY: You and/or your care partner have signed paperwork which will be entered into your electronic medical record.  These signatures attest to the fact that that the  information above on your After Visit Summary has been reviewed and is understood.  Full responsibility of the confidentiality of this discharge information lies with you and/or your care-partner.

## 2013-06-22 ENCOUNTER — Telehealth: Payer: Self-pay

## 2013-06-22 NOTE — Telephone Encounter (Signed)
  Follow up Call-  Call back number 06/19/2013  Post procedure Call Back phone  # [phone]  Permission to leave phone message Yes     Patient questions:  Do you have a fever, pain , or abdominal swelling? no Pain Score  0 *  Have you tolerated food without any problems? yes  Have you been able to return to your normal activities? yes  Do you have any questions about your discharge instructions: Diet   no Medications  no Follow up visit  no  Do you have questions or concerns about your Care? no  Actions: * If pain score is 4 or above: No action needed, pain <4.

## 2013-07-07 ENCOUNTER — Encounter: Payer: Self-pay | Admitting: Family Medicine

## 2013-07-07 ENCOUNTER — Ambulatory Visit (INDEPENDENT_AMBULATORY_CARE_PROVIDER_SITE_OTHER): Payer: BC Managed Care – PPO | Admitting: Family Medicine

## 2013-07-07 VITALS — BP 142/80 | HR 87 | Temp 98.2°F | Ht 64.25 in | Wt 149.0 lb

## 2013-07-07 DIAGNOSIS — T7840XA Allergy, unspecified, initial encounter: Secondary | ICD-10-CM

## 2013-07-07 DIAGNOSIS — I8390 Asymptomatic varicose veins of unspecified lower extremity: Secondary | ICD-10-CM

## 2013-07-07 DIAGNOSIS — R000 Tachycardia, unspecified: Secondary | ICD-10-CM

## 2013-07-07 MED ORDER — FLUTICASONE PROPIONATE 50 MCG/ACT NA SUSP: 2.0000 | Freq: Every day | NASAL | Status: DC

## 2013-07-07 NOTE — Patient Instructions (Signed)
Consider Claritin twice daily temporarily and nasal saline bid Try Jobst stockings, on in am off in pm, light weight    Varicose Veins Varicose veins are veins that have become enlarged and twisted. CAUSES This condition is the result of valves in the veins not working properly. Valves in the veins help return blood from the leg to the heart. If these valves are damaged, blood flows backwards and backs up into the veins in the leg near the skin. This causes the veins to become larger. People who are on their feet a lot, who are pregnant, or who are overweight are more likely to develop varicose veins. SYMPTOMS   Bulging, twisted-appearing, bluish veins, most commonly found on the legs.  Leg pain or a feeling of heaviness. These symptoms may be worse at the end of the day.  Leg swelling.  Skin color changes. DIAGNOSIS  Varicose veins can usually be diagnosed with an exam of your legs by your caregiver. He or she may recommend an ultrasound of your leg veins. TREATMENT  Most varicose veins can be treated at home.However, other treatments are available for people who have persistent symptoms or who want to treat the cosmetic appearance of the varicose veins. These include:  Laser treatment of very small varicose veins.  Medicine that is shot (injected) into the vein. This medicine hardens the walls of the vein and closes off the vein. This treatment is called sclerotherapy. Afterwards, you may need to wear clothing or bandages that apply pressure.  Surgery. HOME CARE INSTRUCTIONS   Do not stand or sit in one position for long periods of time. Do not sit with your legs crossed. Rest with your legs raised during the day.  Wear elastic stockings or support hose. Do not wear other tight, encircling garments around the legs, pelvis, or waist.  Walk as much as possible to increase blood flow.  Raise the foot of your bed at night with 2-inch blocks.  If you get a cut in the skin over the  vein and the vein bleeds, lie down with your leg raised and press on it with a clean cloth until the bleeding stops. Then place a bandage (dressing) on the cut. See your caregiver if it continues to bleed or needs stitches. SEEK MEDICAL CARE IF:   The skin around your ankle starts to break down.  You have pain, redness, tenderness, or hard swelling developing in your leg over a vein.  You are uncomfortable due to leg pain. Document Released: 11/01/2004 Document Revised: 04/16/2011 Document Reviewed: 03/20/2010 [facility] Patient Information 2014 [place], [place].

## 2013-07-07 NOTE — Progress Notes (Signed)
Pre visit review using our clinic review tool, if applicable. No additional management support is needed unless otherwise documented below in the visit note. 

## 2013-07-12 ENCOUNTER — Encounter: Payer: Self-pay | Admitting: Family Medicine

## 2013-07-12 DIAGNOSIS — I8390 Asymptomatic varicose veins of unspecified lower extremity: Secondary | ICD-10-CM | POA: Insufficient documentation

## 2013-07-12 NOTE — Assessment & Plan Note (Signed)
Symptomatic recently, encouraged compression hose. Referred to vascular surgery for consideration

## 2013-07-12 NOTE — Assessment & Plan Note (Signed)
Recently flared use Flonase and antihistamines prn

## 2013-07-12 NOTE — Progress Notes (Signed)
Patient ID: [name], female   DOB: 09-05-1960, 53 y.o.   MRN: [id number] Maria Lucas [id number] 1960-11-13 07/12/2013      Progress Note-Follow Up  Subjective  Chief Complaint  Chief Complaint  Patient presents with  . Varicose Veins    both legs and worse in left leg X 4 weeks "blue"vein over left knee cap- pinching, burning and twinging  . Ear Fullness    left ear X 2 week    HPI  Patient is a 53 year old female in today for routine medical care. Patient is in today to discuss varicose veins. She's been exercising more and has had varicosities eyes in both legs. She's had a painful large one over the top of her left knee the past week or 2. She describes a pinching and burning feeling. Is also complaining of some congestion and some crackling in her left ear. Denies CP/palp/SOB/HA/congestion/fevers/GI or GU c/o. Taking meds as prescribed  Past Medical History  Diagnosis Date  . Chicken pox as a child  . History of UTI   . Palpitations   . Chest pain 03/12/2011  . Glaucoma 03/12/2011  . Anemia     menorraghia  . Elevated antinuclear antibody (ANA) level 03/12/2011  . Hyperlipidemia 03/12/2011  . Hyperglycemia 03/12/2011  . Elevated BP 03/12/2011  . Screening for cervical cancer 03/21/2011  . Tachycardia 03/21/2011  . Breast mass, left 03/21/2011  . Allergic state 04/12/2011  . SOM (serous otitis media) 04/12/2011  . Perimenopausal 10/05/2012  . Heart murmur     Past Surgical History  Procedure Laterality Date  . Laceration repair      age 18, dog attack, stitches in face, multipe sites right side of face    Family History  Problem Relation Age of Onset  . Heart attack Father 37  . Hypertension Father   . Hyperlipidemia Father   . Heart disease Father 42    MI  . Hypertension Sister   . Ovarian cysts Sister   . Heart attack Brother 43  . Hypertension Brother   . Heart disease Brother   . Anxiety disorder Daughter   . OCD Daughter   . Heart attack Maternal Grandmother    . Hypertension Maternal Grandmother   . Cancer Maternal Grandfather     skin, bcc  . Glaucoma Maternal Grandfather   . Stroke Paternal Grandfather   . Cancer Mother     skin, bcc, scc  . Heart disease Mother     arrythmia  . Colon cancer Neg Hx     History   Social History  . Marital Status: Married    Spouse Name: N/A    Number of Children: N/A  . Years of Education: N/A   Occupational History  . Not on file.   Social History Main Topics  . Smoking status: Never Smoker   . Smokeless tobacco: Never Used  . Alcohol Use: No  . Drug Use: No  . Sexual Activity: Yes    Partners: Male   Other Topics Concern  . Not on file   Social History Narrative  . No narrative on file    Current Outpatient Prescriptions on File Prior to Visit  Medication Sig Dispense Refill  . aspirin 81 MG tablet Take 81 mg by mouth daily.      . Calcium-Cholecalciferol (VITA-CALCIUM PO) Take by mouth daily. 1 chewable      . Calcium-Magnesium-Vitamin D (CITRACAL CALCIUM+D PO) Take by mouth daily.      Marland Kitchen  Ferrous Gluconate (IRON) 240 (27 FE) MG TABS Take 1 tablet by mouth daily.      . fish oil-omega-3 fatty acids 1000 MG capsule Take 1 g by mouth daily.       . Flaxseed Oil OIL Take 1,400 mg by mouth daily.      Marland Kitchen. latanoprost (XALATAN) 0.005 % ophthalmic solution Place 1 drop into both eyes at bedtime.      . multivitamin-iron-minerals-folic acid (CENTRUM) chewable tablet Chew 1 tablet by mouth daily.      . Probiotic Product (PROBIOTIC PO) Take 2 tablets by mouth.      . psyllium (REGULOID) 0.52 G capsule Take 2 capsules by mouth daily.       No current facility-administered medications on file prior to visit.    Allergies  Allergen Reactions  . Penicillins Rash  . Clindamycin Hcl Rash    Review of Systems  Review of Systems  Constitutional: Negative for fever and malaise/fatigue.  HENT: Negative for congestion.   Eyes: Negative for discharge.  Respiratory: Negative for shortness of  breath.   Cardiovascular: Negative for chest pain, palpitations and leg swelling.  Gastrointestinal: Negative for nausea, abdominal pain and diarrhea.  Genitourinary: Negative for dysuria.  Musculoskeletal: Positive for joint pain. Negative for falls.  Skin: Negative for rash.  Neurological: Negative for loss of consciousness and headaches.  Endo/Heme/Allergies: Negative for polydipsia.  Psychiatric/Behavioral: Negative for depression and suicidal ideas. The patient is not nervous/anxious and does not have insomnia.     Objective  BP 142/80  Pulse 87  Temp(Src) 98.2 F (36.8 C) (Oral)  Ht 5' 4.25" (1.632 m)  Wt 149 lb 0.6 oz (67.604 kg)  BMI 25.38 kg/m2  SpO2 97%  Physical Exam  Physical Exam  Constitutional: She is oriented to person, place, and time and well-developed, well-nourished, and in no distress. No distress.  HENT:  Head: Normocephalic and atraumatic.  Eyes: Conjunctivae are normal.  Neck: Neck supple. No thyromegaly present.  Cardiovascular: Normal rate, regular rhythm and normal heart sounds.   No murmur heard. Varicose vein over left knee and some scattered varicosities b/l legs  Pulmonary/Chest: Effort normal and breath sounds normal. She has no wheezes.  Abdominal: She exhibits no distension and no mass. There is no guarding.  Musculoskeletal: She exhibits no edema.  Lymphadenopathy:    She has no cervical adenopathy.  Neurological: She is alert and oriented to person, place, and time.  Skin: Skin is warm and dry. No rash noted. She is not diaphoretic.  Psychiatric: Memory, affect and judgment normal.    Lab Results  Component Value Date   TSH 1.443 03/31/2013   Lab Results  Component Value Date   WBC 4.9 03/31/2013   HGB 13.5 03/31/2013   HCT 40.4 03/31/2013   MCV 92.0 03/31/2013   PLT 203 03/31/2013   Lab Results  Component Value Date   CREATININE 0.93 03/31/2013   BUN 17 03/31/2013   NA 140 03/31/2013   K 4.1 03/31/2013   CL 99 03/31/2013   CO2 30  03/31/2013   Lab Results  Component Value Date   ALT 13 03/31/2013   AST 22 03/31/2013   ALKPHOS 72 03/31/2013   BILITOT 0.6 03/31/2013   Lab Results  Component Value Date   CHOL 260* 03/31/2013   Lab Results  Component Value Date   HDL 94 03/31/2013   Lab Results  Component Value Date   LDLCALC 153* 03/31/2013   Lab Results  Component Value Date  TRIG 65 03/31/2013   Lab Results  Component Value Date   CHOLHDL 2.8 03/31/2013     Assessment & Plan  Tachycardia Well controlled today  Allergic state Recently flared use Flonase and antihistamines prn  Varicose vein of leg Symptomatic recently, encouraged compression hose. Referred to vascular surgery for consideration

## 2013-07-12 NOTE — Assessment & Plan Note (Signed)
Well controlled today.

## 2013-07-20 ENCOUNTER — Other Ambulatory Visit: Payer: Self-pay

## 2013-07-20 DIAGNOSIS — Z1231 Encounter for screening mammogram for malignant neoplasm of breast: Secondary | ICD-10-CM

## 2013-07-28 ENCOUNTER — Ambulatory Visit
Admission: RE | Admit: 2013-07-28 | Discharge: 2013-07-28 | Disposition: A | Discharge status: Home / Self Care | Payer: BC Managed Care – PPO | Source: Ambulatory Visit

## 2013-07-28 DIAGNOSIS — Z1231 Encounter for screening mammogram for malignant neoplasm of breast: Secondary | ICD-10-CM

## 2013-07-30 ENCOUNTER — Other Ambulatory Visit: Payer: Self-pay | Admitting: Family Medicine

## 2013-07-30 DIAGNOSIS — R928 Other abnormal and inconclusive findings on diagnostic imaging of breast: Secondary | ICD-10-CM

## 2013-08-03 NOTE — Progress Notes (Signed)
Pt informed and states she goes on July 7th for further testing

## 2013-08-11 ENCOUNTER — Ambulatory Visit
Admission: RE | Admit: 2013-08-11 | Discharge: 2013-08-11 | Disposition: A | Discharge status: Home / Self Care | Payer: BC Managed Care – PPO | Source: Ambulatory Visit | Attending: Family Medicine | Admitting: Family Medicine

## 2013-08-11 ENCOUNTER — Encounter (INDEPENDENT_AMBULATORY_CARE_PROVIDER_SITE_OTHER): Payer: Self-pay

## 2013-08-11 DIAGNOSIS — R928 Other abnormal and inconclusive findings on diagnostic imaging of breast: Secondary | ICD-10-CM

## 2013-08-24 ENCOUNTER — Other Ambulatory Visit: Payer: Self-pay | Admitting: *Deleted

## 2013-08-24 DIAGNOSIS — I83893 Varicose veins of bilateral lower extremities with other complications: Secondary | ICD-10-CM

## 2013-09-04 ENCOUNTER — Encounter: Payer: Self-pay | Admitting: Surgery

## 2013-09-07 ENCOUNTER — Other Ambulatory Visit: Payer: Self-pay | Admitting: Surgery

## 2013-09-07 ENCOUNTER — Ambulatory Visit (HOSPITAL_COMMUNITY)
Admission: RE | Admit: 2013-09-07 | Discharge: 2013-09-07 | Disposition: A | Discharge status: Home / Self Care | Payer: BC Managed Care – PPO | Source: Ambulatory Visit | Attending: Surgery | Admitting: Surgery

## 2013-09-07 ENCOUNTER — Ambulatory Visit (INDEPENDENT_AMBULATORY_CARE_PROVIDER_SITE_OTHER): Payer: BC Managed Care – PPO | Admitting: Surgery

## 2013-09-07 ENCOUNTER — Encounter: Payer: Self-pay | Admitting: Surgery

## 2013-09-07 VITALS — BP 133/66 | HR 80 | Ht 64.25 in | Wt 146.6 lb

## 2013-09-07 DIAGNOSIS — M79606 Pain in leg, unspecified: Secondary | ICD-10-CM

## 2013-09-07 DIAGNOSIS — I8392 Asymptomatic varicose veins of left lower extremity: Secondary | ICD-10-CM | POA: Insufficient documentation

## 2013-09-07 DIAGNOSIS — I83893 Varicose veins of bilateral lower extremities with other complications: Secondary | ICD-10-CM | POA: Insufficient documentation

## 2013-09-07 DIAGNOSIS — M79609 Pain in unspecified limb: Secondary | ICD-10-CM | POA: Diagnosis not present

## 2013-09-07 HISTORY — DX: Varicose veins of bilateral lower extremities with other complications: I83.893

## 2013-09-07 NOTE — Progress Notes (Signed)
Patient name: Maria Lucas MRN: [id number] DOB: 09/08/1960 Sex: female   Referred by: Dr. Abner Greenspan  Reason for referral:  Chief Complaint  Patient presents with  . New Evaluation    vv's     HISTORY OF PRESENT ILLNESS: This is a very pleasant 53 year old female who comes in today regarding concerns over a vein on her left leg.  She states that about 2 months ago while riding her bike approximately 12 miles she noticed a enlargement and a vein over her left knee.  This was associated with pinching and tingling in both legs and even a tension and her heart.  She said it to 4 weeks to calm down.  She doesn't endorse mild swelling in both legs.  There is no history of DVT.  The patient suffers from hypercholesterolemia.  She is currently taking fistula for this and is trying to manage this with diet.  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 Chicken pox as a child  . History of UTI   . Palpitations   . Chest pain 03/12/2011  . Glaucoma 03/12/2011  . Anemia     menorraghia  . Elevated antinuclear antibody (ANA) level 03/12/2011  . Hyperlipidemia 03/12/2011  . Hyperglycemia 03/12/2011  . Elevated BP 03/12/2011  . Screening for cervical cancer 03/21/2011  . Tachycardia 03/21/2011  . Breast mass, left 03/21/2011  . Allergic state 04/12/2011  . SOM (serous otitis media) 04/12/2011  . Perimenopausal 10/05/2012  . Heart murmur     Past Surgical History  Procedure Laterality Date  . Laceration repair      age 59, dog attack, stitches in face, multipe sites right side of face    History   Social History  . Marital Status: Married    Spouse Name: N/A    Number of Children: N/A  . Years of Education: N/A   Occupational History  . Not on file.   Social History Main Topics  . Smoking status: Never Smoker   . Smokeless tobacco: Never Used  . Alcohol Use: No  . Drug Use: No  . Sexual Activity: Yes    Partners: Male   Other Topics  Concern  . Not on file   Social History Narrative  . No narrative on file    Family History  Problem Relation Age of Onset  . Heart attack Father 36  . Hypertension Father   . Hyperlipidemia Father   . Heart disease Father 36    MI  . Varicose Veins Father   . Hypertension Sister   . Ovarian cysts Sister   . Hyperlipidemia Sister   . Heart attack Brother 43  . Hypertension Brother   . Heart disease Brother   . Hyperlipidemia Brother   . Anxiety disorder Daughter   . OCD Daughter   . Heart attack Maternal Grandmother   . Hypertension Maternal Grandmother   . Cancer Maternal Grandfather     skin, bcc  . Glaucoma Maternal Grandfather   . Stroke Paternal Grandfather   . Cancer Mother     skin, bcc, scc  . Heart disease Mother     arrythmia  . Hyperlipidemia Mother   . Colon cancer Neg Hx     Allergies as of 09/07/2013 - Review Complete 09/07/2013  Allergen Reaction Noted  . Penicillins Rash 03/07/2011  . Clindamycin hcl Rash 03/07/2011    Current Outpatient Prescriptions on File Prior to Visit  Medication Sig Dispense Refill  . aspirin 81 MG tablet Take 81 mg by mouth daily.      . Calcium-Cholecalciferol (VITA-CALCIUM PO) Take by mouth daily. 1 chewable      . Calcium-Magnesium-Vitamin D (CITRACAL CALCIUM+D PO) Take by mouth daily.      . Ferrous Gluconate (IRON) 240 (27 FE) MG TABS Take 1 tablet by mouth daily.      . fish oil-omega-3 fatty acids 1000 MG capsule Take 1 g by mouth daily.       . Flaxseed Oil OIL Take 1,400 mg by mouth daily.      Marland Kitchen latanoprost (XALATAN) 0.005 % ophthalmic solution Place 1 drop into both eyes at bedtime.      . multivitamin-iron-minerals-folic acid (CENTRUM) chewable tablet Chew 1 tablet by mouth daily.      . Probiotic Product (PROBIOTIC PO) Take 2 tablets by mouth.      . fluticasone (FLONASE) 50 MCG/ACT nasal spray Place 2 sprays into both nostrils daily.  16 g  6  . psyllium (REGULOID) 0.52 G capsule Take 2 capsules by mouth  daily.       No current facility-administered medications on file prior to visit.     REVIEW OF SYSTEMS: Cardiovascular: No chest pain, chest pressure, palpitations, orthopnea, or dyspnea on exertion. No claudication or rest pain,  positive for pain in legs with walking, leg swelling and varicose veins. Pulmonary: No productive cough, asthma or wheezing. Neurologic: No weakness, paresthesias, aphasia, or amaurosis. No dizziness. Hematologic: No bleeding problems or clotting disorders. Musculoskeletal: No joint pain or joint swelling. Gastrointestinal: No blood in stool or hematemesis Genitourinary: No dysuria or hematuria. Psychiatric:: No history of major depression. Integumentary: No rashes or ulcers. Constitutional: No fever or chills.  PHYSICAL EXAMINATION: General: The patient appears their stated age.  Vital signs are BP 133/66  Pulse 80  Ht 5' 4.25" (1.632 m)  Wt 146 lb 9.6 oz (66.497 kg)  BMI 24.97 kg/m2  SpO2 100% HEENT:  No gross abnormalities Pulmonary: Respirations are non-labored Musculoskeletal: There are no major deformities.   Neurologic: No focal weakness or paresthesias are detected, Skin: There are no ulcer or rashes noted. Psychiatric: The patient has normal affect. Cardiovascular: Palpable pedal pulses bilaterally.  Mild edema bilaterally.  Spider veins bilaterally.  Diagnostic Studies: Venous ultrasound has been ordered and reviewed by myself.  This shows mild deep vein reflux at the common femoral vein and scattered areas of reflux throughout the great and short saphenous vein.  Maximum diameter is 0.64 the saphenofemoral junction.    Assessment:   venous insufficiency, left leg, mild Plan: I discussed the ultrasound findings with the patient today.  I think that she has very mild venous insufficiency.  The vein of concern, which courses over the left knee, is back to its normal size.  No evidence of thrombosis or thrombophlebitis is identified  currently.  Based on her findings today, she may benefit from compression stockings.  I discussed this with her, and told her that if her swelling persisted or got worse that she would need to wear the compression stockings as well as schedule a followup appointment with [place].  In addition I would not give her any restrictions from an activity perspective.  If she does develop the appearance of the vein on her left leg again I would like for her to contact us so that we could see her.  I also encouraged her to take a picture of this.  Presumably, she  would be a candidate for sclerotherapy if this recurs.     Jorge NyV. Wells Nocholas Damaso IV, M.D. [facility] Office: [phone] Pager:  [phone]

## 2013-09-16 ENCOUNTER — Encounter: Payer: Self-pay | Admitting: Family

## 2013-09-16 ENCOUNTER — Ambulatory Visit (INDEPENDENT_AMBULATORY_CARE_PROVIDER_SITE_OTHER): Payer: BC Managed Care – PPO | Admitting: Family

## 2013-09-16 VITALS — BP 170/86 | HR 84 | Temp 98.3°F | Resp 16 | Ht 64.25 in | Wt 147.1 lb

## 2013-09-16 DIAGNOSIS — N3001 Acute cystitis with hematuria: Secondary | ICD-10-CM

## 2013-09-16 DIAGNOSIS — N3000 Acute cystitis without hematuria: Secondary | ICD-10-CM

## 2013-09-16 DIAGNOSIS — N390 Urinary tract infection, site not specified: Secondary | ICD-10-CM | POA: Insufficient documentation

## 2013-09-16 LAB — POCT URINALYSIS DIPSTICK
BILIRUBIN UA: NEGATIVE
Glucose, UA: NEGATIVE
Ketones, UA: NEGATIVE
NITRITE UA: NEGATIVE
PH UA: 6.5
Protein, UA: NEGATIVE
Spec Grav, UA: 1.01
Urobilinogen, UA: 0.2

## 2013-09-16 MED ORDER — CIPROFLOXACIN HCL 500 MG PO TABS: 500.0000 mg | ORAL_TABLET | Freq: Two times a day (BID) | ORAL | Status: DC

## 2013-09-16 NOTE — Patient Instructions (Signed)
Start cipro, call if symptoms worsen or if not improved in 2-3 days.

## 2013-09-16 NOTE — Progress Notes (Signed)
**Note Maria-Identified via Obfuscation** Subjective:    Patient ID: Maria Lucas, female    DOB: 07/01/1960, 53 y.o.   MRN: [id number]  HPI  Maria Lucas is a 53 yr old female with chief complaint of dysuria which began yesterday AM. She also reports trace blood on tissue after urination. Had temp 99 yesterday. Denies significant low back pain.  Non-smoker.   BP Readings from Last 3 Encounters:  09/16/13 170/86  09/07/13 133/66  07/07/13 142/80     Review of Systems    see HPI  Past Medical History  Diagnosis Date  . Chicken pox as a child  . History of UTI   . Palpitations   . Chest pain 03/12/2011  . Glaucoma 03/12/2011  . Anemia     menorraghia  . Elevated antinuclear antibody (ANA) level 03/12/2011  . Hyperlipidemia 03/12/2011  . Hyperglycemia 03/12/2011  . Elevated BP 03/12/2011  . Screening for cervical cancer 03/21/2011  . Tachycardia 03/21/2011  . Breast mass, left 03/21/2011  . Allergic state 04/12/2011  . SOM (serous otitis media) 04/12/2011  . Perimenopausal 10/05/2012  . Heart murmur     History   Social History  . Marital Status: Married    Spouse Name: N/A    Number of Children: N/A  . Years of Education: N/A   Occupational History  . Not on file.   Social History Main Topics  . Smoking status: Never Smoker   . Smokeless tobacco: Never Used  . Alcohol Use: No  . Drug Use: No  . Sexual Activity: Yes    Partners: Male   Other Topics Concern  . Not on file   Social History Narrative  . No narrative on file    Past Surgical History  Procedure Laterality Date  . Laceration repair      age 53, dog attack, stitches in face, multipe sites right side of face    Family History  Problem Relation Age of Onset  . Heart attack Father [id number]  . Hypertension Father   . Hyperlipidemia Father   . Heart disease Father [id number]    MI  . Varicose Veins Father   . Hypertension Sister   . Ovarian cysts Sister   . Hyperlipidemia Sister   . Heart attack Brother 43  . Hypertension Brother   . Heart disease  Brother   . Hyperlipidemia Brother   . Anxiety disorder Daughter   . OCD Daughter   . Heart attack Maternal Grandmother   . Hypertension Maternal Grandmother   . Cancer Maternal Grandfather     skin, bcc  . Glaucoma Maternal Grandfather   . Stroke Paternal Grandfather   . Cancer Mother     skin, bcc, scc  . Heart disease Mother     arrythmia  . Hyperlipidemia Mother   . Colon cancer Neg Hx     Allergies  Allergen Reactions  . Penicillins Rash  . Clindamycin Hcl Rash    Current Outpatient Prescriptions on File Prior to Visit  Medication Sig Dispense Refill  . aspirin 81 MG tablet Take 81 mg by mouth daily.      . Calcium-Cholecalciferol (VITA-CALCIUM PO) Take by mouth daily. 1 chewable      . Calcium-Magnesium-Vitamin D (CITRACAL CALCIUM+D PO) Take by mouth daily.      . Ferrous Gluconate (IRON) 240 (27 FE) MG TABS Take 1 tablet by mouth daily.      . fish oil-omega-3 fatty acids 1000 MG capsule Take 1 g by mouth daily.       .Marland Kitchen  Flaxseed Oil OIL Take 1,400 mg by mouth daily.      Marland Kitchen latanoprost (XALATAN) 0.005 % ophthalmic solution Place 1 drop into both eyes at bedtime.      . multivitamin-iron-minerals-folic acid (CENTRUM) chewable tablet Chew 1 tablet by mouth daily.      . Probiotic Product (PROBIOTIC PO) Take 2 tablets by mouth.      . psyllium (REGULOID) 0.52 G capsule Take 2 capsules by mouth daily.       No current facility-administered medications on file prior to visit.    BP 170/86  Pulse 84  Temp(Src) 98.3 F (36.8 C) (Oral)  Resp 16  Ht 5' 4.25" (1.632 m)  Wt 147 lb 1.3 oz (66.715 kg)  BMI 25.05 kg/m2  SpO2 99%    Objective:   Physical Exam  Constitutional: She is oriented to person, place, and time. She appears well-developed and well-nourished. No distress.  Cardiovascular: Normal rate and regular rhythm.   No murmur heard. Pulmonary/Chest: Effort normal and breath sounds normal. No respiratory distress. She has no wheezes. She has no rales. She  exhibits no tenderness.  Abdominal: There is no CVA tenderness.  Neurological: She is alert and oriented to person, place, and time.  Psychiatric: She has a normal mood and affect. Her behavior is normal. Judgment and thought content normal.          Assessment & Plan:

## 2013-09-16 NOTE — Progress Notes (Signed)
Pre visit review using our clinic review tool, if applicable. No additional management support is needed unless otherwise documented below in the visit note. 

## 2013-09-16 NOTE — Assessment & Plan Note (Signed)
UA notes moderate blood, small leuks. Will send for culture, rx with cipro. Pt to call if symptoms worsen or if symptoms are not improved in 2-3 days.

## 2013-09-17 LAB — URINE CULTURE
Colony Count: NO GROWTH
Organism ID, Bacteria: NO GROWTH

## 2013-09-30 LAB — RENAL FUNCTION PANEL
Albumin: 4.4 g/dL (ref 3.5–5.2)
BUN: 17 mg/dL (ref 6–23)
CALCIUM: 9.2 mg/dL (ref 8.4–10.5)
CHLORIDE: 102 meq/L (ref 96–112)
CO2: 27 mEq/L (ref 19–32)
Creat: 0.8 mg/dL (ref 0.50–1.10)
Glucose, Bld: 89 mg/dL (ref 70–99)
POTASSIUM: 3.9 meq/L (ref 3.5–5.3)
Phosphorus: 3.5 mg/dL (ref 2.3–4.6)
Sodium: 137 mEq/L (ref 135–145)

## 2013-09-30 LAB — LIPID PANEL
CHOL/HDL RATIO: 3.2 ratio
CHOLESTEROL: 240 mg/dL — AB (ref 0–200)
HDL: 76 mg/dL (ref >39)
LDL Cholesterol: 145 mg/dL — ABNORMAL HIGH (ref 0–99)
Triglycerides: 96 mg/dL (ref <150)
VLDL: 19 mg/dL (ref 0–40)

## 2013-09-30 LAB — HEPATIC FUNCTION PANEL
ALK PHOS: 68 U/L (ref 39–117)
ALT: 11 U/L (ref 0–35)
AST: 20 U/L (ref 0–37)
Albumin: 4.4 g/dL (ref 3.5–5.2)
BILIRUBIN INDIRECT: 0.5 mg/dL (ref 0.2–1.2)
Bilirubin, Direct: 0.1 mg/dL (ref 0.0–0.3)
Total Bilirubin: 0.6 mg/dL (ref 0.2–1.2)
Total Protein: 6.9 g/dL (ref 6.0–8.3)

## 2013-09-30 LAB — CBC
HCT: 37 % (ref 36.0–46.0)
HEMOGLOBIN: 12.4 g/dL (ref 12.0–15.0)
MCH: 30.3 pg (ref 26.0–34.0)
MCHC: 33.5 g/dL (ref 30.0–36.0)
MCV: 90.5 fL (ref 78.0–100.0)
Platelets: 211 10*3/uL (ref 150–400)
RBC: 4.09 MIL/uL (ref 3.87–5.11)
RDW: 12.6 % (ref 11.5–15.5)
WBC: 5.3 10*3/uL (ref 4.0–10.5)

## 2013-09-30 LAB — TSH: TSH: 1.624 u[IU]/mL (ref 0.350–4.500)

## 2013-10-06 ENCOUNTER — Ambulatory Visit (INDEPENDENT_AMBULATORY_CARE_PROVIDER_SITE_OTHER): Payer: BC Managed Care – PPO | Admitting: Family Medicine

## 2013-10-06 ENCOUNTER — Other Ambulatory Visit (HOSPITAL_COMMUNITY)
Admission: RE | Admit: 2013-10-06 | Discharge: 2013-10-06 | Disposition: A | Discharge status: Home / Self Care | Payer: BC Managed Care – PPO | Source: Ambulatory Visit | Attending: Family Medicine | Admitting: Family Medicine

## 2013-10-06 ENCOUNTER — Encounter: Payer: Self-pay | Admitting: Family Medicine

## 2013-10-06 VITALS — BP 110/80 | HR 92 | Temp 98.2°F | Ht 64.25 in | Wt 146.2 lb

## 2013-10-06 DIAGNOSIS — N760 Acute vaginitis: Secondary | ICD-10-CM | POA: Insufficient documentation

## 2013-10-06 DIAGNOSIS — R7309 Other abnormal glucose: Secondary | ICD-10-CM

## 2013-10-06 DIAGNOSIS — R739 Hyperglycemia, unspecified: Secondary | ICD-10-CM

## 2013-10-06 DIAGNOSIS — N951 Menopausal and female climacteric states: Secondary | ICD-10-CM

## 2013-10-06 DIAGNOSIS — Z0000 Encounter for general adult medical examination without abnormal findings: Secondary | ICD-10-CM

## 2013-10-06 DIAGNOSIS — N390 Urinary tract infection, site not specified: Secondary | ICD-10-CM

## 2013-10-06 DIAGNOSIS — E785 Hyperlipidemia, unspecified: Secondary | ICD-10-CM

## 2013-10-06 DIAGNOSIS — N952 Postmenopausal atrophic vaginitis: Secondary | ICD-10-CM

## 2013-10-06 DIAGNOSIS — R000 Tachycardia, unspecified: Secondary | ICD-10-CM

## 2013-10-06 DIAGNOSIS — IMO0001 Reserved for inherently not codable concepts without codable children: Secondary | ICD-10-CM

## 2013-10-06 DIAGNOSIS — R030 Elevated blood-pressure reading, without diagnosis of hypertension: Secondary | ICD-10-CM

## 2013-10-06 MED ORDER — ESTROGENS, CONJUGATED 0.625 MG/GM VA CREA: TOPICAL_CREAM | VAGINAL | Status: DC

## 2013-10-06 NOTE — Assessment & Plan Note (Signed)
Well controlled, no changes to meds. Encouraged heart healthy diet such as the DASH diet and exercise as tolerated.  °

## 2013-10-06 NOTE — Progress Notes (Signed)
Pre visit review using our clinic review tool, if applicable. No additional management support is needed unless otherwise documented below in the visit note. 

## 2013-10-12 ENCOUNTER — Encounter: Payer: Self-pay | Admitting: Family Medicine

## 2013-10-12 DIAGNOSIS — Z0000 Encounter for general adult medical examination without abnormal findings: Secondary | ICD-10-CM

## 2013-10-12 DIAGNOSIS — N952 Postmenopausal atrophic vaginitis: Secondary | ICD-10-CM | POA: Insufficient documentation

## 2013-10-12 HISTORY — DX: Postmenopausal atrophic vaginitis: N95.2

## 2013-10-12 HISTORY — DX: Encounter for general adult medical examination without abnormal findings: Z00.00

## 2013-10-12 NOTE — Assessment & Plan Note (Signed)
Very mild, minimize caffeine

## 2013-10-12 NOTE — Assessment & Plan Note (Signed)
Last menses October 2014

## 2013-10-12 NOTE — Assessment & Plan Note (Signed)
Encouraged heart healthy diet, increase exercise, avoid trans fats, consider a krill oil cap daily 

## 2013-10-12 NOTE — Assessment & Plan Note (Signed)
Mild dysuria noted but urine culture unremarkable

## 2013-10-12 NOTE — Assessment & Plan Note (Signed)
Using Premarin cream twice weekly with good results, check urinalysis

## 2013-10-12 NOTE — Assessment & Plan Note (Signed)
Patient encouraged to maintain heart healthy diet, regular exercise, adequate sleep. Consider daily probiotics. Take medications as prescribed 

## 2013-10-12 NOTE — Assessment & Plan Note (Signed)
Doing well 

## 2013-10-12 NOTE — Progress Notes (Signed)
Patient ID: [name], female   DOB: 1960/04/10, 53 y.o.   MRN: [id number] Maria Lucas [id number] 07-02-1960 10/12/2013      Progress Note-Follow Up  Subjective  Chief Complaint  Chief Complaint  Patient presents with  . Annual Exam    physical    HPI  Patient is a 53 year old female in today for routine medical care. She is in today for annual exam. Generally done well but does note some dysuria he was waiting. Acknowledges she has thin mucosa and irritation as a result. Reports her knee pain is resolved. No recent illness otherwise. Allergies adequately controlled. No polyuria or polydipsia. Denies CP/palp/SOB/HA/congestion/fevers/GI or GU c/o. Taking meds as prescribed  Past Medical History  Diagnosis Date  . Chicken pox as a child  . History of UTI   . Palpitations   . Chest pain 03/12/2011  . Glaucoma 03/12/2011  . Anemia     menorraghia  . Elevated antinuclear antibody (ANA) level 03/12/2011  . Hyperlipidemia 03/12/2011  . Hyperglycemia 03/12/2011  . Elevated BP 03/12/2011  . Screening for cervical cancer 03/21/2011  . Tachycardia 03/21/2011  . Breast mass, left 03/21/2011  . Allergic state 04/12/2011  . SOM (serous otitis media) 04/12/2011  . Perimenopausal 10/05/2012  . Heart murmur   . Preventative health care 10/12/2013    Past Surgical History  Procedure Laterality Date  . Laceration repair      age 35, dog attack, stitches in face, multipe sites right side of face    Family History  Problem Relation Age of Onset  . Heart attack Father 47  . Hypertension Father   . Hyperlipidemia Father   . Heart disease Father 37    MI  . Varicose Veins Father   . Hypertension Sister   . Ovarian cysts Sister   . Hyperlipidemia Sister   . Heart attack Brother 43  . Hypertension Brother   . Heart disease Brother   . Hyperlipidemia Brother   . Anxiety disorder Daughter   . OCD Daughter   . Heart attack Maternal Grandmother   . Hypertension Maternal Grandmother     valvular  heart disease  . Cancer Maternal Grandfather     skin, bcc  . Glaucoma Maternal Grandfather   . Stroke Paternal Grandfather   . Cancer Mother     skin, bcc, scc  . Heart disease Mother     arrythmia  . Hyperlipidemia Mother   . Colon cancer Neg Hx     History   Social History  . Marital Status: Married    Spouse Name: N/A    Number of Children: N/A  . Years of Education: N/A   Occupational History  . Not on file.   Social History Main Topics  . Smoking status: Never Smoker   . Smokeless tobacco: Never Used  . Alcohol Use: No  . Drug Use: No  . Sexual Activity: Yes    Partners: Male   Other Topics Concern  . Not on file   Social History Narrative  . No narrative on file    Current Outpatient Prescriptions on File Prior to Visit  Medication Sig Dispense Refill  . aspirin 81 MG tablet Take 81 mg by mouth daily.      . Calcium-Cholecalciferol (VITA-CALCIUM PO) Take by mouth daily. 1 chewable      . Calcium-Magnesium-Vitamin D (CITRACAL CALCIUM+D PO) Take by mouth daily.      . Ferrous Gluconate (IRON) 240 (27 FE) MG TABS Take  1 tablet by mouth daily.      . fish oil-omega-3 fatty acids 1000 MG capsule Take 1 g by mouth daily.       . Flaxseed Oil OIL Take 1,400 mg by mouth daily.      Marland Kitchen latanoprost (XALATAN) 0.005 % ophthalmic solution Place 1 drop into both eyes at bedtime.      . multivitamin-iron-minerals-folic acid (CENTRUM) chewable tablet Chew 1 tablet by mouth daily.      . Probiotic Product (PROBIOTIC PO) Take 2 tablets by mouth.      . psyllium (REGULOID) 0.52 G capsule Take 2 capsules by mouth daily.       No current facility-administered medications on file prior to visit.    Allergies  Allergen Reactions  . Penicillins Rash  . Clindamycin Hcl Rash    Review of Systems  Review of Systems  Constitutional: Negative for fever and malaise/fatigue.  HENT: Negative for congestion.   Eyes: Negative for discharge.  Respiratory: Negative for shortness  of breath.   Cardiovascular: Negative for chest pain, palpitations and leg swelling.  Gastrointestinal: Negative for nausea, abdominal pain and diarrhea.  Genitourinary: Positive for dysuria and frequency. Negative for urgency, hematuria and flank pain.  Musculoskeletal: Negative for falls and joint pain.  Skin: Negative for rash.  Neurological: Negative for loss of consciousness and headaches.  Endo/Heme/Allergies: Negative for polydipsia.  Psychiatric/Behavioral: Negative for depression and suicidal ideas. The patient is not nervous/anxious and does not have insomnia.     Objective  BP 110/80  Pulse 92  Temp(Src) 98.2 F (36.8 C) (Oral)  Ht 5' 4.25" (1.632 m)  Wt 146 lb 3.2 oz (66.316 kg)  BMI 24.90 kg/m2  SpO2 99%  Physical Exam  Physical Exam  Constitutional: She is oriented to person, place, and time and well-developed, well-nourished, and in no distress. No distress.  HENT:  Head: Normocephalic and atraumatic.  Right Ear: External ear normal.  Left Ear: External ear normal.  Nose: Nose normal.  Mouth/Throat: Oropharynx is clear and moist. No oropharyngeal exudate.  Eyes: Conjunctivae are normal. Pupils are equal, round, and reactive to light. Right eye exhibits no discharge. Left eye exhibits no discharge. No scleral icterus.  Neck: Normal range of motion. Neck supple. No thyromegaly present.  Cardiovascular: Normal rate, regular rhythm, normal heart sounds and intact distal pulses.   No murmur heard. Pulmonary/Chest: Effort normal and breath sounds normal. No respiratory distress. She has no wheezes. She has no rales.  Abdominal: Soft. Bowel sounds are normal. She exhibits no distension and no mass. There is no tenderness.  Musculoskeletal: Normal range of motion. She exhibits no edema and no tenderness.  Lymphadenopathy:    She has no cervical adenopathy.  Neurological: She is alert and oriented to person, place, and time. She has normal reflexes. No cranial nerve  deficit. Coordination normal.  Skin: Skin is warm and dry. No rash noted. She is not diaphoretic.  Psychiatric: Mood, memory and affect normal.    Lab Results  Component Value Date   TSH 1.624 09/30/2013   Lab Results  Component Value Date   WBC 5.3 09/30/2013   HGB 12.4 09/30/2013   HCT 37.0 09/30/2013   MCV 90.5 09/30/2013   PLT 211 09/30/2013   Lab Results  Component Value Date   CREATININE 0.80 09/30/2013   BUN 17 09/30/2013   NA 137 09/30/2013   K 3.9 09/30/2013   CL 102 09/30/2013   CO2 27 09/30/2013   Lab Results  Component Value Date   ALT 11 09/30/2013   AST 20 09/30/2013   ALKPHOS 68 09/30/2013   BILITOT 0.6 09/30/2013   Lab Results  Component Value Date   CHOL 240* 09/30/2013   Lab Results  Component Value Date   HDL 76 09/30/2013   Lab Results  Component Value Date   LDLCALC 145* 09/30/2013   Lab Results  Component Value Date   TRIG 96 09/30/2013   Lab Results  Component Value Date   CHOLHDL 3.2 09/30/2013     Assessment & Plan  Elevated BP Well controlled, no changes to meds. Encouraged heart healthy diet such as the DASH diet and exercise as tolerated.   Hyperlipidemia Encouraged heart healthy diet, increase exercise, avoid trans fats, consider a krill oil cap daily  Atrophic vaginitis Using Premarin cream twice weekly with good results, check urinalysis  Hyperglycemia Doing well.  Tachycardia Very mild, minimize caffeine  Perimenopausal Last menses October 2014  Preventative health care Patient encouraged to maintain heart healthy diet, regular exercise, adequate sleep. Consider daily probiotics. Take medications as prescribed  UTI (urinary tract infection) Mild dysuria noted but urine culture unremarkable

## 2014-06-14 ENCOUNTER — Ambulatory Visit (INDEPENDENT_AMBULATORY_CARE_PROVIDER_SITE_OTHER): Payer: BLUE CROSS/BLUE SHIELD | Admitting: Family Medicine

## 2014-06-14 ENCOUNTER — Encounter: Payer: Self-pay | Admitting: Family Medicine

## 2014-06-14 VITALS — BP 136/81 | HR 85 | Temp 98.0°F | Ht 64.0 in | Wt 146.4 lb

## 2014-06-14 DIAGNOSIS — R05 Cough: Secondary | ICD-10-CM | POA: Diagnosis not present

## 2014-06-14 DIAGNOSIS — R059 Cough, unspecified: Secondary | ICD-10-CM

## 2014-06-14 DIAGNOSIS — R000 Tachycardia, unspecified: Secondary | ICD-10-CM

## 2014-06-14 DIAGNOSIS — R030 Elevated blood-pressure reading, without diagnosis of hypertension: Secondary | ICD-10-CM

## 2014-06-14 DIAGNOSIS — IMO0001 Reserved for inherently not codable concepts without codable children: Secondary | ICD-10-CM

## 2014-06-14 DIAGNOSIS — T7840XD Allergy, unspecified, subsequent encounter: Secondary | ICD-10-CM | POA: Diagnosis not present

## 2014-06-14 DIAGNOSIS — H6592 Unspecified nonsuppurative otitis media, left ear: Secondary | ICD-10-CM

## 2014-06-14 MED ORDER — MONTELUKAST SODIUM 10 MG PO TABS: 10.0000 mg | ORAL_TABLET | Freq: Every day | ORAL | Status: DC

## 2014-06-14 MED ORDER — FLUTICASONE PROPIONATE 50 MCG/ACT NA SUSP: 2.0000 | Freq: Every day | NASAL | Status: DC

## 2014-06-14 NOTE — Progress Notes (Signed)
Pre visit review using our clinic review tool, if applicable. No additional management support is needed unless otherwise documented below in the visit note. 

## 2014-06-14 NOTE — Patient Instructions (Signed)
Switch probiotic, digestive advantage, or phillips colon health, or order online at [email or web address], NOW companies 10 strain Mucinex twice a day x 10 days Zinc such as Coldeeze, ca/mag/zn tab daily   Allergic Rhinitis Allergic rhinitis is when the mucous membranes in the nose respond to allergens. Allergens are particles in the air that cause your body to have an allergic reaction. This causes you to release allergic antibodies. Through a chain of events, these eventually cause you to release histamine into the blood stream. Although meant to protect the body, it is this release of histamine that causes your discomfort, such as frequent sneezing, congestion, and an itchy, runny nose.  CAUSES  Seasonal allergic rhinitis (hay fever) is caused by pollen allergens that may come from grasses, trees, and weeds. Year-round allergic rhinitis (perennial allergic rhinitis) is caused by allergens such as house dust mites, pet dander, and mold spores.  SYMPTOMS   Nasal stuffiness (congestion).  Itchy, runny nose with sneezing and tearing of the eyes. DIAGNOSIS  Your health care provider can help you determine the allergen or allergens that trigger your symptoms. If you and your health care provider are unable to determine the allergen, skin or blood testing may be used. TREATMENT  Allergic rhinitis does not have a cure, but it can be controlled by:  Medicines and allergy shots (immunotherapy).  Avoiding the allergen. Hay fever may often be treated with antihistamines in pill or nasal spray forms. Antihistamines block the effects of histamine. There are over-the-counter medicines that may help with nasal congestion and swelling around the eyes. Check with your health care provider before taking or giving this medicine.  If avoiding the allergen or the medicine prescribed do not work, there are many new medicines your health care provider can prescribe. Stronger medicine may be used if initial measures  are ineffective. Desensitizing injections can be used if medicine and avoidance does not work. Desensitization is when a patient is given ongoing shots until the body becomes less sensitive to the allergen. Make sure you follow up with your health care provider if problems continue. HOME CARE INSTRUCTIONS It is not possible to completely avoid allergens, but you can reduce your symptoms by taking steps to limit your exposure to them. It helps to know exactly what you are allergic to so that you can avoid your specific triggers. SEEK MEDICAL CARE IF:   You have a fever.  You develop a cough that does not stop easily (persistent).  You have shortness of breath.  You start wheezing.  Symptoms interfere with normal daily activities. Document Released: 10/17/2000 Document Revised: 01/27/2013 Document Reviewed: 09/29/2012 [facility] Patient Information 2015 [place], [place]. This information is not intended to replace advice given to you by your health care provider. Make sure you discuss any questions you have with your health care provider.

## 2014-06-15 LAB — CBC WITH DIFFERENTIAL/PLATELET
Basophils Absolute: 0 10*3/uL (ref 0.0–0.1)
Basophils Relative: 0.4 % (ref 0.0–3.0)
EOS PCT: 0.6 % (ref 0.0–5.0)
Eosinophils Absolute: 0 10*3/uL (ref 0.0–0.7)
HCT: 40.9 % (ref 36.0–46.0)
Hemoglobin: 13.9 g/dL (ref 12.0–15.0)
LYMPHS PCT: 16.4 % (ref 12.0–46.0)
Lymphs Abs: 1.4 10*3/uL (ref 0.7–4.0)
MCHC: 34 g/dL (ref 30.0–36.0)
MCV: 91.3 fl (ref 78.0–100.0)
Monocytes Absolute: 0.3 10*3/uL (ref 0.1–1.0)
Monocytes Relative: 3.5 % (ref 3.0–12.0)
NEUTROS ABS: 6.9 10*3/uL (ref 1.4–7.7)
NEUTROS PCT: 79.1 % — AB (ref 43.0–77.0)
Platelets: 212 10*3/uL (ref 150.0–400.0)
RBC: 4.48 Mil/uL (ref 3.87–5.11)
RDW: 12.6 % (ref 11.5–15.5)
WBC: 8.7 10*3/uL (ref 4.0–10.5)

## 2014-06-15 LAB — COMPREHENSIVE METABOLIC PANEL
ALBUMIN: 4.5 g/dL (ref 3.5–5.2)
ALT: 14 U/L (ref 0–35)
AST: 22 U/L (ref 0–37)
Alkaline Phosphatase: 89 U/L (ref 39–117)
BUN: 15 mg/dL (ref 6–23)
CHLORIDE: 101 meq/L (ref 96–112)
CO2: 27 mEq/L (ref 19–32)
Calcium: 10.1 mg/dL (ref 8.4–10.5)
Creatinine, Ser: 0.91 mg/dL (ref 0.40–1.20)
GFR: 68.69 mL/min (ref >60.00)
GLUCOSE: 105 mg/dL — AB (ref 70–99)
Potassium: 4 mEq/L (ref 3.5–5.1)
Sodium: 139 mEq/L (ref 135–145)
Total Bilirubin: 0.3 mg/dL (ref 0.2–1.2)
Total Protein: 8 g/dL (ref 6.0–8.3)

## 2014-06-27 ENCOUNTER — Encounter: Payer: Self-pay | Admitting: Family Medicine

## 2014-06-27 NOTE — Assessment & Plan Note (Signed)
Cetirizine bid, flonase, nasal saline. Encouraged increased rest and hydration, add probiotics, zinc if symptoms worsen

## 2014-06-27 NOTE — Progress Notes (Signed)
De Maria Lucas  [id number] 02/16/1960 06/27/2014      Progress Note-Follow Up  Subjective  Chief Complaint  Chief Complaint  Patient presents with  . Ear Fullness    HPI  Patient is a 54 y.o. female in today for routine medical care. Patient is in today for evaluation of numerous concerns. She is reporting increased congestion and a sense of her ears being stopped up and mildly uncomfortable. No fevers or chills but she does acknowledge some chest tightness and a scratchy throat with some postnasal drip. Notes some mild coughing and wheezing but no shortness of breath, palpitations or actual chest pain. She denies rhinorrhea, although she does note some very mild head congestion. Has been using Claritin with minimal results. Denies CP/palp/SOB/HA/fevers/GI or GU c/o. Taking meds as prescribed  Past Medical History  Diagnosis Date  . Chicken pox as a child  . History of UTI   . Palpitations   . Chest pain 03/12/2011  . Glaucoma 03/12/2011  . Anemia     menorraghia  . Elevated antinuclear antibody (ANA) level 03/12/2011  . Hyperlipidemia 03/12/2011  . Hyperglycemia 03/12/2011  . Elevated BP 03/12/2011  . Screening for cervical cancer 03/21/2011  . Tachycardia 03/21/2011  . Breast mass, left 03/21/2011  . Allergic state 04/12/2011  . SOM (serous otitis media) 04/12/2011  . Perimenopausal 10/05/2012  . Heart murmur   . Preventative health care 10/12/2013    Past Surgical History  Procedure Laterality Date  . Laceration repair      age 54, dog attack, stitches in face, multipe sites right side of face    Family History  Problem Relation Age of Onset  . Heart attack Father 1749  . Hypertension Father   . Hyperlipidemia Father   . Heart disease Father 2749    MI  . Varicose Veins Father   . Hypertension Sister   . Ovarian cysts Sister   . Hyperlipidemia Sister   . Heart attack Brother 43  . Hypertension Brother   . Heart disease Brother   . Hyperlipidemia Brother   . Anxiety disorder  Daughter   . OCD Daughter   . Heart attack Maternal Grandmother   . Hypertension Maternal Grandmother     valvular heart disease  . Cancer Maternal Grandfather     skin, bcc  . Glaucoma Maternal Grandfather   . Stroke Paternal Grandfather   . Cancer Mother     skin, bcc, scc  . Heart disease Mother     arrythmia  . Hyperlipidemia Mother   . Colon cancer Neg Hx     History   Social History  . Marital Status: Married    Spouse Name: N/A  . Number of Children: N/A  . Years of Education: N/A   Occupational History  . Not on file.   Social History Main Topics  . Smoking status: Never Smoker   . Smokeless tobacco: Never Used  . Alcohol Use: No  . Drug Use: No  . Sexual Activity:    Partners: Male   Other Topics Concern  . Not on file   Social History Narrative    Current Outpatient Prescriptions on File Prior to Visit  Medication Sig Dispense Refill  . aspirin 81 MG tablet Take 81 mg by mouth daily.    . Calcium-Cholecalciferol (VITA-CALCIUM PO) Take by mouth daily. 1 chewable    . Calcium-Magnesium-Vitamin D (CITRACAL CALCIUM+D PO) Take by mouth daily.    . fish oil-omega-3 fatty acids 1000 MG  capsule Take 1 g by mouth daily.     . Flaxseed Oil OIL Take 1,400 mg by mouth daily.    Marland Kitchen latanoprost (XALATAN) 0.005 % ophthalmic solution Place 1 drop into both eyes at bedtime.    . multivitamin-iron-minerals-folic acid (CENTRUM) chewable tablet Chew 1 tablet by mouth daily.    . Probiotic Product (PROBIOTIC PO) Take 2 tablets by mouth.    . psyllium (REGULOID) 0.52 G capsule Take 2 capsules by mouth daily.     No current facility-administered medications on file prior to visit.    Allergies  Allergen Reactions  . Penicillins Rash  . Clindamycin Hcl Rash    Review of Systems  Review of Systems  Constitutional: Negative for fever and malaise/fatigue.  HENT: Positive for congestion, ear pain and sore throat.   Eyes: Negative for discharge.  Respiratory: Positive  for sputum production. Negative for shortness of breath.   Cardiovascular: Negative for chest pain, palpitations and leg swelling.  Gastrointestinal: Negative for nausea, abdominal pain and diarrhea.  Genitourinary: Negative for dysuria.  Musculoskeletal: Negative for falls.  Skin: Negative for rash.  Neurological: Negative for loss of consciousness and headaches.  Endo/Heme/Allergies: Negative for polydipsia.  Psychiatric/Behavioral: Negative for depression and suicidal ideas. The patient is not nervous/anxious and does not have insomnia.     Objective  BP 136/81 mmHg  Pulse 85  Temp(Src) 98 F (36.7 C) (Oral)  Ht  (1.626 m)  Wt 146 lb 6 oz (66.395 kg)  BMI 25.11 kg/m2  SpO2 98%  Physical Exam  Physical Exam  Constitutional: She is oriented to person, place, and time and well-developed, well-nourished, and in no distress. No distress.  HENT:  Head: Normocephalic and atraumatic.  TMs dull and retracted.   Eyes: Conjunctivae are normal.  Neck: Neck supple. No thyromegaly present.  Cardiovascular: Normal rate, regular rhythm and normal heart sounds.   No murmur heard. Pulmonary/Chest: Effort normal and breath sounds normal. She has no wheezes.  Abdominal: She exhibits no distension and no mass.  Musculoskeletal: She exhibits no edema.  Lymphadenopathy:    She has no cervical adenopathy.  Neurological: She is alert and oriented to person, place, and time.  Skin: Skin is warm and dry. No rash noted. She is not diaphoretic.  Psychiatric: Memory, affect and judgment normal.    Lab Results  Component Value Date   TSH 1.624 09/30/2013   Lab Results  Component Value Date   WBC 8.7 06/14/2014   HGB 13.9 06/14/2014   HCT 40.9 06/14/2014   MCV 91.3 06/14/2014   PLT 212.0 06/14/2014   Lab Results  Component Value Date   CREATININE 0.91 06/14/2014   BUN 15 06/14/2014   NA 139 06/14/2014   K 4.0 06/14/2014   CL 101 06/14/2014   CO2 27 06/14/2014   Lab Results    Component Value Date   ALT 14 06/14/2014   AST 22 06/14/2014   ALKPHOS 89 06/14/2014   BILITOT 0.3 06/14/2014   Lab Results  Component Value Date   CHOL 240* 09/30/2013   Lab Results  Component Value Date   HDL 76 09/30/2013   Lab Results  Component Value Date   LDLCALC 145* 09/30/2013   Lab Results  Component Value Date   TRIG 96 09/30/2013   Lab Results  Component Value Date   CHOLHDL 3.2 09/30/2013     Assessment & Plan  Tachycardia RRR today   Elevated BP Well controlled, Encouraged heart healthy diet such as  the DASH diet and exercise as tolerated.    SOM (serous otitis media) Patient encouraged to use Flonase daily, nasal saline bid, Zyrtec daily, valsalva maneuver daily   Allergic state Cetirizine bid, flonase, nasal saline. Encouraged increased rest and hydration, add probiotics, zinc if symptoms worsen

## 2014-06-27 NOTE — Assessment & Plan Note (Signed)
Patient encouraged to use Flonase daily, nasal saline bid, Zyrtec daily, valsalva maneuver daily

## 2014-06-27 NOTE — Assessment & Plan Note (Signed)
Well controlled, Encouraged heart healthy diet such as the DASH diet and exercise as tolerated.  

## 2014-06-27 NOTE — Assessment & Plan Note (Signed)
RRR today 

## 2014-09-22 ENCOUNTER — Telehealth: Payer: Self-pay | Admitting: Family Medicine

## 2014-09-22 NOTE — Telephone Encounter (Signed)
pre visit letter mailed 09/21/14 °

## 2014-10-12 ENCOUNTER — Encounter: Payer: Self-pay | Admitting: Family Medicine

## 2014-10-12 ENCOUNTER — Ambulatory Visit (INDEPENDENT_AMBULATORY_CARE_PROVIDER_SITE_OTHER): Payer: BLUE CROSS/BLUE SHIELD | Admitting: Family Medicine

## 2014-10-12 ENCOUNTER — Other Ambulatory Visit (HOSPITAL_COMMUNITY)
Admission: RE | Admit: 2014-10-12 | Discharge: 2014-10-12 | Disposition: A | Discharge status: Home / Self Care | Payer: BLUE CROSS/BLUE SHIELD | Source: Ambulatory Visit | Attending: Family Medicine | Admitting: Family Medicine

## 2014-10-12 VITALS — BP 122/80 | HR 88 | Temp 98.2°F | Ht 64.0 in | Wt 146.1 lb

## 2014-10-12 DIAGNOSIS — R030 Elevated blood-pressure reading, without diagnosis of hypertension: Secondary | ICD-10-CM

## 2014-10-12 DIAGNOSIS — Z0000 Encounter for general adult medical examination without abnormal findings: Secondary | ICD-10-CM | POA: Diagnosis not present

## 2014-10-12 DIAGNOSIS — Z1239 Encounter for other screening for malignant neoplasm of breast: Secondary | ICD-10-CM | POA: Diagnosis not present

## 2014-10-12 DIAGNOSIS — R000 Tachycardia, unspecified: Secondary | ICD-10-CM

## 2014-10-12 DIAGNOSIS — H6592 Unspecified nonsuppurative otitis media, left ear: Secondary | ICD-10-CM

## 2014-10-12 DIAGNOSIS — E785 Hyperlipidemia, unspecified: Secondary | ICD-10-CM | POA: Diagnosis not present

## 2014-10-12 DIAGNOSIS — Z531 Procedure and treatment not carried out because of patient's decision for reasons of belief and group pressure: Secondary | ICD-10-CM | POA: Insufficient documentation

## 2014-10-12 DIAGNOSIS — Z01419 Encounter for gynecological examination (general) (routine) without abnormal findings: Secondary | ICD-10-CM | POA: Insufficient documentation

## 2014-10-12 DIAGNOSIS — R739 Hyperglycemia, unspecified: Secondary | ICD-10-CM | POA: Diagnosis not present

## 2014-10-12 DIAGNOSIS — IMO0001 Reserved for inherently not codable concepts without codable children: Secondary | ICD-10-CM

## 2014-10-12 DIAGNOSIS — R32 Unspecified urinary incontinence: Secondary | ICD-10-CM

## 2014-10-12 DIAGNOSIS — Z124 Encounter for screening for malignant neoplasm of cervix: Secondary | ICD-10-CM | POA: Diagnosis not present

## 2014-10-12 HISTORY — DX: Procedure and treatment not carried out because of patient's decision for reasons of belief and group pressure: Z53.1

## 2014-10-12 HISTORY — DX: Reserved for inherently not codable concepts without codable children: IMO0001

## 2014-10-12 LAB — COMPREHENSIVE METABOLIC PANEL
ALBUMIN: 4.6 g/dL (ref 3.5–5.2)
ALK PHOS: 72 U/L (ref 39–117)
ALT: 13 U/L (ref 0–35)
AST: 21 U/L (ref 0–37)
BUN: 12 mg/dL (ref 6–23)
CALCIUM: 9.9 mg/dL (ref 8.4–10.5)
CHLORIDE: 103 meq/L (ref 96–112)
CO2: 27 mEq/L (ref 19–32)
CREATININE: 0.85 mg/dL (ref 0.40–1.20)
GFR: 74.22 mL/min (ref >60.00)
Glucose, Bld: 93 mg/dL (ref 70–99)
Potassium: 4.1 mEq/L (ref 3.5–5.1)
Sodium: 142 mEq/L (ref 135–145)
Total Bilirubin: 0.5 mg/dL (ref 0.2–1.2)
Total Protein: 7.5 g/dL (ref 6.0–8.3)

## 2014-10-12 LAB — CBC
HCT: 41.7 % (ref 36.0–46.0)
Hemoglobin: 13.8 g/dL (ref 12.0–15.0)
MCHC: 33.2 g/dL (ref 30.0–36.0)
MCV: 93 fl (ref 78.0–100.0)
PLATELETS: 207 10*3/uL (ref 150.0–400.0)
RBC: 4.49 Mil/uL (ref 3.87–5.11)
RDW: 12.4 % (ref 11.5–15.5)
WBC: 5.6 10*3/uL (ref 4.0–10.5)

## 2014-10-12 LAB — LIPID PANEL
CHOLESTEROL: 287 mg/dL — AB (ref 0–200)
HDL: 90.4 mg/dL (ref >39.00)
LDL CALC: 172 mg/dL — AB (ref 0–99)
NonHDL: 197.05
Total CHOL/HDL Ratio: 3
Triglycerides: 127 mg/dL (ref 0.0–149.0)
VLDL: 25.4 mg/dL (ref 0.0–40.0)

## 2014-10-12 LAB — HEMOGLOBIN A1C: HEMOGLOBIN A1C: 5.7 % (ref 4.6–6.5)

## 2014-10-12 LAB — TSH: TSH: 1.62 u[IU]/mL (ref 0.35–4.50)

## 2014-10-12 NOTE — Progress Notes (Deleted)
**Note Maria-Identified via Obfuscation** Patient ID: Maria Lucas, female   DOB: 09-08-1960, 54 y.o.   MRN: [id number]   Subjective:    Patient ID: Maria Lucas, female    DOB: September 23, 1960, 54 y.o.   MRN: [id number]  Chief Complaint  Patient presents with  . Annual Exam    HPI Patient is in today for ***  Past Medical History  Diagnosis Date  . Chicken pox as a child  . History of UTI   . Palpitations   . Chest pain 03/12/2011  . Glaucoma 03/12/2011  . Anemia     menorraghia  . Elevated antinuclear antibody (ANA) level 03/12/2011  . Hyperlipidemia 03/12/2011  . Hyperglycemia 03/12/2011  . Elevated BP 03/12/2011  . Screening for cervical cancer 03/21/2011  . Tachycardia 03/21/2011  . Breast mass, left 03/21/2011  . Allergic state 04/12/2011  . SOM (serous otitis media) 04/12/2011  . Perimenopausal 10/05/2012  . Heart murmur   . Preventative health care 10/12/2013    Past Surgical History  Procedure Laterality Date  . Laceration repair      age 19, dog attack, stitches in face, multipe sites right side of face    Family History  Problem Relation Age of Onset  . Heart attack Father 69  . Hypertension Father   . Hyperlipidemia Father   . Heart disease Father 18    MI  . Varicose Veins Father   . Hypertension Sister   . Ovarian cysts Sister   . Hyperlipidemia Sister   . Heart attack Brother 43  . Hypertension Brother   . Heart disease Brother   . Hyperlipidemia Brother   . Anxiety disorder Daughter   . OCD Daughter   . Heart attack Maternal Grandmother   . Hypertension Maternal Grandmother     valvular heart disease  . Cancer Maternal Grandfather     skin, bcc  . Glaucoma Maternal Grandfather   . Stroke Paternal Grandfather   . Cancer Mother     skin, bcc, scc  . Heart disease Mother     arrythmia  . Hyperlipidemia Mother   . Colon cancer Neg Hx     Social History   Social History  . Marital Status: Married    Spouse Name: N/A  . Number of Children: N/A  . Years of Education: N/A   Occupational History    . Not on file.   Social History Main Topics  . Smoking status: Never Smoker   . Smokeless tobacco: Never Used  . Alcohol Use: No  . Drug Use: No  . Sexual Activity:    Partners: Male   Other Topics Concern  . Not on file   Social History Narrative    Outpatient Prescriptions Prior to Visit  Medication Sig Dispense Refill  . aspirin 81 MG tablet Take 81 mg by mouth daily.    . Calcium-Cholecalciferol (VITA-CALCIUM PO) Take by mouth daily. 1 chewable    . Calcium-Magnesium-Vitamin D (CITRACAL CALCIUM+D PO) Take by mouth daily.    . fish oil-omega-3 fatty acids 1000 MG capsule Take 1 g by mouth daily.     . Flaxseed Oil OIL Take 1,400 mg by mouth daily.    Marland Kitchen latanoprost (XALATAN) 0.005 % ophthalmic solution Place 1 drop into both eyes at bedtime.    . multivitamin-iron-minerals-folic acid (CENTRUM) chewable tablet Chew 1 tablet by mouth daily.    . Probiotic Product (PROBIOTIC PO) Take 1 tablet by mouth.     . psyllium (REGULOID) 0.52 G capsule Take 2 capsules  by mouth daily.    . fluticasone (FLONASE) 50 MCG/ACT nasal spray Place 2 sprays into both nostrils daily. (Patient not taking: Reported on 10/12/2014) 16 g 3  . montelukast (SINGULAIR) 10 MG tablet Take 1 tablet (10 mg total) by mouth at bedtime. (Patient not taking: Reported on 10/12/2014) 30 tablet 3   No facility-administered medications prior to visit.    Allergies  Allergen Reactions  . Penicillins Rash  . Clindamycin Hcl Rash    Review of Systems  Constitutional: Negative for fever and malaise/fatigue.  HENT: Negative for congestion.   Eyes: Negative for discharge.  Respiratory: Negative for shortness of breath.   Cardiovascular: Negative for chest pain, palpitations and leg swelling.  Gastrointestinal: Negative for nausea and abdominal pain.  Genitourinary: Negative for dysuria.  Musculoskeletal: Negative for falls.  Skin: Negative for rash.  Neurological: Negative for loss of consciousness and headaches.   Endo/Heme/Allergies: Negative for environmental allergies.  Psychiatric/Behavioral: Negative for depression. The patient is not nervous/anxious.        Objective:    Physical Exam  BP 122/80 mmHg  Pulse 88  Temp(Src) 98.2 F (36.8 C) (Oral)  Ht 5\' 4"  (1.626 m)  Wt 146 lb 2 oz (66.282 kg)  BMI 25.07 kg/m2  SpO2 95% Wt Readings from Last 3 Encounters:  10/12/14 146 lb 2 oz (66.282 kg)  06/14/14 146 lb 6 oz (66.395 kg)  10/06/13 146 lb 3.2 oz (66.316 kg)     Lab Results  Component Value Date   WBC 8.7 06/14/2014   HGB 13.9 06/14/2014   HCT 40.9 06/14/2014   PLT 212.0 06/14/2014   GLUCOSE 105* 06/14/2014   CHOL 240* 09/30/2013   TRIG 96 09/30/2013   HDL 76 09/30/2013   LDLDIRECT 122.7 03/26/2012   LDLCALC 145* 09/30/2013   ALT 14 06/14/2014   AST 22 06/14/2014   NA 139 06/14/2014   K 4.0 06/14/2014   CL 101 06/14/2014   CREATININE 0.91 06/14/2014   BUN 15 06/14/2014   CO2 27 06/14/2014   TSH 1.624 09/30/2013    Lab Results  Component Value Date   TSH 1.624 09/30/2013   Lab Results  Component Value Date   WBC 8.7 06/14/2014   HGB 13.9 06/14/2014   HCT 40.9 06/14/2014   MCV 91.3 06/14/2014   PLT 212.0 06/14/2014   Lab Results  Component Value Date   NA 139 06/14/2014   K 4.0 06/14/2014   CO2 27 06/14/2014   GLUCOSE 105* 06/14/2014   BUN 15 06/14/2014   CREATININE 0.91 06/14/2014   BILITOT 0.3 06/14/2014   ALKPHOS 89 06/14/2014   AST 22 06/14/2014   ALT 14 06/14/2014   PROT 8.0 06/14/2014   ALBUMIN 4.5 06/14/2014   CALCIUM 10.1 06/14/2014   GFR 68.69 06/14/2014   Lab Results  Component Value Date   CHOL 240* 09/30/2013   Lab Results  Component Value Date   HDL 76 09/30/2013   Lab Results  Component Value Date   LDLCALC 145* 09/30/2013   Lab Results  Component Value Date   TRIG 96 09/30/2013   Lab Results  Component Value Date   CHOLHDL 3.2 09/30/2013   No results found for: HGBA1C     Assessment & Plan:   Problem List  Items Addressed This Visit    None      I am having Ms. Lipschutz maintain her Flaxseed Oil, multivitamin-iron-minerals-folic acid, Calcium-Magnesium-Vitamin D (CITRACAL CALCIUM+D PO), Calcium-Cholecalciferol (VITA-CALCIUM PO), latanoprost, aspirin, fish oil-omega-3 fatty acids,  psyllium, Probiotic Product (PROBIOTIC PO), montelukast, and fluticasone.  No orders of the defined types were placed in this encounter.     Baird Kay, LPN

## 2014-10-12 NOTE — Assessment & Plan Note (Signed)
RRR today 

## 2014-10-12 NOTE — Progress Notes (Signed)
Pre visit review using our clinic review tool, if applicable. No additional management support is needed unless otherwise documented below in the visit note. 

## 2014-10-12 NOTE — Assessment & Plan Note (Signed)
Not worsening, encouraged kegel exercises, consider referral if worsens

## 2014-10-12 NOTE — Assessment & Plan Note (Signed)
Pap today, no concerns on exam.  

## 2014-10-12 NOTE — Patient Instructions (Addendum)
Zyrtec twice a day to help with allergies. Saline rinse to clear out sinuses and decrease ear discomfort.  Preventive Care for Adults A healthy lifestyle and preventive care can promote health and wellness. Preventive health guidelines for women include the following key practices.  A routine yearly physical is a good way to check with your health care provider about your health and preventive screening. It is a chance to share any concerns and updates on your health and to receive a thorough exam.  Visit your dentist for a routine exam and preventive care every 6 months. Brush your teeth twice a day and floss once a day. Good oral hygiene prevents tooth decay and gum disease.  The frequency of eye exams is based on your age, health, family medical history, use of contact lenses, and other factors. Follow your health care provider's recommendations for frequency of eye exams.  Eat a healthy diet. Foods like vegetables, fruits, whole grains, low-fat dairy products, and lean protein foods contain the nutrients you need without too many calories. Decrease your intake of foods high in solid fats, added sugars, and salt. Eat the right amount of calories for you.Get information about a proper diet from your health care provider, if necessary.  Regular physical exercise is one of the most important things you can do for your health. Most adults should get at least 150 minutes of moderate-intensity exercise (any activity that increases your heart rate and causes you to sweat) each week. In addition, most adults need muscle-strengthening exercises on 2 or more days a week.  Maintain a healthy weight. The body mass index (BMI) is a screening tool to identify possible weight problems. It provides an estimate of body fat based on height and weight. Your health care provider can find your BMI and can help you achieve or maintain a healthy weight.For adults 20 years and older:  A BMI below 18.5 is considered  underweight.  A BMI of 18.5 to 24.9 is normal.  A BMI of 25 to 29.9 is considered overweight.  A BMI of 30 and above is considered obese.  Maintain normal blood lipids and cholesterol levels by exercising and minimizing your intake of saturated fat. Eat a balanced diet with plenty of fruit and vegetables. Blood tests for lipids and cholesterol should begin at age 14 and be repeated every 5 years. If your lipid or cholesterol levels are high, you are over 50, or you are at high risk for heart disease, you may need your cholesterol levels checked more frequently.Ongoing high lipid and cholesterol levels should be treated with medicines if diet and exercise are not working.  If you smoke, find out from your health care provider how to quit. If you do not use tobacco, do not start.  Lung cancer screening is recommended for adults aged 85-80 years who are at high risk for developing lung cancer because of a history of smoking. A yearly low-dose CT scan of the lungs is recommended for people who have at least a 30-pack-year history of smoking and are a current smoker or have quit within the past 15 years. A pack year of smoking is smoking an average of 1 pack of cigarettes a day for 1 year (for example: 1 pack a day for 30 years or 2 packs a day for 15 years). Yearly screening should continue until the smoker has stopped smoking for at least 15 years. Yearly screening should be stopped for people who develop a health problem that would prevent  them from having lung cancer treatment.  If you are pregnant, do not drink alcohol. If you are breastfeeding, be very cautious about drinking alcohol. If you are not pregnant and choose to drink alcohol, do not have more than 1 drink per day. One drink is considered to be 12 ounces (355 mL) of beer, 5 ounces (148 mL) of wine, or 1.5 ounces (44 mL) of liquor.  Avoid use of street drugs. Do not share needles with anyone. Ask for help if you need support or  instructions about stopping the use of drugs.  High blood pressure causes heart disease and increases the risk of stroke. Your blood pressure should be checked at least every 1 to 2 years. Ongoing high blood pressure should be treated with medicines if weight loss and exercise do not work.  If you are 54-34 years old, ask your health care provider if you should take aspirin to prevent strokes.  Diabetes screening involves taking a blood sample to check your fasting blood sugar level. This should be done once every 3 years, after age 14, if you are within normal weight and without risk factors for diabetes. Testing should be considered at a younger age or be carried out more frequently if you are overweight and have at least 1 risk factor for diabetes.  Breast cancer screening is essential preventive care for women. You should practice "breast self-awareness." This means understanding the normal appearance and feel of your breasts and may include breast self-examination. Any changes detected, no matter how small, should be reported to a health care provider. Women in their 27s and 30s should have a clinical breast exam (CBE) by a health care provider as part of a regular health exam every 1 to 3 years. After age 76, women should have a CBE every year. Starting at age 23, women should consider having a mammogram (breast X-ray test) every year. Women who have a family history of breast cancer should talk to their health care provider about genetic screening. Women at a high risk of breast cancer should talk to their health care providers about having an MRI and a mammogram every year.  Breast cancer gene (BRCA)-related cancer risk assessment is recommended for women who have family members with BRCA-related cancers. BRCA-related cancers include breast, ovarian, tubal, and peritoneal cancers. Having family members with these cancers may be associated with an increased risk for harmful changes (mutations) in  the breast cancer genes BRCA1 and BRCA2. Results of the assessment will determine the need for genetic counseling and BRCA1 and BRCA2 testing.  Routine pelvic exams to screen for cancer are no longer recommended for nonpregnant women who are considered low risk for cancer of the pelvic organs (ovaries, uterus, and vagina) and who do not have symptoms. Ask your health care provider if a screening pelvic exam is right for you.  If you have had past treatment for cervical cancer or a condition that could lead to cancer, you need Pap tests and screening for cancer for at least 20 years after your treatment. If Pap tests have been discontinued, your risk factors (such as having a new sexual partner) need to be reassessed to determine if screening should be resumed. Some women have medical problems that increase the chance of getting cervical cancer. In these cases, your health care provider may recommend more frequent screening and Pap tests.  The HPV test is an additional test that may be used for cervical cancer screening. The HPV test looks for the  virus that can cause the cell changes on the cervix. The cells collected during the Pap test can be tested for HPV. The HPV test could be used to screen women aged 76 years and older, and should be used in women of any age who have unclear Pap test results. After the age of 36, women should have HPV testing at the same frequency as a Pap test.  Colorectal cancer can be detected and often prevented. Most routine colorectal cancer screening begins at the age of 56 years and continues through age 15 years. However, your health care provider may recommend screening at an earlier age if you have risk factors for colon cancer. On a yearly basis, your health care provider may provide home test kits to check for hidden blood in the stool. Use of a small camera at the end of a tube, to directly examine the colon (sigmoidoscopy or colonoscopy), can detect the earliest forms  of colorectal cancer. Talk to your health care provider about this at age 76, when routine screening begins. Direct exam of the colon should be repeated every 5-10 years through age 50 years, unless early forms of pre-cancerous polyps or small growths are found.  People who are at an increased risk for hepatitis B should be screened for this virus. You are considered at high risk for hepatitis B if:  You were born in a country where hepatitis B occurs often. Talk with your health care provider about which countries are considered high risk.  Your parents were born in a high-risk country and you have not received a shot to protect against hepatitis B (hepatitis B vaccine).  You have HIV or AIDS.  You use needles to inject street drugs.  You live with, or have sex with, someone who has hepatitis B.  You get hemodialysis treatment.  You take certain medicines for conditions like cancer, organ transplantation, and autoimmune conditions.  Hepatitis C blood testing is recommended for all people born from [birth year] through 1965 and any individual with known risks for hepatitis C.  Practice safe sex. Use condoms and avoid high-risk sexual practices to reduce the spread of sexually transmitted infections (STIs). STIs include gonorrhea, chlamydia, syphilis, trichomonas, herpes, HPV, and human immunodeficiency virus (HIV). Herpes, HIV, and HPV are viral illnesses that have no cure. They can result in disability, cancer, and death.  You should be screened for sexually transmitted illnesses (STIs) including gonorrhea and chlamydia if:  You are sexually active and are younger than 24 years.  You are older than 24 years and your health care provider tells you that you are at risk for this type of infection.  Your sexual activity has changed since you were last screened and you are at an increased risk for chlamydia or gonorrhea. Ask your health care provider if you are at risk.  If you are at risk of  being infected with HIV, it is recommended that you take a prescription medicine daily to prevent HIV infection. This is called preexposure prophylaxis (PrEP). You are considered at risk if:  You are a heterosexual woman, are sexually active, and are at increased risk for HIV infection.  You take drugs by injection.  You are sexually active with a partner who has HIV.  Talk with your health care provider about whether you are at high risk of being infected with HIV. If you choose to begin PrEP, you should first be tested for HIV. You should then be tested every 3 months for as  long as you are taking PrEP.  Osteoporosis is a disease in which the bones lose minerals and strength with aging. This can result in serious bone fractures or breaks. The risk of osteoporosis can be identified using a bone density scan. Women ages 63 years and over and women at risk for fractures or osteoporosis should discuss screening with their health care providers. Ask your health care provider whether you should take a calcium supplement or vitamin D to reduce the rate of osteoporosis.  Menopause can be associated with physical symptoms and risks. Hormone replacement therapy is available to decrease symptoms and risks. You should talk to your health care provider about whether hormone replacement therapy is right for you.  Use sunscreen. Apply sunscreen liberally and repeatedly throughout the day. You should seek shade when your shadow is shorter than you. Protect yourself by wearing long sleeves, pants, a wide-brimmed hat, and sunglasses year round, whenever you are outdoors.  Once a month, do a whole body skin exam, using a mirror to look at the skin on your back. Tell your health care provider of new moles, moles that have irregular borders, moles that are larger than a pencil eraser, or moles that have changed in shape or color.  Stay current with required vaccines (immunizations).  Influenza vaccine. All adults  should be immunized every year.  Tetanus, diphtheria, and acellular pertussis (Td, Tdap) vaccine. Pregnant women should receive 1 dose of Tdap vaccine during each pregnancy. The dose should be obtained regardless of the length of time since the last dose. Immunization is preferred during the 27th-36th week of gestation. An adult who has not previously received Tdap or who does not know her vaccine status should receive 1 dose of Tdap. This initial dose should be followed by tetanus and diphtheria toxoids (Td) booster doses every 10 years. Adults with an unknown or incomplete history of completing a 3-dose immunization series with Td-containing vaccines should begin or complete a primary immunization series including a Tdap dose. Adults should receive a Td booster every 10 years.  Varicella vaccine. An adult without evidence of immunity to varicella should receive 2 doses or a second dose if she has previously received 1 dose. Pregnant females who do not have evidence of immunity should receive the first dose after pregnancy. This first dose should be obtained before leaving the health care facility. The second dose should be obtained 4-8 weeks after the first dose.  Human papillomavirus (HPV) vaccine. Females aged 13-26 years who have not received the vaccine previously should obtain the 3-dose series. The vaccine is not recommended for use in pregnant females. However, pregnancy testing is not needed before receiving a dose. If a female is found to be pregnant after receiving a dose, no treatment is needed. In that case, the remaining doses should be delayed until after the pregnancy. Immunization is recommended for any person with an immunocompromised condition through the age of 30 years if she did not get any or all doses earlier. During the 3-dose series, the second dose should be obtained 4-8 weeks after the first dose. The third dose should be obtained 24 weeks after the first dose and 16 weeks after  the second dose.  Zoster vaccine. One dose is recommended for adults aged 51 years or older unless certain conditions are present.  Measles, mumps, and rubella (MMR) vaccine. Adults born before [birth year] generally are considered immune to measles and mumps. Adults born in [birth year] or later should have 1 or more doses  of MMR vaccine unless there is a contraindication to the vaccine or there is laboratory evidence of immunity to each of the three diseases. A routine second dose of MMR vaccine should be obtained at least 28 days after the first dose for students attending postsecondary schools, health care workers, or international travelers. People who received inactivated measles vaccine or an unknown type of measles vaccine during 1963-1967 should receive 2 doses of MMR vaccine. People who received inactivated mumps vaccine or an unknown type of mumps vaccine before 1979 and are at high risk for mumps infection should consider immunization with 2 doses of MMR vaccine. For females of childbearing age, rubella immunity should be determined. If there is no evidence of immunity, females who are not pregnant should be vaccinated. If there is no evidence of immunity, females who are pregnant should delay immunization until after pregnancy. Unvaccinated health care workers born before [birth year] who lack laboratory evidence of measles, mumps, or rubella immunity or laboratory confirmation of disease should consider measles and mumps immunization with 2 doses of MMR vaccine or rubella immunization with 1 dose of MMR vaccine.  Pneumococcal 13-valent conjugate (PCV13) vaccine. When indicated, a person who is uncertain of her immunization history and has no record of immunization should receive the PCV13 vaccine. An adult aged 28 years or older who has certain medical conditions and has not been previously immunized should receive 1 dose of PCV13 vaccine. This PCV13 should be followed with a dose of pneumococcal polysaccharide (PPSV23)  vaccine. The PPSV23 vaccine dose should be obtained at least 8 weeks after the dose of PCV13 vaccine. An adult aged 74 years or older who has certain medical conditions and previously received 1 or more doses of PPSV23 vaccine should receive 1 dose of PCV13. The PCV13 vaccine dose should be obtained 1 or more years after the last PPSV23 vaccine dose.  Pneumococcal polysaccharide (PPSV23) vaccine. When PCV13 is also indicated, PCV13 should be obtained first. All adults aged 17 years and older should be immunized. An adult younger than age 38 years who has certain medical conditions should be immunized. Any person who resides in a nursing home or long-term care facility should be immunized. An adult smoker should be immunized. People with an immunocompromised condition and certain other conditions should receive both PCV13 and PPSV23 vaccines. People with human immunodeficiency virus (HIV) infection should be immunized as soon as possible after diagnosis. Immunization during chemotherapy or radiation therapy should be avoided. Routine use of PPSV23 vaccine is not recommended for American Indians, [place] Natives, or people younger than 65 years unless there are medical conditions that require PPSV23 vaccine. When indicated, people who have unknown immunization and have no record of immunization should receive PPSV23 vaccine. One-time revaccination 5 years after the first dose of PPSV23 is recommended for people aged 19-64 years who have chronic kidney failure, nephrotic syndrome, asplenia, or immunocompromised conditions. People who received 1-2 doses of PPSV23 before age 72 years should receive another dose of PPSV23 vaccine at age 28 years or later if at least 5 years have passed since the previous dose. Doses of PPSV23 are not needed for people immunized with PPSV23 at or after age 72 years.  Meningococcal vaccine. Adults with asplenia or persistent complement component deficiencies should receive 2 doses of  quadrivalent meningococcal conjugate (MenACWY-D) vaccine. The doses should be obtained at least 2 months apart. Microbiologists working with certain meningococcal bacteria, [place] recruits, people at risk during an outbreak, and people who travel to or  live in countries with a high rate of meningitis should be immunized. A first-year college student up through age 30 years who is living in a residence hall should receive a dose if she did not receive a dose on or after her 16th birthday. Adults who have certain high-risk conditions should receive one or more doses of vaccine.  Hepatitis A vaccine. Adults who wish to be protected from this disease, have certain high-risk conditions, work with hepatitis A-infected animals, work in hepatitis A research labs, or travel to or work in countries with a high rate of hepatitis A should be immunized. Adults who were previously unvaccinated and who anticipate close contact with an international adoptee during the first 60 days after arrival in the [place] from a country with a high rate of hepatitis A should be immunized.  Hepatitis B vaccine. Adults who wish to be protected from this disease, have certain high-risk conditions, may be exposed to blood or other infectious body fluids, are household contacts or sex partners of hepatitis B positive people, are clients or workers in certain care facilities, or travel to or work in countries with a high rate of hepatitis B should be immunized.  Haemophilus influenzae type b (Hib) vaccine. A previously unvaccinated person with asplenia or sickle cell disease or having a scheduled splenectomy should receive 1 dose of Hib vaccine. Regardless of previous immunization, a recipient of a hematopoietic stem cell transplant should receive a 3-dose series 6-12 months after her successful transplant. Hib vaccine is not recommended for adults with HIV infection. Preventive Services / Frequency Ages 73 to 60 years  Blood  pressure check.** / Every 1 to 2 years.  Lipid and cholesterol check.** / Every 5 years beginning at age 45.  Clinical breast exam.** / Every 3 years for women in their 55s and 37s.  BRCA-related cancer risk assessment.** / For women who have family members with a BRCA-related cancer (breast, ovarian, tubal, or peritoneal cancers).  Pap test.** / Every 2 years from ages 42 through 63. Every 3 years starting at age 46 through age 71 or 61 with a history of 3 consecutive normal Pap tests.  HPV screening.** / Every 3 years from ages 65 through ages 10 to 83 with a history of 3 consecutive normal Pap tests.  Hepatitis C blood test.** / For any individual with known risks for hepatitis C.  Skin self-exam. / Monthly.  Influenza vaccine. / Every year.  Tetanus, diphtheria, and acellular pertussis (Tdap, Td) vaccine.** / Consult your health care provider. Pregnant women should receive 1 dose of Tdap vaccine during each pregnancy. 1 dose of Td every 10 years.  Varicella vaccine.** / Consult your health care provider. Pregnant females who do not have evidence of immunity should receive the first dose after pregnancy.  HPV vaccine. / 3 doses over 6 months, if 55 and younger. The vaccine is not recommended for use in pregnant females. However, pregnancy testing is not needed before receiving a dose.  Measles, mumps, rubella (MMR) vaccine.** / You need at least 1 dose of MMR if you were born in 1957 or later. You may also need a 2nd dose. For females of childbearing age, rubella immunity should be determined. If there is no evidence of immunity, females who are not pregnant should be vaccinated. If there is no evidence of immunity, females who are pregnant should delay immunization until after pregnancy.  Pneumococcal 13-valent conjugate (PCV13) vaccine.** / Consult your health care provider.  Pneumococcal polysaccharide (PPSV23)  vaccine.** / 1 to 2 doses if you smoke cigarettes or if you have certain  conditions.  Meningococcal vaccine.** / 1 dose if you are age 62 to 64 years and a Market researcher living in a residence hall, or have one of several medical conditions, you need to get vaccinated against meningococcal disease. You may also need additional booster doses.  Hepatitis A vaccine.** / Consult your health care provider.  Hepatitis B vaccine.** / Consult your health care provider.  Haemophilus influenzae type b (Hib) vaccine.** / Consult your health care provider. Ages 77 to 19 years  Blood pressure check.** / Every 1 to 2 years.  Lipid and cholesterol check.** / Every 5 years beginning at age 80 years.  Lung cancer screening. / Every year if you are aged 46-80 years and have a 30-pack-year history of smoking and currently smoke or have quit within the past 15 years. Yearly screening is stopped once you have quit smoking for at least 15 years or develop a health problem that would prevent you from having lung cancer treatment.  Clinical breast exam.** / Every year after age 19 years.  BRCA-related cancer risk assessment.** / For women who have family members with a BRCA-related cancer (breast, ovarian, tubal, or peritoneal cancers).  Mammogram.** / Every year beginning at age 43 years and continuing for as long as you are in good health. Consult with your health care provider.  Pap test.** / Every 3 years starting at age 21 years through age 42 or 97 years with a history of 3 consecutive normal Pap tests.  HPV screening.** / Every 3 years from ages 5 years through ages 74 to 53 years with a history of 3 consecutive normal Pap tests.  Fecal occult blood test (FOBT) of stool. / Every year beginning at age 37 years and continuing until age 47 years. You may not need to do this test if you get a colonoscopy every 10 years.  Flexible sigmoidoscopy or colonoscopy.** / Every 5 years for a flexible sigmoidoscopy or every 10 years for a colonoscopy beginning at age 34 years  and continuing until age 43 years.  Hepatitis C blood test.** / For all people born from [birth year] through 1964 and any individual with known risks for hepatitis C.  Skin self-exam. / Monthly.  Influenza vaccine. / Every year.  Tetanus, diphtheria, and acellular pertussis (Tdap/Td) vaccine.** / Consult your health care provider. Pregnant women should receive 1 dose of Tdap vaccine during each pregnancy. 1 dose of Td every 10 years.  Varicella vaccine.** / Consult your health care provider. Pregnant females who do not have evidence of immunity should receive the first dose after pregnancy.  Zoster vaccine.** / 1 dose for adults aged 31 years or older.  Measles, mumps, rubella (MMR) vaccine.** / You need at least 1 dose of MMR if you were born in 1957 or later. You may also need a 2nd dose. For females of childbearing age, rubella immunity should be determined. If there is no evidence of immunity, females who are not pregnant should be vaccinated. If there is no evidence of immunity, females who are pregnant should delay immunization until after pregnancy.  Pneumococcal 13-valent conjugate (PCV13) vaccine.** / Consult your health care provider.  Pneumococcal polysaccharide (PPSV23) vaccine.** / 1 to 2 doses if you smoke cigarettes or if you have certain conditions.  Meningococcal vaccine.** / Consult your health care provider.  Hepatitis A vaccine.** / Consult your health care provider.  Hepatitis B vaccine.** /  Consult your health care provider.  Haemophilus influenzae type b (Hib) vaccine.** / Consult your health care provider. Ages 91 years and over  Blood pressure check.** / Every 1 to 2 years.  Lipid and cholesterol check.** / Every 5 years beginning at age 15 years.  Lung cancer screening. / Every year if you are aged 82-80 years and have a 30-pack-year history of smoking and currently smoke or have quit within the past 15 years. Yearly screening is stopped once you have quit smoking  for at least 15 years or develop a health problem that would prevent you from having lung cancer treatment.  Clinical breast exam.** / Every year after age 36 years.  BRCA-related cancer risk assessment.** / For women who have family members with a BRCA-related cancer (breast, ovarian, tubal, or peritoneal cancers).  Mammogram.** / Every year beginning at age 69 years and continuing for as long as you are in good health. Consult with your health care provider.  Pap test.** / Every 3 years starting at age 58 years through age 66 or 38 years with 3 consecutive normal Pap tests. Testing can be stopped between 65 and 70 years with 3 consecutive normal Pap tests and no abnormal Pap or HPV tests in the past 10 years.  HPV screening.** / Every 3 years from ages 68 years through ages 64 or 32 years with a history of 3 consecutive normal Pap tests. Testing can be stopped between 65 and 70 years with 3 consecutive normal Pap tests and no abnormal Pap or HPV tests in the past 10 years.  Fecal occult blood test (FOBT) of stool. / Every year beginning at age 48 years and continuing until age 63 years. You may not need to do this test if you get a colonoscopy every 10 years.  Flexible sigmoidoscopy or colonoscopy.** / Every 5 years for a flexible sigmoidoscopy or every 10 years for a colonoscopy beginning at age 34 years and continuing until age 94 years.  Hepatitis C blood test.** / For all people born from [birth year] through 1964 and any individual with known risks for hepatitis C.  Osteoporosis screening.** / A one-time screening for women ages 10 years and over and women at risk for fractures or osteoporosis.  Skin self-exam. / Monthly.  Influenza vaccine. / Every year.  Tetanus, diphtheria, and acellular pertussis (Tdap/Td) vaccine.** / 1 dose of Td every 10 years.  Varicella vaccine.** / Consult your health care provider.  Zoster vaccine.** / 1 dose for adults aged 31 years or older.  Pneumococcal  13-valent conjugate (PCV13) vaccine.** / Consult your health care provider.  Pneumococcal polysaccharide (PPSV23) vaccine.** / 1 dose for all adults aged 34 years and older.  Meningococcal vaccine.** / Consult your health care provider.  Hepatitis A vaccine.** / Consult your health care provider.  Hepatitis B vaccine.** / Consult your health care provider.  Haemophilus influenzae type b (Hib) vaccine.** / Consult your health care provider. ** Family history and personal history of risk and conditions may change your health care provider's recommendations. Document Released: 03/20/2001 Document Revised: 06/08/2013 Document Reviewed: 06/19/2010 [facility] Patient Information 2015 [place], [place]. This information is not intended to replace advice given to you by your health care provider. Make sure you discuss any questions you have with your health care provider.

## 2014-10-12 NOTE — Assessment & Plan Note (Addendum)
hgba1c acceptable, minimize simple carbs. Increase exercise as tolerated.  

## 2014-10-12 NOTE — Assessment & Plan Note (Signed)
Left leg, mildly symptomatic but tolerable

## 2014-10-12 NOTE — Assessment & Plan Note (Signed)
Has switched to Zyrtec with some improvement encouraged nasal saline qhs

## 2014-10-12 NOTE — Assessment & Plan Note (Signed)
Encouraged heart healthy diet, increase exercise, avoid trans fats, consider a krill oil cap daily 

## 2014-10-13 ENCOUNTER — Telehealth: Payer: Self-pay

## 2014-10-13 LAB — CYTOLOGY - PAP

## 2014-10-13 NOTE — Telephone Encounter (Signed)
She could consider Red Yeast Rice but she has to understand that it really is a statin like Crestor just less regulated. If she decides to proceed with Red Yeast Rice would recommend she get a product from a reputable company such as [employer] or [employer]. If we did Crestor would start with a low dose 5 mg daily and could even start with every other day if she wanted to try that first.

## 2014-10-13 NOTE — Telephone Encounter (Signed)
-----   Message from Bradd Canary, MD sent at 10/12/2014 11:05 PM EDT ----- Notify sugar good, most labs normal unfortunately cholesterol is up again. Encouraged heart healthy diet, increase exercise, avoid trans fats, consider a krill oil cap daily. If patient is willing to would consider a low dose of Crestor.

## 2014-10-13 NOTE — Telephone Encounter (Signed)
Pt notified and was wondering if red yeast rice supplements would be something she could try to lower cholesterol? Said she will consider the Crestor but would also like to know what dose and regimen she would take that.

## 2014-10-14 NOTE — Telephone Encounter (Signed)
Called the patient informed of PCP instructions on Red Yeast Rice and crestor.  The patient did verbalize understanding and at this time will try the Red Yeast Rice.

## 2014-10-14 NOTE — Telephone Encounter (Signed)
Called the patient left message to call back 

## 2014-10-30 NOTE — Assessment & Plan Note (Signed)
Well controlled. Encouraged heart healthy diet such as the DASH diet and exercise as tolerated.  

## 2014-10-30 NOTE — Progress Notes (Signed)
Charl Wellen [id number] [birth year] 10/30/2014      Progress Note New Patient  Subjective  Chief Complaint  Chief Complaint  Patient presents with  . Annual Exam    HPI  Patient is a 54 year old female in today for routine medical care. She is here today for annual exam. Requesting. Acknowledges some stress urinary incontinence noting some leaks with coughing and bending. No dysuria or sign of acute illness. No abdominal or back pain. This has been stable for quite some time. No recent illness. No other acute concerns. Denies CP/palp/SOB/HA/congestion/fevers/GI or GU c/o. Taking meds as prescribed  Past Medical History  Diagnosis Date  . Chicken pox as a child  . History of UTI   . Palpitations   . Chest pain 03/12/2011  . Glaucoma 03/12/2011  . Anemia     menorraghia  . Elevated antinuclear antibody (ANA) level 03/12/2011  . Hyperlipidemia 03/12/2011  . Hyperglycemia 03/12/2011  . Elevated BP 03/12/2011  . Screening for cervical cancer 03/21/2011  . Tachycardia 03/21/2011  . Breast mass, left 03/21/2011  . Allergic state 04/12/2011  . SOM (serous otitis media) 04/12/2011  . Perimenopausal 10/05/2012  . Heart murmur   . Preventative health care 10/12/2013  . Refusal of blood transfusions as patient is Jehovah's Witness 10/12/2014    Past Surgical History  Procedure Laterality Date  . Laceration repair      age 63, dog attack, stitches in face, multipe sites right side of face    Family History  Problem Relation Age of Onset  . Heart attack Father 1  . Hypertension Father   . Hyperlipidemia Father   . Heart disease Father 63    MI  . Varicose Veins Father   . Hypertension Sister   . Ovarian cysts Sister   . Hyperlipidemia Sister   . Heart attack Brother 43  . Hypertension Brother   . Heart disease Brother   . Hyperlipidemia Brother   . Anxiety disorder Daughter   . OCD Daughter   . Heart attack Maternal Grandmother   . Hypertension Maternal Grandmother     valvular heart  disease  . Cancer Maternal Grandfather     skin, bcc  . Glaucoma Maternal Grandfather   . Stroke Paternal Grandfather   . Cancer Mother     skin, bcc, scc  . Heart disease Mother     arrythmia  . Hyperlipidemia Mother   . Colon cancer Neg Hx     Social History   Social History  . Marital Status: Married    Spouse Name: N/A  . Number of Children: N/A  . Years of Education: N/A   Occupational History  . Not on file.   Social History Main Topics  . Smoking status: Never Smoker   . Smokeless tobacco: Never Used  . Alcohol Use: No  . Drug Use: No  . Sexual Activity:    Partners: Male   Other Topics Concern  . Not on file   Social History Narrative    Current Outpatient Prescriptions on File Prior to Visit  Medication Sig Dispense Refill  . aspirin 81 MG tablet Take 81 mg by mouth daily.    . Calcium-Cholecalciferol (VITA-CALCIUM PO) Take by mouth daily. 1 chewable    . Calcium-Magnesium-Vitamin D (CITRACAL CALCIUM+D PO) Take by mouth daily.    . fish oil-omega-3 fatty acids 1000 MG capsule Take 1 g by mouth daily.     . Flaxseed Oil OIL Take 1,400 mg by mouth  daily.    . latanoprost (XALATAN) 0.005 % ophthalmic solution Place 1 drop into both eyes at bedtime.    . multivitamin-iron-minerals-folic acid (CENTRUM) chewable tablet Chew 1 tablet by mouth daily.    . Probiotic Product (PROBIOTIC PO) Take 1 tablet by mouth.     . psyllium (REGULOID) 0.52 G capsule Take 2 capsules by mouth daily.    . fluticasone (FLONASE) 50 MCG/ACT nasal spray Place 2 sprays into both nostrils daily. (Patient not taking: Reported on 10/12/2014) 16 g 3  . montelukast (SINGULAIR) 10 MG tablet Take 1 tablet (10 mg total) by mouth at bedtime. (Patient not taking: Reported on 10/12/2014) 30 tablet 3   No current facility-administered medications on file prior to visit.    Allergies  Allergen Reactions  . Penicillins Rash  . Clindamycin Hcl Rash    Review of Systems  Review of Systems   Constitutional: Negative for fever, chills and malaise/fatigue.  HENT: Negative for congestion and hearing loss.   Eyes: Negative for discharge.  Respiratory: Negative for cough, sputum production and shortness of breath.   Cardiovascular: Negative for chest pain, palpitations and leg swelling.  Gastrointestinal: Negative for heartburn, nausea, vomiting, abdominal pain, diarrhea, constipation and blood in stool.  Genitourinary: Negative for dysuria, urgency, frequency and hematuria.  Musculoskeletal: Negative for myalgias, back pain and falls.  Skin: Negative for rash.  Neurological: Negative for dizziness, sensory change, loss of consciousness, weakness and headaches.  Endo/Heme/Allergies: Negative for environmental allergies. Does not bruise/bleed easily.  Psychiatric/Behavioral: Negative for depression and suicidal ideas. The patient is not nervous/anxious and does not have insomnia.     Objective  BP 122/80 mmHg  Pulse 88  Temp(Src) 98.2 F (36.8 C) (Oral)  Ht  (1.626 m)  Wt 146 lb 2 oz (66.282 kg)  BMI 25.07 kg/m2  SpO2 95%  Physical Exam  Physical Exam  Constitutional: She is oriented to person, place, and time and well-developed, well-nourished, and in no distress. No distress.  HENT:  Head: Normocephalic and atraumatic.  Right Ear: External ear normal.  Left Ear: External ear normal.  Nose: Nose normal.  Mouth/Throat: Oropharynx is clear and moist. No oropharyngeal exudate.  Eyes: Conjunctivae are normal. Pupils are equal, round, and reactive to light. Right eye exhibits no discharge. Left eye exhibits no discharge. No scleral icterus.  Neck: Normal range of motion. Neck supple. No thyromegaly present.  Cardiovascular: Normal rate, regular rhythm, normal heart sounds and intact distal pulses.   No murmur heard. Pulmonary/Chest: Effort normal and breath sounds normal. No respiratory distress. She has no wheezes. She has no rales.  Abdominal: Soft. Bowel sounds  are normal. She exhibits no distension and no mass. There is no tenderness.  Genitourinary: Vagina normal, uterus normal, cervix normal, right adnexa normal and left adnexa normal. No vaginal discharge found.  Musculoskeletal: Normal range of motion. She exhibits no edema or tenderness.  Lymphadenopathy:    She has no cervical adenopathy.  Neurological: She is alert and oriented to person, place, and time. She has normal reflexes. No cranial nerve deficit. Coordination normal.  Skin: Skin is warm and dry. No rash noted. She is not diaphoretic.  Psychiatric: Mood, memory and affect normal.       Assessment & Plan  Hyperglycemia hgba1c acceptable, minimize simple carbs. Increase exercise as tolerated.   Urinary incontinence Not worsening, encouraged kegel exercises, consider referral if worsens  Tachycardia RRR today  SOM (serous otitis media) Has switched to Zyrtec with some improvement encouraged  nasal saline qhs  Varicose veins of lower extremities with other complications Left leg, mildly symptomatic but tolerable  Hyperlipidemia Encouraged heart healthy diet, increase exercise, avoid trans fats, consider a krill oil cap daily  Screening for cervical cancer Pap today, no concerns on exam.

## 2014-10-30 NOTE — Assessment & Plan Note (Signed)
Patient encouraged to maintain heart healthy diet, regular exercise, adequate sleep. Consider daily probiotics. Take medications as prescribed. Given and reviewed copy of ACP documents from [employer] and encouraged to complete and return. Labs reviewed. Pap today

## 2015-03-01 ENCOUNTER — Ambulatory Visit
Admission: RE | Admit: 2015-03-01 | Discharge: 2015-03-01 | Disposition: A | Discharge status: Home / Self Care | Payer: BLUE CROSS/BLUE SHIELD | Source: Ambulatory Visit | Attending: Family Medicine | Admitting: Family Medicine

## 2015-03-01 DIAGNOSIS — Z0000 Encounter for general adult medical examination without abnormal findings: Secondary | ICD-10-CM

## 2015-03-01 DIAGNOSIS — Z1239 Encounter for other screening for malignant neoplasm of breast: Secondary | ICD-10-CM

## 2015-03-01 IMAGING — MG MM SCREENING BREAST TOMO BILATERAL
9 of 15 series · 9 of 35 positions shown · non-contrast
Comparison: Previous Exam(s)

CLINICAL DATA: Screening.

EXAM:
DIGITAL SCREENING BILATERAL MAMMOGRAM WITH 3D TOMO WITH CAD

[R MLO synth-2D]
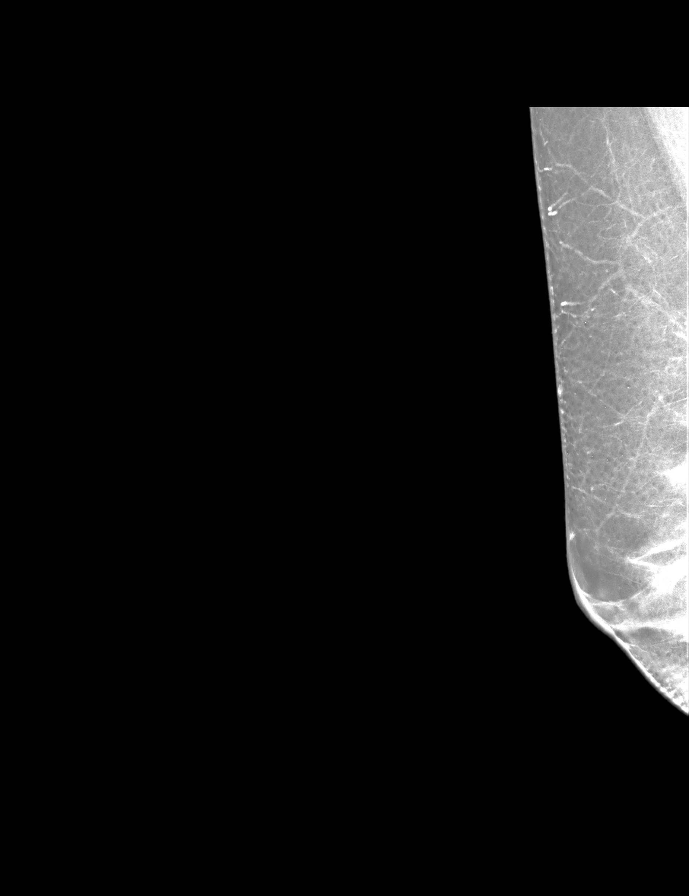

[R CC synth-2D (1 of 2)]
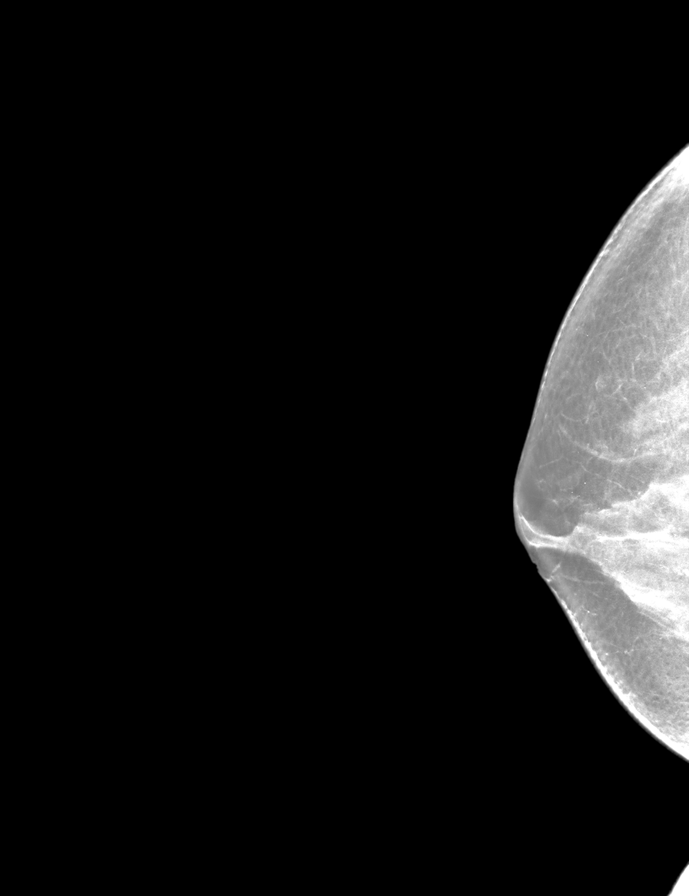

[R CC synth-2D (2 of 2)]
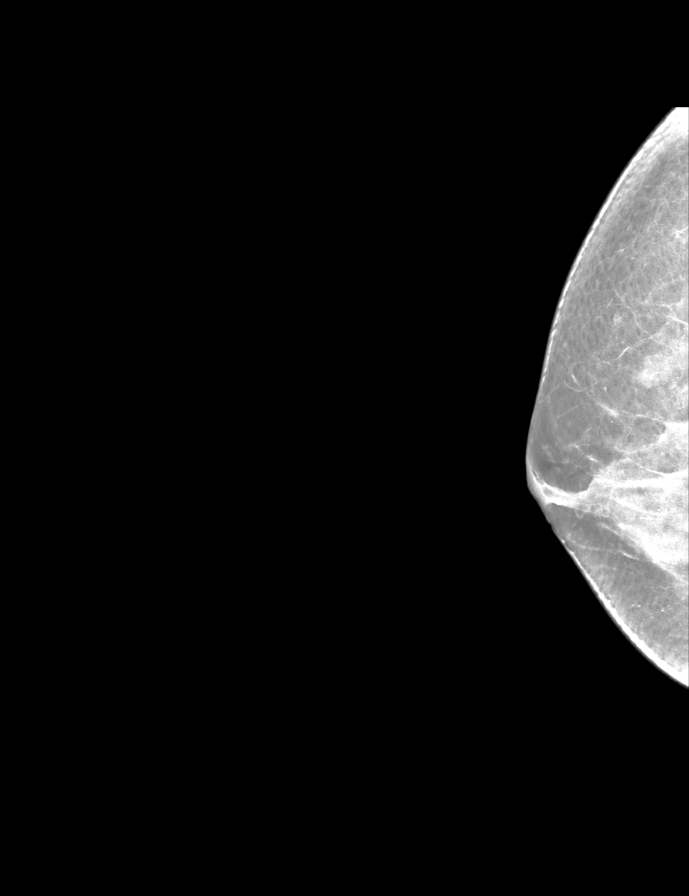

[L CC]
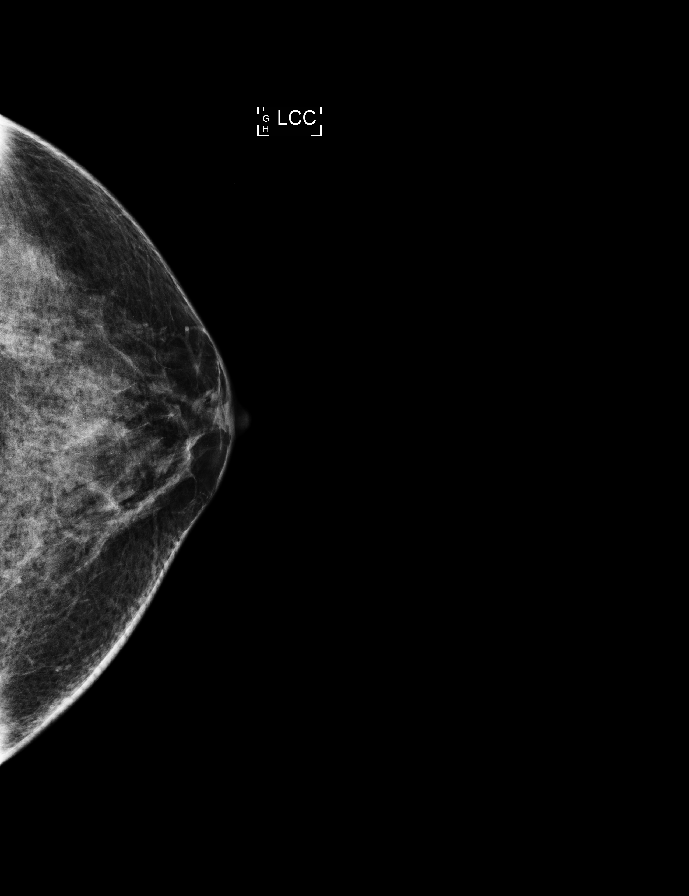

[R CC (1 of 2)]
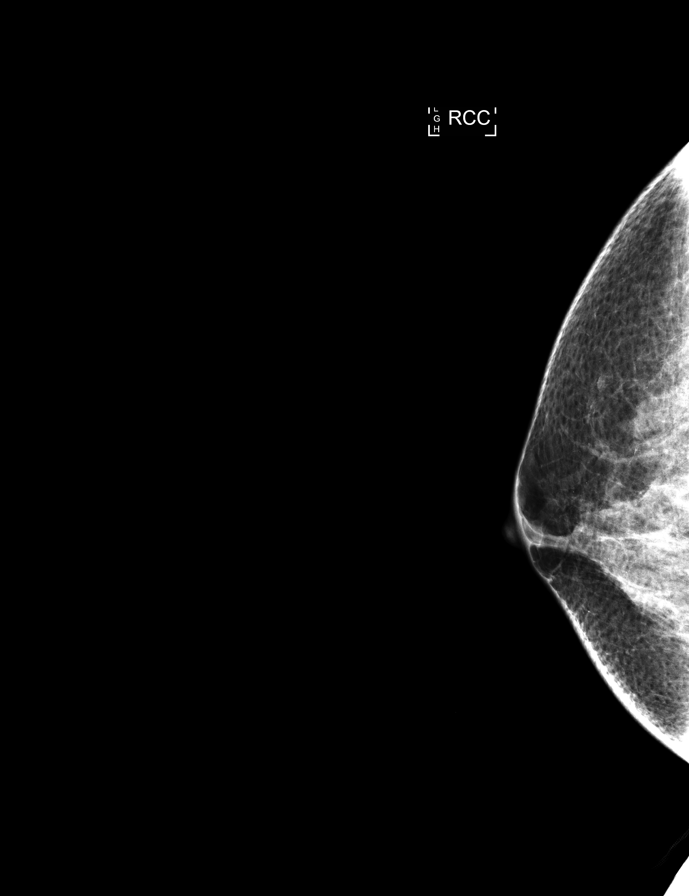

[L MLO]
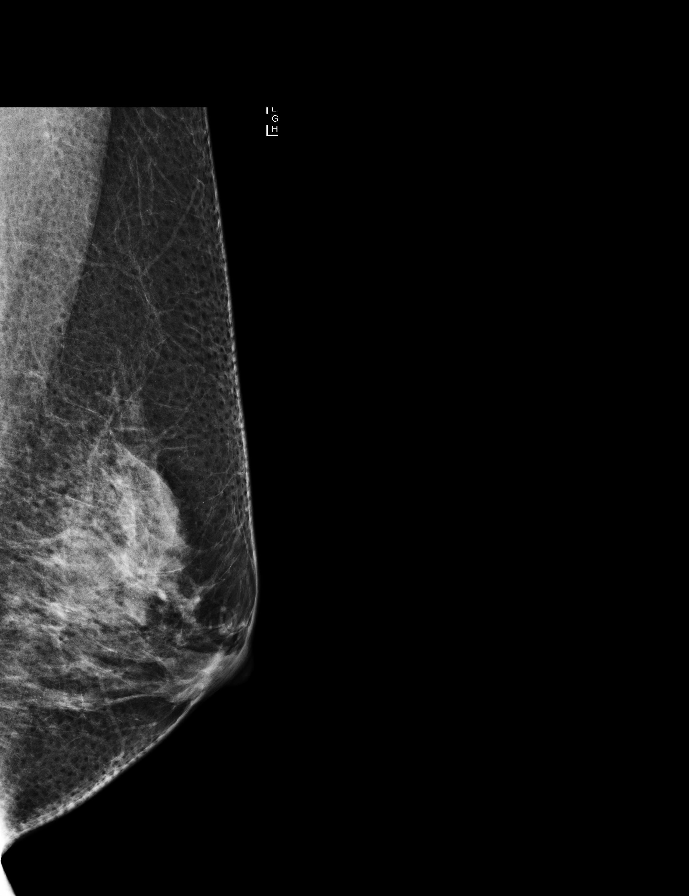

[R MLO]
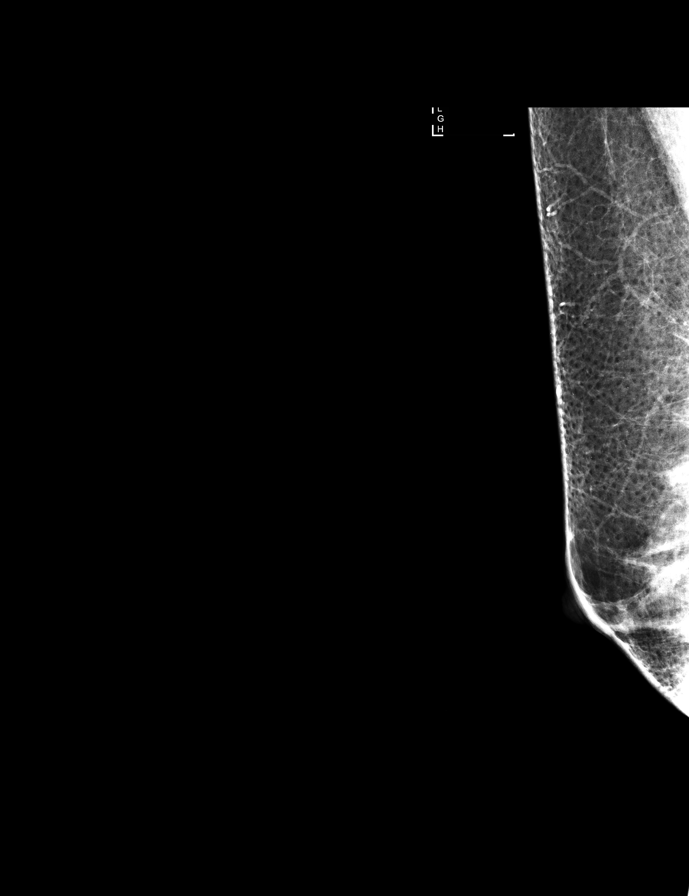

[L MLO synth-2D]
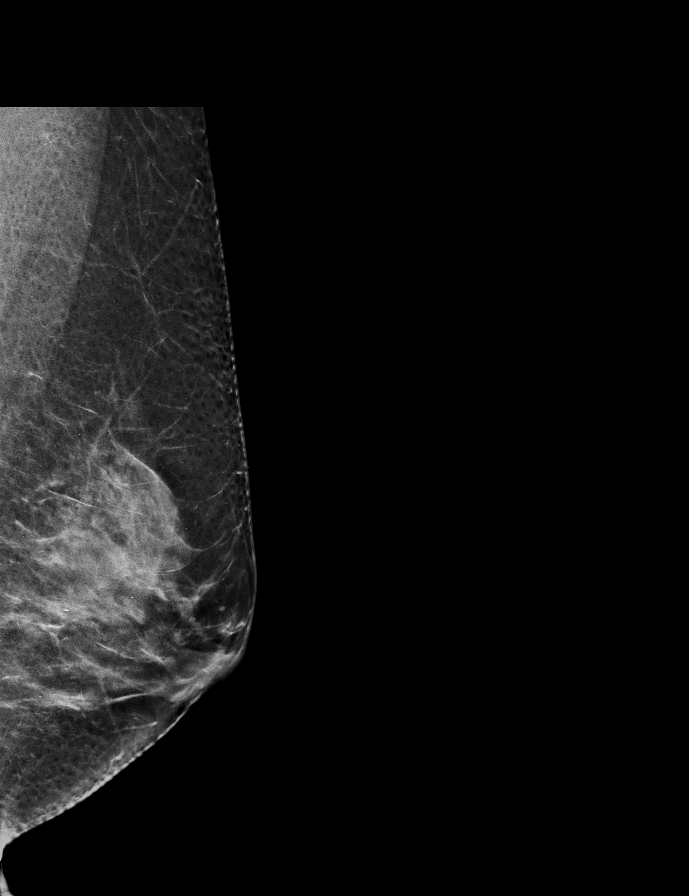

[R CC (2 of 2)]
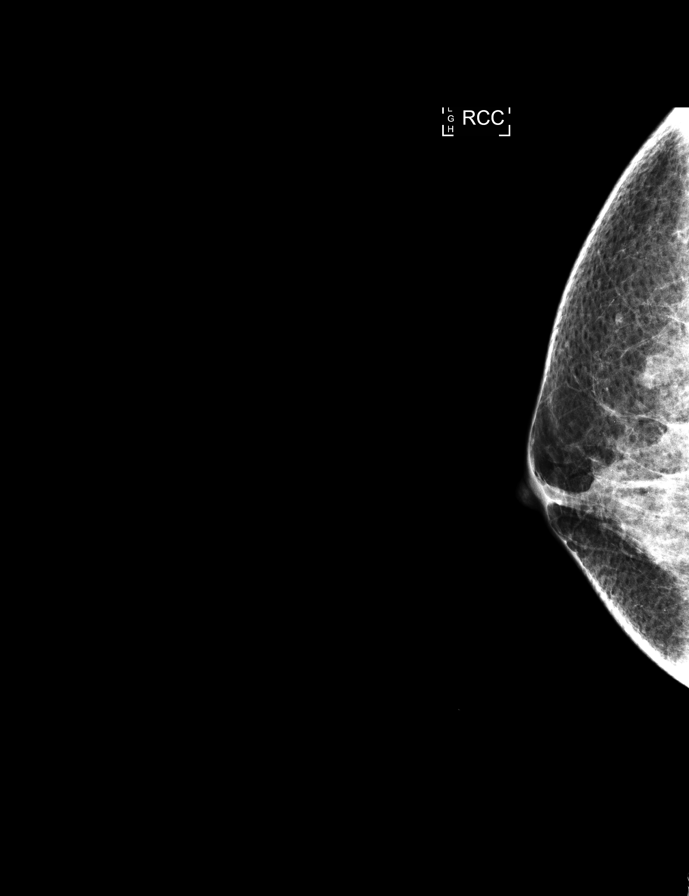

[9 of 35 positions shown; findings below may reference images not displayed]

ACR Breast Density Category c: The breast tissue is heterogeneously
dense, which may obscure small masses.
FINDINGS: In the left breast, calcifications warrant further evaluation with
magnified views. In the right breast, no findings suspicious for
malignancy. Images were processed with CAD.
IMPRESSION: Further evaluation is suggested for calcifications in the left
breast.

RECOMMENDATION:
Diagnostic mammogram of the left breast. (Code:[id number]-[id number])

The patient will be contacted regarding the findings, and additional
imaging will be scheduled.

BI-RADS CATEGORY  0: Incomplete. Need additional imaging evaluation
and/or prior mammograms for comparison.

## 2015-10-18 ENCOUNTER — Encounter: Payer: Self-pay | Admitting: Family Medicine

## 2015-10-18 ENCOUNTER — Ambulatory Visit (INDEPENDENT_AMBULATORY_CARE_PROVIDER_SITE_OTHER): Payer: BLUE CROSS/BLUE SHIELD | Admitting: Family Medicine

## 2015-10-18 VITALS — BP 140/67 | HR 85 | Temp 98.4°F | Ht 64.0 in | Wt 152.0 lb

## 2015-10-18 DIAGNOSIS — D649 Anemia, unspecified: Secondary | ICD-10-CM

## 2015-10-18 DIAGNOSIS — R739 Hyperglycemia, unspecified: Secondary | ICD-10-CM

## 2015-10-18 DIAGNOSIS — E785 Hyperlipidemia, unspecified: Secondary | ICD-10-CM | POA: Diagnosis not present

## 2015-10-18 DIAGNOSIS — Z0000 Encounter for general adult medical examination without abnormal findings: Secondary | ICD-10-CM

## 2015-10-18 DIAGNOSIS — R030 Elevated blood-pressure reading, without diagnosis of hypertension: Secondary | ICD-10-CM

## 2015-10-18 DIAGNOSIS — IMO0001 Reserved for inherently not codable concepts without codable children: Secondary | ICD-10-CM

## 2015-10-18 DIAGNOSIS — Z0001 Encounter for general adult medical examination with abnormal findings: Secondary | ICD-10-CM

## 2015-10-18 DIAGNOSIS — R000 Tachycardia, unspecified: Secondary | ICD-10-CM | POA: Diagnosis not present

## 2015-10-18 DIAGNOSIS — H6592 Unspecified nonsuppurative otitis media, left ear: Secondary | ICD-10-CM

## 2015-10-18 LAB — TSH: TSH: 1.67 u[IU]/mL (ref 0.35–4.50)

## 2015-10-18 LAB — LIPID PANEL
CHOL/HDL RATIO: 3
CHOLESTEROL: 231 mg/dL — AB (ref 0–200)
HDL: 82.4 mg/dL (ref >39.00)
LDL CALC: 122 mg/dL — AB (ref 0–99)
NonHDL: 148.29
TRIGLYCERIDES: 133 mg/dL (ref 0.0–149.0)
VLDL: 26.6 mg/dL (ref 0.0–40.0)

## 2015-10-18 LAB — COMPREHENSIVE METABOLIC PANEL
ALBUMIN: 4.5 g/dL (ref 3.5–5.2)
ALT: 15 U/L (ref 0–35)
AST: 24 U/L (ref 0–37)
Alkaline Phosphatase: 74 U/L (ref 39–117)
BUN: 14 mg/dL (ref 6–23)
CALCIUM: 9.6 mg/dL (ref 8.4–10.5)
CHLORIDE: 103 meq/L (ref 96–112)
CO2: 31 meq/L (ref 19–32)
Creatinine, Ser: 0.84 mg/dL (ref 0.40–1.20)
GFR: 74.95 mL/min (ref >60.00)
Glucose, Bld: 92 mg/dL (ref 70–99)
POTASSIUM: 4 meq/L (ref 3.5–5.1)
Sodium: 140 mEq/L (ref 135–145)
Total Bilirubin: 0.6 mg/dL (ref 0.2–1.2)
Total Protein: 7.5 g/dL (ref 6.0–8.3)

## 2015-10-18 LAB — CBC
HEMATOCRIT: 39.2 % (ref 36.0–46.0)
Hemoglobin: 13.6 g/dL (ref 12.0–15.0)
MCHC: 34.6 g/dL (ref 30.0–36.0)
MCV: 91.2 fl (ref 78.0–100.0)
PLATELETS: 196 10*3/uL (ref 150.0–400.0)
RBC: 4.3 Mil/uL (ref 3.87–5.11)
RDW: 12.7 % (ref 11.5–15.5)
WBC: 6.3 10*3/uL (ref 4.0–10.5)

## 2015-10-18 LAB — HEMOGLOBIN A1C: HEMOGLOBIN A1C: 5.7 % (ref 4.6–6.5)

## 2015-10-18 NOTE — Assessment & Plan Note (Signed)
Encouraged heart healthy diet, increase exercise, avoid trans fats, consider a krill oil cap daily. Taking [employer] Red Yeast Rice with Co Q 10 daily. Check level

## 2015-10-18 NOTE — Patient Instructions (Signed)
Preventive Care for Adults, Female A healthy lifestyle and preventive care can promote health and wellness. Preventive health guidelines for women include the following key practices.  A routine yearly physical is a good way to check with your health care provider about your health and preventive screening. It is a chance to share any concerns and updates on your health and to receive a thorough exam.  Visit your dentist for a routine exam and preventive care every 6 months. Brush your teeth twice a day and floss once a day. Good oral hygiene prevents tooth decay and gum disease.  The frequency of eye exams is based on your age, health, family medical history, use of contact lenses, and other factors. Follow your health care provider's recommendations for frequency of eye exams.  Eat a healthy diet. Foods like vegetables, fruits, whole grains, low-fat dairy products, and lean protein foods contain the nutrients you need without too many calories. Decrease your intake of foods high in solid fats, added sugars, and salt. Eat the right amount of calories for you.Get information about a proper diet from your health care provider, if necessary.  Regular physical exercise is one of the most important things you can do for your health. Most adults should get at least 150 minutes of moderate-intensity exercise (any activity that increases your heart rate and causes you to sweat) each week. In addition, most adults need muscle-strengthening exercises on 2 or more days a week.  Maintain a healthy weight. The body mass index (BMI) is a screening tool to identify possible weight problems. It provides an estimate of body fat based on height and weight. Your health care provider can find your BMI and can help you achieve or maintain a healthy weight.For adults 20 years and older:  A BMI below 18.5 is considered underweight.  A BMI of 18.5 to 24.9 is normal.  A BMI of 25 to 29.9 is considered  overweight.  A BMI of 30 and above is considered obese.  Maintain normal blood lipids and cholesterol levels by exercising and minimizing your intake of saturated fat. Eat a balanced diet with plenty of fruit and vegetables. Blood tests for lipids and cholesterol should begin at age 64 and be repeated every 5 years. If your lipid or cholesterol levels are high, you are over 50, or you are at high risk for heart disease, you may need your cholesterol levels checked more frequently.Ongoing high lipid and cholesterol levels should be treated with medicines if diet and exercise are not working.  If you smoke, find out from your health care provider how to quit. If you do not use tobacco, do not start.  Lung cancer screening is recommended for adults aged 52-80 years who are at high risk for developing lung cancer because of a history of smoking. A yearly low-dose CT scan of the lungs is recommended for people who have at least a 30-pack-year history of smoking and are a current smoker or have quit within the past 15 years. A pack year of smoking is smoking an average of 1 pack of cigarettes a day for 1 year (for example: 1 pack a day for 30 years or 2 packs a day for 15 years). Yearly screening should continue until the smoker has stopped smoking for at least 15 years. Yearly screening should be stopped for people who develop a health problem that would prevent them from having lung cancer treatment.  If you are pregnant, do not drink alcohol. If you are  breastfeeding, be very cautious about drinking alcohol. If you are not pregnant and choose to drink alcohol, do not have more than 1 drink per day. One drink is considered to be 12 ounces (355 mL) of beer, 5 ounces (148 mL) of wine, or 1.5 ounces (44 mL) of liquor.  Avoid use of street drugs. Do not share needles with anyone. Ask for help if you need support or instructions about stopping the use of drugs.  High blood pressure causes heart disease and  increases the risk of stroke. Your blood pressure should be checked at least every 1 to 2 years. Ongoing high blood pressure should be treated with medicines if weight loss and exercise do not work.  If you are 25-78 years old, ask your health care provider if you should take aspirin to prevent strokes.  Diabetes screening is done by taking a blood sample to check your blood glucose level after you have not eaten for a certain period of time (fasting). If you are not overweight and you do not have risk factors for diabetes, you should be screened once every 3 years starting at age 86. If you are overweight or obese and you are 3-87 years of age, you should be screened for diabetes every year as part of your cardiovascular risk assessment.  Breast cancer screening is essential preventive care for women. You should practice "breast self-awareness." This means understanding the normal appearance and feel of your breasts and may include breast self-examination. Any changes detected, no matter how small, should be reported to a health care provider. Women in their 66s and 30s should have a clinical breast exam (CBE) by a health care provider as part of a regular health exam every 1 to 3 years. After age 43, women should have a CBE every year. Starting at age 37, women should consider having a mammogram (breast X-ray test) every year. Women who have a family history of breast cancer should talk to their health care provider about genetic screening. Women at a high risk of breast cancer should talk to their health care providers about having an MRI and a mammogram every year.  Breast cancer gene (BRCA)-related cancer risk assessment is recommended for women who have family members with BRCA-related cancers. BRCA-related cancers include breast, ovarian, tubal, and peritoneal cancers. Having family members with these cancers may be associated with an increased risk for harmful changes (mutations) in the breast  cancer genes BRCA1 and BRCA2. Results of the assessment will determine the need for genetic counseling and BRCA1 and BRCA2 testing.  Your health care provider may recommend that you be screened regularly for cancer of the pelvic organs (ovaries, uterus, and vagina). This screening involves a pelvic examination, including checking for microscopic changes to the surface of your cervix (Pap test). You may be encouraged to have this screening done every 3 years, beginning at age 78.  For women ages 79-65, health care providers may recommend pelvic exams and Pap testing every 3 years, or they may recommend the Pap and pelvic exam, combined with testing for human papilloma virus (HPV), every 5 years. Some types of HPV increase your risk of cervical cancer. Testing for HPV may also be done on women of any age with unclear Pap test results.  Other health care providers may not recommend any screening for nonpregnant women who are considered low risk for pelvic cancer and who do not have symptoms. Ask your health care provider if a screening pelvic exam is right for  you.  If you have had past treatment for cervical cancer or a condition that could lead to cancer, you need Pap tests and screening for cancer for at least 20 years after your treatment. If Pap tests have been discontinued, your risk factors (such as having a new sexual partner) need to be reassessed to determine if screening should resume. Some women have medical problems that increase the chance of getting cervical cancer. In these cases, your health care provider may recommend more frequent screening and Pap tests.  Colorectal cancer can be detected and often prevented. Most routine colorectal cancer screening begins at the age of 50 years and continues through age 75 years. However, your health care provider may recommend screening at an earlier age if you have risk factors for colon cancer. On a yearly basis, your health care provider may provide  home test kits to check for hidden blood in the stool. Use of a small camera at the end of a tube, to directly examine the colon (sigmoidoscopy or colonoscopy), can detect the earliest forms of colorectal cancer. Talk to your health care provider about this at age 50, when routine screening begins. Direct exam of the colon should be repeated every 5-10 years through age 75 years, unless early forms of precancerous polyps or small growths are found.  People who are at an increased risk for hepatitis B should be screened for this virus. You are considered at high risk for hepatitis B if:  You were born in a country where hepatitis B occurs often. Talk with your health care provider about which countries are considered high risk.  Your parents were born in a high-risk country and you have not received a shot to protect against hepatitis B (hepatitis B vaccine).  You have HIV or AIDS.  You use needles to inject street drugs.  You live with, or have sex with, someone who has hepatitis B.  You get hemodialysis treatment.  You take certain medicines for conditions like cancer, organ transplantation, and autoimmune conditions.  Hepatitis C blood testing is recommended for all people born from 1945 through 1965 and any individual with known risks for hepatitis C.  Practice safe sex. Use condoms and avoid high-risk sexual practices to reduce the spread of sexually transmitted infections (STIs). STIs include gonorrhea, chlamydia, syphilis, trichomonas, herpes, HPV, and human immunodeficiency virus (HIV). Herpes, HIV, and HPV are viral illnesses that have no cure. They can result in disability, cancer, and death.  You should be screened for sexually transmitted illnesses (STIs) including gonorrhea and chlamydia if:  You are sexually active and are younger than 24 years.  You are older than 24 years and your health care provider tells you that you are at risk for this type of infection.  Your sexual  activity has changed since you were last screened and you are at an increased risk for chlamydia or gonorrhea. Ask your health care provider if you are at risk.  If you are at risk of being infected with HIV, it is recommended that you take a prescription medicine daily to prevent HIV infection. This is called preexposure prophylaxis (PrEP). You are considered at risk if:  You are sexually active and do not regularly use condoms or know the HIV status of your partner(s).  You take drugs by injection.  You are sexually active with a partner who has HIV.  Talk with your health care provider about whether you are at high risk of being infected with HIV. If   you choose to begin PrEP, you should first be tested for HIV. You should then be tested every 3 months for as long as you are taking PrEP.  Osteoporosis is a disease in which the bones lose minerals and strength with aging. This can result in serious bone fractures or breaks. The risk of osteoporosis can be identified using a bone density scan. Women ages 1 years and over and women at risk for fractures or osteoporosis should discuss screening with their health care providers. Ask your health care provider whether you should take a calcium supplement or vitamin D to reduce the rate of osteoporosis.  Menopause can be associated with physical symptoms and risks. Hormone replacement therapy is available to decrease symptoms and risks. You should talk to your health care provider about whether hormone replacement therapy is right for you.  Use sunscreen. Apply sunscreen liberally and repeatedly throughout the day. You should seek shade when your shadow is shorter than you. Protect yourself by wearing long sleeves, pants, a wide-brimmed hat, and sunglasses year round, whenever you are outdoors.  Once a month, do a whole body skin exam, using a mirror to look at the skin on your back. Tell your health care provider of new moles, moles that have irregular  borders, moles that are larger than a pencil eraser, or moles that have changed in shape or color.  Stay current with required vaccines (immunizations).  Influenza vaccine. All adults should be immunized every year.  Tetanus, diphtheria, and acellular pertussis (Td, Tdap) vaccine. Pregnant women should receive 1 dose of Tdap vaccine during each pregnancy. The dose should be obtained regardless of the length of time since the last dose. Immunization is preferred during the 27th-36th week of gestation. An adult who has not previously received Tdap or who does not know her vaccine status should receive 1 dose of Tdap. This initial dose should be followed by tetanus and diphtheria toxoids (Td) booster doses every 10 years. Adults with an unknown or incomplete history of completing a 3-dose immunization series with Td-containing vaccines should begin or complete a primary immunization series including a Tdap dose. Adults should receive a Td booster every 10 years.  Varicella vaccine. An adult without evidence of immunity to varicella should receive 2 doses or a second dose if she has previously received 1 dose. Pregnant females who do not have evidence of immunity should receive the first dose after pregnancy. This first dose should be obtained before leaving the health care facility. The second dose should be obtained 4-8 weeks after the first dose.  Human papillomavirus (HPV) vaccine. Females aged 13-26 years who have not received the vaccine previously should obtain the 3-dose series. The vaccine is not recommended for use in pregnant females. However, pregnancy testing is not needed before receiving a dose. If a female is found to be pregnant after receiving a dose, no treatment is needed. In that case, the remaining doses should be delayed until after the pregnancy. Immunization is recommended for any person with an immunocompromised condition through the age of 24 years if she did not get any or all doses  earlier. During the 3-dose series, the second dose should be obtained 4-8 weeks after the first dose. The third dose should be obtained 24 weeks after the first dose and 16 weeks after the second dose.  Zoster vaccine. One dose is recommended for adults aged 97 years or older unless certain conditions are present.  Measles, mumps, and rubella (MMR) vaccine. Adults born  before 1957 generally are considered immune to measles and mumps. Adults born in [birth year] or later should have 1 or more doses of MMR vaccine unless there is a contraindication to the vaccine or there is laboratory evidence of immunity to each of the three diseases. A routine second dose of MMR vaccine should be obtained at least 28 days after the first dose for students attending postsecondary schools, health care workers, or international travelers. People who received inactivated measles vaccine or an unknown type of measles vaccine during 1963-1967 should receive 2 doses of MMR vaccine. People who received inactivated mumps vaccine or an unknown type of mumps vaccine before 1979 and are at high risk for mumps infection should consider immunization with 2 doses of MMR vaccine. For females of childbearing age, rubella immunity should be determined. If there is no evidence of immunity, females who are not pregnant should be vaccinated. If there is no evidence of immunity, females who are pregnant should delay immunization until after pregnancy. Unvaccinated health care workers born before 60 who lack laboratory evidence of measles, mumps, or rubella immunity or laboratory confirmation of disease should consider measles and mumps immunization with 2 doses of MMR vaccine or rubella immunization with 1 dose of MMR vaccine.  Pneumococcal 13-valent conjugate (PCV13) vaccine. When indicated, a person who is uncertain of his immunization history and has no record of immunization should receive the PCV13 vaccine. All adults 61 years of age and older  should receive this vaccine. An adult aged 92 years or older who has certain medical conditions and has not been previously immunized should receive 1 dose of PCV13 vaccine. This PCV13 should be followed with a dose of pneumococcal polysaccharide (PPSV23) vaccine. Adults who are at high risk for pneumococcal disease should obtain the PPSV23 vaccine at least 8 weeks after the dose of PCV13 vaccine. Adults older than 54 years of age who have normal immune system function should obtain the PPSV23 vaccine dose at least 1 year after the dose of PCV13 vaccine.  Pneumococcal polysaccharide (PPSV23) vaccine. When PCV13 is also indicated, PCV13 should be obtained first. All adults aged 2 years and older should be immunized. An adult younger than age 30 years who has certain medical conditions should be immunized. Any person who resides in a nursing home or long-term care facility should be immunized. An adult smoker should be immunized. People with an immunocompromised condition and certain other conditions should receive both PCV13 and PPSV23 vaccines. People with human immunodeficiency virus (HIV) infection should be immunized as soon as possible after diagnosis. Immunization during chemotherapy or radiation therapy should be avoided. Routine use of PPSV23 vaccine is not recommended for American Indians, [place] Natives, or people younger than 65 years unless there are medical conditions that require PPSV23 vaccine. When indicated, people who have unknown immunization and have no record of immunization should receive PPSV23 vaccine. One-time revaccination 5 years after the first dose of PPSV23 is recommended for people aged 19-64 years who have chronic kidney failure, nephrotic syndrome, asplenia, or immunocompromised conditions. People who received 1-2 doses of PPSV23 before age 44 years should receive another dose of PPSV23 vaccine at age 83 years or later if at least 5 years have passed since the previous dose. Doses  of PPSV23 are not needed for people immunized with PPSV23 at or after age 20 years.  Meningococcal vaccine. Adults with asplenia or persistent complement component deficiencies should receive 2 doses of quadrivalent meningococcal conjugate (MenACWY-D) vaccine. The doses should be obtained  at least 2 months apart. Microbiologists working with certain meningococcal bacteria, [place] recruits, people at risk during an outbreak, and people who travel to or live in countries with a high rate of meningitis should be immunized. A first-year college student up through age 28 years who is living in a residence hall should receive a dose if she did not receive a dose on or after her 16th birthday. Adults who have certain high-risk conditions should receive one or more doses of vaccine.  Hepatitis A vaccine. Adults who wish to be protected from this disease, have certain high-risk conditions, work with hepatitis A-infected animals, work in hepatitis A research labs, or travel to or work in countries with a high rate of hepatitis A should be immunized. Adults who were previously unvaccinated and who anticipate close contact with an international adoptee during the first 60 days after arrival in the [place] from a country with a high rate of hepatitis A should be immunized.  Hepatitis B vaccine. Adults who wish to be protected from this disease, have certain high-risk conditions, may be exposed to blood or other infectious body fluids, are household contacts or sex partners of hepatitis B positive people, are clients or workers in certain care facilities, or travel to or work in countries with a high rate of hepatitis B should be immunized.  Haemophilus influenzae type b (Hib) vaccine. A previously unvaccinated person with asplenia or sickle cell disease or having a scheduled splenectomy should receive 1 dose of Hib vaccine. Regardless of previous immunization, a recipient of a hematopoietic stem cell transplant  should receive a 3-dose series 6-12 months after her successful transplant. Hib vaccine is not recommended for adults with HIV infection. Preventive Services / Frequency Ages 71 to 87 years  Blood pressure check.** / Every 3-5 years.  Lipid and cholesterol check.** / Every 5 years beginning at age 1.  Clinical breast exam.** / Every 3 years for women in their 3s and 31s.  BRCA-related cancer risk assessment.** / For women who have family members with a BRCA-related cancer (breast, ovarian, tubal, or peritoneal cancers).  Pap test.** / Every 2 years from ages 50 through 86. Every 3 years starting at age 87 through age 7 or 75 with a history of 3 consecutive normal Pap tests.  HPV screening.** / Every 3 years from ages 59 through ages 35 to 6 with a history of 3 consecutive normal Pap tests.  Hepatitis C blood test.** / For any individual with known risks for hepatitis C.  Skin self-exam. / Monthly.  Influenza vaccine. / Every year.  Tetanus, diphtheria, and acellular pertussis (Tdap, Td) vaccine.** / Consult your health care provider. Pregnant women should receive 1 dose of Tdap vaccine during each pregnancy. 1 dose of Td every 10 years.  Varicella vaccine.** / Consult your health care provider. Pregnant females who do not have evidence of immunity should receive the first dose after pregnancy.  HPV vaccine. / 3 doses over 6 months, if 72 and younger. The vaccine is not recommended for use in pregnant females. However, pregnancy testing is not needed before receiving a dose.  Measles, mumps, rubella (MMR) vaccine.** / You need at least 1 dose of MMR if you were born in 1957 or later. You may also need a 2nd dose. For females of childbearing age, rubella immunity should be determined. If there is no evidence of immunity, females who are not pregnant should be vaccinated. If there is no evidence of immunity, females who are  pregnant should delay immunization until after  pregnancy.  Pneumococcal 13-valent conjugate (PCV13) vaccine.** / Consult your health care provider.  Pneumococcal polysaccharide (PPSV23) vaccine.** / 1 to 2 doses if you smoke cigarettes or if you have certain conditions.  Meningococcal vaccine.** / 1 dose if you are age 87 to 44 years and a Market researcher living in a residence hall, or have one of several medical conditions, you need to get vaccinated against meningococcal disease. You may also need additional booster doses.  Hepatitis A vaccine.** / Consult your health care provider.  Hepatitis B vaccine.** / Consult your health care provider.  Haemophilus influenzae type b (Hib) vaccine.** / Consult your health care provider. Ages 86 to 38 years  Blood pressure check.** / Every year.  Lipid and cholesterol check.** / Every 5 years beginning at age 49 years.  Lung cancer screening. / Every year if you are aged 71-80 years and have a 30-pack-year history of smoking and currently smoke or have quit within the past 15 years. Yearly screening is stopped once you have quit smoking for at least 15 years or develop a health problem that would prevent you from having lung cancer treatment.  Clinical breast exam.** / Every year after age 51 years.  BRCA-related cancer risk assessment.** / For women who have family members with a BRCA-related cancer (breast, ovarian, tubal, or peritoneal cancers).  Mammogram.** / Every year beginning at age 18 years and continuing for as long as you are in good health. Consult with your health care provider.  Pap test.** / Every 3 years starting at age 63 years through age 37 or 57 years with a history of 3 consecutive normal Pap tests.  HPV screening.** / Every 3 years from ages 41 years through ages 76 to 23 years with a history of 3 consecutive normal Pap tests.  Fecal occult blood test (FOBT) of stool. / Every year beginning at age 36 years and continuing until age 51 years. You may not need  to do this test if you get a colonoscopy every 10 years.  Flexible sigmoidoscopy or colonoscopy.** / Every 5 years for a flexible sigmoidoscopy or every 10 years for a colonoscopy beginning at age 36 years and continuing until age 35 years.  Hepatitis C blood test.** / For all people born from [birth year] through 1964 and any individual with known risks for hepatitis C.  Skin self-exam. / Monthly.  Influenza vaccine. / Every year.  Tetanus, diphtheria, and acellular pertussis (Tdap/Td) vaccine.** / Consult your health care provider. Pregnant women should receive 1 dose of Tdap vaccine during each pregnancy. 1 dose of Td every 10 years.  Varicella vaccine.** / Consult your health care provider. Pregnant females who do not have evidence of immunity should receive the first dose after pregnancy.  Zoster vaccine.** / 1 dose for adults aged 73 years or older.  Measles, mumps, rubella (MMR) vaccine.** / You need at least 1 dose of MMR if you were born in 1957 or later. You may also need a second dose. For females of childbearing age, rubella immunity should be determined. If there is no evidence of immunity, females who are not pregnant should be vaccinated. If there is no evidence of immunity, females who are pregnant should delay immunization until after pregnancy.  Pneumococcal 13-valent conjugate (PCV13) vaccine.** / Consult your health care provider.  Pneumococcal polysaccharide (PPSV23) vaccine.** / 1 to 2 doses if you smoke cigarettes or if you have certain conditions.  Meningococcal vaccine.** /  Consult your health care provider.  Hepatitis A vaccine.** / Consult your health care provider.  Hepatitis B vaccine.** / Consult your health care provider.  Haemophilus influenzae type b (Hib) vaccine.** / Consult your health care provider. Ages 80 years and over  Blood pressure check.** / Every year.  Lipid and cholesterol check.** / Every 5 years beginning at age 62 years.  Lung cancer  screening. / Every year if you are aged 32-80 years and have a 30-pack-year history of smoking and currently smoke or have quit within the past 15 years. Yearly screening is stopped once you have quit smoking for at least 15 years or develop a health problem that would prevent you from having lung cancer treatment.  Clinical breast exam.** / Every year after age 61 years.  BRCA-related cancer risk assessment.** / For women who have family members with a BRCA-related cancer (breast, ovarian, tubal, or peritoneal cancers).  Mammogram.** / Every year beginning at age 39 years and continuing for as long as you are in good health. Consult with your health care provider.  Pap test.** / Every 3 years starting at age 85 years through age 74 or 72 years with 3 consecutive normal Pap tests. Testing can be stopped between 65 and 70 years with 3 consecutive normal Pap tests and no abnormal Pap or HPV tests in the past 10 years.  HPV screening.** / Every 3 years from ages 55 years through ages 67 or 77 years with a history of 3 consecutive normal Pap tests. Testing can be stopped between 65 and 70 years with 3 consecutive normal Pap tests and no abnormal Pap or HPV tests in the past 10 years.  Fecal occult blood test (FOBT) of stool. / Every year beginning at age 81 years and continuing until age 22 years. You may not need to do this test if you get a colonoscopy every 10 years.  Flexible sigmoidoscopy or colonoscopy.** / Every 5 years for a flexible sigmoidoscopy or every 10 years for a colonoscopy beginning at age 67 years and continuing until age 22 years.  Hepatitis C blood test.** / For all people born from [birth year] through 1964 and any individual with known risks for hepatitis C.  Osteoporosis screening.** / A one-time screening for women ages 8 years and over and women at risk for fractures or osteoporosis.  Skin self-exam. / Monthly.  Influenza vaccine. / Every year.  Tetanus, diphtheria, and  acellular pertussis (Tdap/Td) vaccine.** / 1 dose of Td every 10 years.  Varicella vaccine.** / Consult your health care provider.  Zoster vaccine.** / 1 dose for adults aged 56 years or older.  Pneumococcal 13-valent conjugate (PCV13) vaccine.** / Consult your health care provider.  Pneumococcal polysaccharide (PPSV23) vaccine.** / 1 dose for all adults aged 15 years and older.  Meningococcal vaccine.** / Consult your health care provider.  Hepatitis A vaccine.** / Consult your health care provider.  Hepatitis B vaccine.** / Consult your health care provider.  Haemophilus influenzae type b (Hib) vaccine.** / Consult your health care provider. ** Family history and personal history of risk and conditions may change your health care provider's recommendations.   This information is not intended to replace advice given to you by your health care provider. Make sure you discuss any questions you have with your health care provider.   Document Released: 03/20/2001 Document Revised: 02/12/2014 Document Reviewed: 06/19/2010 Elsevier Interactive Patient Education [employer].

## 2015-10-18 NOTE — Progress Notes (Signed)
Pre visit review using our clinic review tool, if applicable. No additional management support is needed unless otherwise documented below in the visit note. 

## 2015-10-18 NOTE — Assessment & Plan Note (Signed)
Patient encouraged to maintain heart healthy diet, regular exercise, adequate sleep. Consider daily probiotics. Take medications as prescribed. Given and reviewed copy of ACP documents from [place] Secretary of State and encouraged to complete and return 

## 2015-10-18 NOTE — Assessment & Plan Note (Signed)
RRR today 

## 2015-10-18 NOTE — Assessment & Plan Note (Signed)
Well controlled. Encouraged heart healthy diet such as the DASH diet and exercise as tolerated.  

## 2015-10-19 LAB — HEPATITIS C ANTIBODY: HCV AB: NEGATIVE

## 2015-10-23 NOTE — Assessment & Plan Note (Signed)
Zyrtec, Flonase and Valsalva maneuver.

## 2015-10-23 NOTE — Progress Notes (Signed)
Patient ID: [name], female   DOB: 03/21/1960, 55 y.o.   MRN: [id number]    Subjective:    Patient ID: [name], female    DOB: 06/06/1960, 55 y.o.   MRN: [id number]  Chief Complaint  Patient presents with  . Annual Exam    HPI Patient is in today for annual preventative exam and follow upon numerous medical concerns. She feels well today but she has had some congestion and itchy eyes. She must also notes some crackling in her left ear this past week. No tinnitus. Also noted right thumb and right 4th finger have been sore this week, no injury. She has been taking Red Yeast Rice. Denies CP/palp/SOB/HA/congestion/fevers/GI or GU c/o. Taking meds as prescribed  Past Medical History:  Diagnosis Date  . Allergic state 04/12/2011  . Anemia    menorraghia  . Breast mass, left 03/21/2011  . Chest pain 03/12/2011  . Chicken pox as a child  . Elevated antinuclear antibody (ANA) level 03/12/2011  . Elevated BP 03/12/2011  . Glaucoma 03/12/2011  . Heart murmur   . History of UTI   . Hyperglycemia 03/12/2011  . Hyperlipidemia 03/12/2011  . Palpitations   . Perimenopausal 10/05/2012  . Preventative health care 10/12/2013  . Refusal of blood transfusions as patient is Jehovah's Witness 10/12/2014  . Screening for cervical cancer 03/21/2011  . SOM (serous otitis media) 04/12/2011  . Tachycardia 03/21/2011    Past Surgical History:  Procedure Laterality Date  . LACERATION REPAIR     age 89, dog attack, stitches in face, multipe sites right side of face    Family History  Problem Relation Age of Onset  . Heart attack Father [id number]  . Hypertension Father   . Hyperlipidemia Father   . Heart disease Father [id number]    MI  . Varicose Veins Father   . Hypertension Sister   . Ovarian cysts Sister   . Hyperlipidemia Sister   . Heart attack Brother 43  . Hypertension Brother   . Heart disease Brother   . Hyperlipidemia Brother   . Anxiety disorder Daughter   . OCD Daughter   . Heart attack Maternal  Grandmother   . Hypertension Maternal Grandmother     valvular heart disease  . Cancer Maternal Grandfather     skin, bcc  . Glaucoma Maternal Grandfather   . Stroke Paternal Grandfather   . Cancer Mother     skin, bcc, scc  . Heart disease Mother     arrythmia  . Hyperlipidemia Mother   . Colon cancer Neg Hx     Social History   Social History  . Marital status: Married    Spouse name: N/A  . Number of children: N/A  . Years of education: N/A   Occupational History  . Not on file.   Social History Main Topics  . Smoking status: Never Smoker  . Smokeless tobacco: Never Used  . Alcohol use No  . Drug use: No  . Sexual activity: Yes    Partners: Male   Other Topics Concern  . Not on file   Social History Narrative  . No narrative on file    Outpatient Medications Prior to Visit  Medication Sig Dispense Refill  . aspirin 81 MG tablet Take 81 mg by mouth daily.    . Calcium-Cholecalciferol (VITA-CALCIUM PO) Take by mouth daily. 1 chewable    . Calcium-Magnesium-Vitamin D (CITRACAL CALCIUM+D PO) Take by mouth daily.    . fish oil-omega-3 fatty  acids 1000 MG capsule Take 1 g by mouth daily.     . Flaxseed Oil OIL Take 1,400 mg by mouth daily.    Marland Kitchen latanoprost (XALATAN) 0.005 % ophthalmic solution Place 1 drop into both eyes at bedtime.    . multivitamin-iron-minerals-folic acid (CENTRUM) chewable tablet Chew 1 tablet by mouth daily.    . Probiotic Product (PROBIOTIC PO) Take 1 tablet by mouth.     . psyllium (REGULOID) 0.52 G capsule Take 2 capsules by mouth daily.    . fluticasone (FLONASE) 50 MCG/ACT nasal spray Place 2 sprays into both nostrils daily. (Patient not taking: Reported on 10/12/2014) 16 g 3  . montelukast (SINGULAIR) 10 MG tablet Take 1 tablet (10 mg total) by mouth at bedtime. (Patient not taking: Reported on 10/12/2014) 30 tablet 3   No facility-administered medications prior to visit.     Allergies  Allergen Reactions  . Penicillins Rash  .  Clindamycin Hcl Rash    Review of Systems  Constitutional: Negative for chills, fever and malaise/fatigue.  HENT: Positive for ear pain. Negative for congestion, ear discharge, hearing loss and tinnitus.   Eyes: Negative for discharge.  Respiratory: Negative for cough, sputum production and shortness of breath.   Cardiovascular: Negative for chest pain, palpitations and leg swelling.  Gastrointestinal: Negative for abdominal pain, blood in stool, constipation, diarrhea, heartburn, nausea and vomiting.  Genitourinary: Negative for dysuria, frequency, hematuria and urgency.  Musculoskeletal: Negative for back pain, falls and myalgias.  Skin: Negative for rash.  Neurological: Negative for dizziness, sensory change, loss of consciousness, weakness and headaches.  Endo/Heme/Allergies: Negative for environmental allergies. Does not bruise/bleed easily.  Psychiatric/Behavioral: Negative for depression and suicidal ideas. The patient is not nervous/anxious and does not have insomnia.        Objective:    Physical Exam  Constitutional: She is oriented to person, place, and time. She appears well-developed and well-nourished. No distress.  HENT:  Head: Normocephalic and atraumatic.  Eyes: Conjunctivae are normal.  Neck: Neck supple. No thyromegaly present.  Cardiovascular: Normal rate, regular rhythm and normal heart sounds.   No murmur heard. Pulmonary/Chest: Effort normal and breath sounds normal. No respiratory distress.  Abdominal: Soft. Bowel sounds are normal. She exhibits no distension and no mass. There is no tenderness.  Musculoskeletal: She exhibits no edema.  Lymphadenopathy:    She has no cervical adenopathy.  Neurological: She is alert and oriented to person, place, and time.  Skin: Skin is warm and dry.  Psychiatric: She has a normal mood and affect. Her behavior is normal.    BP 140/67 (BP Location: Left Arm, Patient Position: Sitting, Cuff Size: Normal)   Pulse 85    Temp 98.4 F (36.9 C) (Oral)   Ht 5\' 4"  (1.626 m)   Wt 152 lb (68.9 kg)   SpO2 98%   BMI 26.09 kg/m  Wt Readings from Last 3 Encounters:  10/18/15 152 lb (68.9 kg)  10/12/14 146 lb 2 oz (66.3 kg)  06/14/14 146 lb 6 oz (66.4 kg)     Lab Results  Component Value Date   WBC 6.3 10/18/2015   HGB 13.6 10/18/2015   HCT 39.2 10/18/2015   PLT 196.0 10/18/2015   GLUCOSE 92 10/18/2015   CHOL 231 (H) 10/18/2015   TRIG 133.0 10/18/2015   HDL 82.40 10/18/2015   LDLDIRECT 122.7 03/26/2012   LDLCALC 122 (H) 10/18/2015   ALT 15 10/18/2015   AST 24 10/18/2015   NA 140 10/18/2015   K  4.0 10/18/2015   CL 103 10/18/2015   CREATININE 0.84 10/18/2015   BUN 14 10/18/2015   CO2 31 10/18/2015   TSH 1.67 10/18/2015   HGBA1C 5.7 10/18/2015    Lab Results  Component Value Date   TSH 1.67 10/18/2015   Lab Results  Component Value Date   WBC 6.3 10/18/2015   HGB 13.6 10/18/2015   HCT 39.2 10/18/2015   MCV 91.2 10/18/2015   PLT 196.0 10/18/2015   Lab Results  Component Value Date   NA 140 10/18/2015   K 4.0 10/18/2015   CO2 31 10/18/2015   GLUCOSE 92 10/18/2015   BUN 14 10/18/2015   CREATININE 0.84 10/18/2015   BILITOT 0.6 10/18/2015   ALKPHOS 74 10/18/2015   AST 24 10/18/2015   ALT 15 10/18/2015   PROT 7.5 10/18/2015   ALBUMIN 4.5 10/18/2015   CALCIUM 9.6 10/18/2015   GFR 74.95 10/18/2015   Lab Results  Component Value Date   CHOL 231 (H) 10/18/2015   Lab Results  Component Value Date   HDL 82.40 10/18/2015   Lab Results  Component Value Date   LDLCALC 122 (H) 10/18/2015   Lab Results  Component Value Date   TRIG 133.0 10/18/2015   Lab Results  Component Value Date   CHOLHDL 3 10/18/2015   Lab Results  Component Value Date   HGBA1C 5.7 10/18/2015       Assessment & Plan:   Problem List Items Addressed This Visit    Anemia   Relevant Orders   CBC (Completed)   Hyperlipidemia - Primary    Encouraged heart healthy diet, increase exercise, avoid trans  fats, consider a krill oil cap daily. Taking SolaRay Red Yeast Rice with Co Q 10 daily. Check level      Relevant Orders   Lipid panel (Completed)   Hyperglycemia   Relevant Orders   Hemoglobin A1c (Completed)   Elevated BP    Well controlled. Encouraged heart healthy diet such as the DASH diet and exercise as tolerated.       Relevant Orders   TSH (Completed)   CBC (Completed)   Comprehensive metabolic panel (Completed)   Tachycardia    RRR today      Relevant Orders   TSH (Completed)   SOM (serous otitis media)    Zyrtec, Flonase and Valsalva maneuver.       Preventative health care    Patient encouraged to maintain heart healthy diet, regular exercise, adequate sleep. Consider daily probiotics. Take medications as prescribed. Given and reviewed copy of ACP documents from [facility] Secretary of State and encouraged to complete and return      Relevant Orders   Hepatitis C Antibody (Completed)    Other Visit Diagnoses   None.     I have discontinued Ms. Roarty's montelukast and fluticasone. I am also having her maintain her Flaxseed Oil, multivitamin-iron-minerals-folic acid, Calcium-Magnesium-Vitamin D (CITRACAL CALCIUM+D PO), Calcium-Cholecalciferol (VITA-CALCIUM PO), latanoprost, aspirin, fish oil-omega-3 fatty acids, psyllium, Probiotic Product (PROBIOTIC PO), and Coenzyme Q10-Red Yeast Rice.  Meds ordered this encounter  Medications  . Coenzyme Q10-Red Yeast Rice (CO Q-10 PLUS RED YEAST RICE) 60-600 MG CAPS    Sig: Take by mouth.     Danise Edge, MD

## 2016-03-26 ENCOUNTER — Encounter: Payer: Self-pay | Admitting: Family

## 2016-03-26 ENCOUNTER — Ambulatory Visit (INDEPENDENT_AMBULATORY_CARE_PROVIDER_SITE_OTHER): Payer: BLUE CROSS/BLUE SHIELD | Admitting: Family

## 2016-03-26 VITALS — BP 112/75 | HR 102 | Temp 97.5°F | Ht 64.0 in | Wt 147.2 lb

## 2016-03-26 DIAGNOSIS — I8390 Asymptomatic varicose veins of unspecified lower extremity: Secondary | ICD-10-CM

## 2016-03-26 DIAGNOSIS — R202 Paresthesia of skin: Secondary | ICD-10-CM

## 2016-03-26 LAB — VITAMIN B12: VITAMIN B 12: 536 pg/mL (ref 211–911)

## 2016-03-26 LAB — FOLATE: Folate: >23.4 ng/mL (ref >5.9)

## 2016-03-26 NOTE — Progress Notes (Signed)
Pre visit review using our clinic review tool, if applicable. No additional management support is needed unless otherwise documented below in the visit note. 

## 2016-03-26 NOTE — Progress Notes (Signed)
**Note Maria-Identified via Obfuscation** Subjective:    Patient ID: Maria Lucas, female    DOB: Nov 20, 1960, 56 y.o.   MRN: [id number]  HPI  Ms. Cowing is a 56 yr old female who presents today with chief complaint of leg pain. Notes occasional tingling in the legs.  Notes increased spider veins.  She does report some swelling.  She notes mild low back pain.  Reports that she is in the process of moving and has been carrying heavy things up and down her split level home.  Review of Systems See HPI  Past Medical History:  Diagnosis Date  . Allergic state 04/12/2011  . Anemia    menorraghia  . Breast mass, left 03/21/2011  . Chest pain 03/12/2011  . Chicken pox as a child  . Elevated antinuclear antibody (ANA) level 03/12/2011  . Elevated BP 03/12/2011  . Glaucoma 03/12/2011  . Heart murmur   . History of UTI   . Hyperglycemia 03/12/2011  . Hyperlipidemia 03/12/2011  . Palpitations   . Perimenopausal 10/05/2012  . Preventative health care 10/12/2013  . Refusal of blood transfusions as patient is Jehovah's Witness 10/12/2014  . Screening for cervical cancer 03/21/2011  . SOM (serous otitis media) 04/12/2011  . Tachycardia 03/21/2011     Social History   Social History  . Marital status: Married    Spouse name: N/A  . Number of children: N/A  . Years of education: N/A   Occupational History  . Not on file.   Social History Main Topics  . Smoking status: Never Smoker  . Smokeless tobacco: Never Used  . Alcohol use No  . Drug use: No  . Sexual activity: Yes    Partners: Male   Other Topics Concern  . Not on file   Social History Narrative  . No narrative on file    Past Surgical History:  Procedure Laterality Date  . LACERATION REPAIR     age 75, dog attack, stitches in face, multipe sites right side of face    Family History  Problem Relation Age of Onset  . Heart attack Father 26  . Hypertension Father   . Hyperlipidemia Father   . Heart disease Father 62    MI  . Varicose Veins Father   . Hypertension Sister    . Ovarian cysts Sister   . Hyperlipidemia Sister   . Heart attack Brother 43  . Hypertension Brother   . Heart disease Brother   . Hyperlipidemia Brother   . Anxiety disorder Daughter   . OCD Daughter   . Heart attack Maternal Grandmother   . Hypertension Maternal Grandmother     valvular heart disease  . Cancer Maternal Grandfather     skin, bcc  . Glaucoma Maternal Grandfather   . Stroke Paternal Grandfather   . Cancer Mother     skin, bcc, scc  . Heart disease Mother     arrythmia  . Hyperlipidemia Mother   . Colon cancer Neg Hx     Allergies  Allergen Reactions  . Penicillins Rash  . Clindamycin Hcl Rash    Current Outpatient Prescriptions on File Prior to Visit  Medication Sig Dispense Refill  . aspirin 81 MG tablet Take 81 mg by mouth daily.    . Calcium-Cholecalciferol (VITA-CALCIUM PO) Take by mouth daily. 1 chewable    . Calcium-Magnesium-Vitamin D (CITRACAL CALCIUM+D PO) Take by mouth daily.    . Coenzyme Q10-Red Yeast Rice (CO Q-10 PLUS RED YEAST RICE) 60-600 MG CAPS Take by  mouth.    . fish oil-omega-3 fatty acids 1000 MG capsule Take 1 g by mouth daily.     . Flaxseed Oil OIL Take 1,400 mg by mouth daily.    Marland Kitchen. latanoprost (XALATAN) 0.005 % ophthalmic solution Place 1 drop into both eyes at bedtime.    . multivitamin-iron-minerals-folic acid (CENTRUM) chewable tablet Chew 1 tablet by mouth daily.    . Probiotic Product (PROBIOTIC PO) Take 1 tablet by mouth.     . psyllium (REGULOID) 0.52 G capsule Take 2 capsules by mouth daily.     No current facility-administered medications on file prior to visit.     BP (!) 149/77 (BP Location: Right Arm, Patient Position: Sitting, Cuff Size: Normal)   Pulse (!) 102   Temp 97.5 F (36.4 C) (Oral)   Ht 5\' 4"  (1.626 m)   Wt 147 lb 3.2 oz (66.8 kg)   SpO2 100%   BMI 25.27 kg/m       Objective:   Physical Exam  Constitutional: She is oriented to person, place, and time. She appears well-developed and  well-nourished.  HENT:  Head: Normocephalic and atraumatic.  Cardiovascular: Normal rate, regular rhythm and normal heart sounds.   No murmur heard. Pulmonary/Chest: Effort normal and breath sounds normal. No respiratory distress. She has no wheezes.  Musculoskeletal: She exhibits no edema.  Neurological: She is alert and oriented to person, place, and time.  Bilateral LE strength is 5/5    Skin:  Several small bruises noted  On bilateral legs.    Psychiatric: She has a normal mood and affect. Her behavior is normal. Judgment and thought content normal.  vascular:  Multiple spider veins noted on bilateral thighs,  Varicose vein noted overlying left knee cap.  Mild soft tissue tenderness to palpation left posterior thigh without induration or erythema        Assessment & Plan:  Varicose veins- suspect pain and tingling is most likely related to venous insufficiency. We discussed use of support hose and elevation of legs when able. Will obtain b12 and folate levels to ensure no deficiency given reports of "tingling."

## 2016-03-26 NOTE — Patient Instructions (Signed)
Please consider purchasing thigh high compression stockings and wearing during the day. Call if new/worsening symptoms or if symptoms are not improved in 1 week.  Complete lab work prior to leaving.

## 2016-10-23 ENCOUNTER — Encounter: Payer: Self-pay | Admitting: Family Medicine

## 2016-10-23 ENCOUNTER — Ambulatory Visit (HOSPITAL_BASED_OUTPATIENT_CLINIC_OR_DEPARTMENT_OTHER)
Admission: RE | Admit: 2016-10-23 | Discharge: 2016-10-23 | Disposition: A | Discharge status: Home / Self Care | Payer: BLUE CROSS/BLUE SHIELD | Source: Ambulatory Visit | Attending: Family Medicine | Admitting: Family Medicine

## 2016-10-23 ENCOUNTER — Other Ambulatory Visit (HOSPITAL_COMMUNITY)
Admission: RE | Admit: 2016-10-23 | Discharge: 2016-10-23 | Disposition: A | Discharge status: Home / Self Care | Payer: BLUE CROSS/BLUE SHIELD | Source: Ambulatory Visit | Attending: Family Medicine | Admitting: Family Medicine

## 2016-10-23 ENCOUNTER — Ambulatory Visit (INDEPENDENT_AMBULATORY_CARE_PROVIDER_SITE_OTHER): Payer: BLUE CROSS/BLUE SHIELD | Admitting: Family Medicine

## 2016-10-23 DIAGNOSIS — N952 Postmenopausal atrophic vaginitis: Secondary | ICD-10-CM | POA: Diagnosis not present

## 2016-10-23 DIAGNOSIS — Z1231 Encounter for screening mammogram for malignant neoplasm of breast: Secondary | ICD-10-CM | POA: Insufficient documentation

## 2016-10-23 DIAGNOSIS — E785 Hyperlipidemia, unspecified: Secondary | ICD-10-CM

## 2016-10-23 DIAGNOSIS — Z01419 Encounter for gynecological examination (general) (routine) without abnormal findings: Secondary | ICD-10-CM | POA: Insufficient documentation

## 2016-10-23 DIAGNOSIS — L578 Other skin changes due to chronic exposure to nonionizing radiation: Secondary | ICD-10-CM | POA: Diagnosis not present

## 2016-10-23 DIAGNOSIS — Z0000 Encounter for general adult medical examination without abnormal findings: Secondary | ICD-10-CM | POA: Diagnosis not present

## 2016-10-23 DIAGNOSIS — Z1239 Encounter for other screening for malignant neoplasm of breast: Secondary | ICD-10-CM

## 2016-10-23 DIAGNOSIS — Z124 Encounter for screening for malignant neoplasm of cervix: Secondary | ICD-10-CM

## 2016-10-23 DIAGNOSIS — R739 Hyperglycemia, unspecified: Secondary | ICD-10-CM | POA: Diagnosis not present

## 2016-10-23 DIAGNOSIS — R000 Tachycardia, unspecified: Secondary | ICD-10-CM

## 2016-10-23 DIAGNOSIS — D649 Anemia, unspecified: Secondary | ICD-10-CM | POA: Diagnosis not present

## 2016-10-23 LAB — COMPREHENSIVE METABOLIC PANEL
ALT: 13 U/L (ref 0–35)
AST: 21 U/L (ref 0–37)
Albumin: 4.6 g/dL (ref 3.5–5.2)
Alkaline Phosphatase: 76 U/L (ref 39–117)
BILIRUBIN TOTAL: 0.5 mg/dL (ref 0.2–1.2)
BUN: 15 mg/dL (ref 6–23)
CALCIUM: 10.1 mg/dL (ref 8.4–10.5)
CHLORIDE: 100 meq/L (ref 96–112)
CO2: 32 meq/L (ref 19–32)
Creatinine, Ser: 0.86 mg/dL (ref 0.40–1.20)
GFR: 72.67 mL/min (ref >60.00)
Glucose, Bld: 96 mg/dL (ref 70–99)
Potassium: 4.5 mEq/L (ref 3.5–5.1)
Sodium: 139 mEq/L (ref 135–145)
Total Protein: 7.5 g/dL (ref 6.0–8.3)

## 2016-10-23 LAB — CBC
HEMATOCRIT: 42.5 % (ref 36.0–46.0)
HEMOGLOBIN: 13.8 g/dL (ref 12.0–15.0)
MCHC: 32.6 g/dL (ref 30.0–36.0)
MCV: 95.2 fl (ref 78.0–100.0)
Platelets: 204 10*3/uL (ref 150.0–400.0)
RBC: 4.46 Mil/uL (ref 3.87–5.11)
RDW: 12.6 % (ref 11.5–15.5)
WBC: 5.7 10*3/uL (ref 4.0–10.5)

## 2016-10-23 LAB — LIPID PANEL
CHOLESTEROL: 240 mg/dL — AB (ref 0–200)
HDL: 110.9 mg/dL (ref >39.00)
LDL Cholesterol: 108 mg/dL — ABNORMAL HIGH (ref 0–99)
NonHDL: 128.66
Total CHOL/HDL Ratio: 2
Triglycerides: 102 mg/dL (ref 0.0–149.0)
VLDL: 20.4 mg/dL (ref 0.0–40.0)

## 2016-10-23 LAB — TSH: TSH: 1.18 u[IU]/mL (ref 0.35–4.50)

## 2016-10-23 LAB — HEMOGLOBIN A1C: HEMOGLOBIN A1C: 5.8 % (ref 4.6–6.5)

## 2016-10-23 NOTE — Assessment & Plan Note (Signed)
Pap today, no concerns on exam.  

## 2016-10-23 NOTE — Assessment & Plan Note (Signed)
Encouraged heart healthy diet, increase exercise, avoid trans fats, consider a krill oil cap daily 

## 2016-10-23 NOTE — Progress Notes (Signed)
Subjective:  I acted as a Neurosurgeon for Dr. Abner Greenspan. [place], [place]  Patient ID: [name], female    DOB: 1960-12-11, 56 y.o.   MRN: [id number]  Chief Complaint  Patient presents with  . Annual Exam  . Rash    Right side temple rash  . Skin Problem    HPI  Patient is in today for an annual exam and follow up on chronic medical conditions. She feels well today. No recent febrile illness or acute hospitalization.s he has been very busy moving and dealing with grandparents passing etc so she has not been exercising regularly. She is eating well. Denies CP/palp/SOB/HA/congestion/fevers/GI or GU c/o. Taking meds as prescribed Patient Care Team: Bradd Canary, MD as PCP - General (Family Medicine) Iva Boop, MD as Consulting Physician (Gastroenterology)   Past Medical History:  Diagnosis Date  . Allergic state 04/12/2011  . Anemia    menorraghia  . Breast mass, left 03/21/2011  . Chest pain 03/12/2011  . Chicken pox as a child  . Elevated antinuclear antibody (ANA) level 03/12/2011  . Elevated BP 03/12/2011  . Glaucoma 03/12/2011  . Heart murmur   . History of UTI   . Hyperglycemia 03/12/2011  . Hyperlipidemia 03/12/2011  . Palpitations   . Perimenopausal 10/05/2012  . Preventative health care 10/12/2013  . Refusal of blood transfusions as patient is Jehovah's Witness 10/12/2014  . Screening for cervical cancer 03/21/2011  . SOM (serous otitis media) 04/12/2011  . Tachycardia 03/21/2011    Past Surgical History:  Procedure Laterality Date  . LACERATION REPAIR     age 75, dog attack, stitches in face, multipe sites right side of face    Family History  Problem Relation Age of Onset  . Heart attack Father 42  . Hypertension Father   . Hyperlipidemia Father   . Heart disease Father 80       MI  . Varicose Veins Father   . Hypertension Sister   . Ovarian cysts Sister   . Hyperlipidemia Sister   . Heart attack Brother 43  . Hypertension Brother   . Heart disease Brother   .  Hyperlipidemia Brother   . Anxiety disorder Daughter   . OCD Daughter   . Heart attack Maternal Grandmother   . Hypertension Maternal Grandmother        valvular heart disease  . Heart disease Maternal Grandmother   . Vascular Disease Maternal Grandmother   . Cancer Maternal Grandfather        skin, bcc  . Glaucoma Maternal Grandfather   . Stroke Paternal Grandfather   . Cancer Mother        skin, bcc, scc  . Heart disease Mother        arrythmia  . Hyperlipidemia Mother   . Melanoma Mother   . Colon cancer Neg Hx     Social History   Social History  . Marital status: Married    Spouse name: N/A  . Number of children: N/A  . Years of education: N/A   Occupational History  . Not on file.   Social History Main Topics  . Smoking status: Never Smoker  . Smokeless tobacco: Never Used  . Alcohol use No  . Drug use: No  . Sexual activity: Yes    Partners: Male   Other Topics Concern  . Not on file   Social History Narrative  . No narrative on file    Outpatient Medications Prior to Visit  Medication  Sig Dispense Refill  . aspirin 81 MG tablet Take 81 mg by mouth daily.    . Calcium-Magnesium-Vitamin D (CITRACAL CALCIUM+D PO) Take by mouth daily.    . Coenzyme Q10-Red Yeast Rice (CO Q-10 PLUS RED YEAST RICE) 60-600 MG CAPS Take by mouth.    . Flaxseed Oil OIL Take 1,400 mg by mouth daily.    Marland Kitchen latanoprost (XALATAN) 0.005 % ophthalmic solution Place 1 drop into both eyes at bedtime.    . multivitamin-iron-minerals-folic acid (CENTRUM) chewable tablet Chew 1 tablet by mouth daily.    . Probiotic Product (PROBIOTIC PO) Take 1 tablet by mouth.     . psyllium (REGULOID) 0.52 G capsule Take 2 capsules by mouth daily.    . Calcium-Cholecalciferol (VITA-CALCIUM PO) Take by mouth daily. 1 chewable    . fish oil-omega-3 fatty acids 1000 MG capsule Take 1 g by mouth daily.      No facility-administered medications prior to visit.     Allergies  Allergen Reactions  .  Penicillins Rash  . Clindamycin Hcl Rash    Review of Systems  Constitutional: Negative for fever and malaise/fatigue.  HENT: Negative for congestion.   Eyes: Negative for blurred vision.  Respiratory: Negative for shortness of breath.   Cardiovascular: Negative for chest pain, palpitations and leg swelling.  Gastrointestinal: Negative for abdominal pain, blood in stool and nausea.  Genitourinary: Negative for dysuria and frequency.  Musculoskeletal: Negative for falls.  Skin: Negative for rash.  Neurological: Negative for dizziness, loss of consciousness and headaches.  Endo/Heme/Allergies: Negative for environmental allergies.  Psychiatric/Behavioral: Negative for depression. The patient is not nervous/anxious.        Objective:    Physical Exam  Constitutional: She is oriented to person, place, and time. She appears well-developed and well-nourished. No distress.  HENT:  Head: Normocephalic and atraumatic.  Right Ear: External ear normal.  Left Ear: External ear normal.  Nose: Nose normal.  Mouth/Throat: Oropharynx is clear and moist.  Eyes: Pupils are equal, round, and reactive to light. Conjunctivae and EOM are normal. Right eye exhibits no discharge. Left eye exhibits no discharge.  Neck: Normal range of motion. Neck supple. No JVD present. No thyromegaly present.  Cardiovascular: Normal rate, regular rhythm, normal heart sounds and intact distal pulses.   No murmur heard. Pulmonary/Chest: Effort normal and breath sounds normal. No respiratory distress. She has no wheezes. She has no rales. She exhibits no tenderness.  Abdominal: Soft. Bowel sounds are normal. She exhibits no distension and no mass. There is no tenderness. There is no rebound and no guarding.  Genitourinary: Vagina normal and uterus normal. No vaginal discharge found.  Genitourinary Comments: Thin vaginal mucosa  Musculoskeletal: Normal range of motion. She exhibits no edema or tenderness.    Lymphadenopathy:    She has no cervical adenopathy.  Neurological: She is alert and oriented to person, place, and time. She has normal reflexes. No cranial nerve deficit.  Skin: Skin is warm and dry. No rash noted. She is not diaphoretic. No erythema.  Light brown flat irregular lesion with raised central portion left temple  Psychiatric: She has a normal mood and affect. Her behavior is normal. Judgment and thought content normal.  Nursing note and vitals reviewed.   There were no vitals taken for this visit. Wt Readings from Last 3 Encounters:  03/26/16 147 lb 3.2 oz (66.8 kg)  10/18/15 152 lb (68.9 kg)  10/12/14 146 lb 2 oz (66.3 kg)   BP Readings from Last  3 Encounters:  03/26/16 112/75  10/18/15 140/67  10/12/14 122/80     Immunization History  Administered Date(s) Administered  . Tdap 04/07/2013    Health Maintenance  Topic Date Due  . HIV Screening  04/13/1976  . INFLUENZA VACCINE  10/23/2017 (Originally 09/05/2016)  . PAP SMEAR  10/11/2017  . MAMMOGRAM  10/24/2018  . TETANUS/TDAP  04/08/2023  . COLONOSCOPY  06/20/2023  . Hepatitis C Screening  Completed    Lab Results  Component Value Date   WBC 5.7 10/23/2016   HGB 13.8 10/23/2016   HCT 42.5 10/23/2016   PLT 204.0 10/23/2016   GLUCOSE 96 10/23/2016   CHOL 240 (H) 10/23/2016   TRIG 102.0 10/23/2016   HDL 110.90 10/23/2016   LDLDIRECT 122.7 03/26/2012   LDLCALC 108 (H) 10/23/2016   ALT 13 10/23/2016   AST 21 10/23/2016   NA 139 10/23/2016   K 4.5 10/23/2016   CL 100 10/23/2016   CREATININE 0.86 10/23/2016   BUN 15 10/23/2016   CO2 32 10/23/2016   TSH 1.18 10/23/2016   HGBA1C 5.8 10/23/2016    Lab Results  Component Value Date   TSH 1.18 10/23/2016   Lab Results  Component Value Date   WBC 5.7 10/23/2016   HGB 13.8 10/23/2016   HCT 42.5 10/23/2016   MCV 95.2 10/23/2016   PLT 204.0 10/23/2016   Lab Results  Component Value Date   NA 139 10/23/2016   K 4.5 10/23/2016   CO2 32 10/23/2016    GLUCOSE 96 10/23/2016   BUN 15 10/23/2016   CREATININE 0.86 10/23/2016   BILITOT 0.5 10/23/2016   ALKPHOS 76 10/23/2016   AST 21 10/23/2016   ALT 13 10/23/2016   PROT 7.5 10/23/2016   ALBUMIN 4.6 10/23/2016   CALCIUM 10.1 10/23/2016   GFR 72.67 10/23/2016   Lab Results  Component Value Date   CHOL 240 (H) 10/23/2016   Lab Results  Component Value Date   HDL 110.90 10/23/2016   Lab Results  Component Value Date   LDLCALC 108 (H) 10/23/2016   Lab Results  Component Value Date   TRIG 102.0 10/23/2016   Lab Results  Component Value Date   CHOLHDL 2 10/23/2016   Lab Results  Component Value Date   HGBA1C 5.8 10/23/2016         Assessment & Plan:   Problem List Items Addressed This Visit    Anemia    Increase leafy greens, consider increased lean red meat and using cast iron cookware. Continue to monitor, report any concerns      Relevant Medications   IRON, FERROUS GLUCONATE, PO   Other Relevant Orders   CBC (Completed)   Hyperlipidemia    Encouraged heart healthy diet, increase exercise, avoid trans fats, consider a krill oil cap daily      Relevant Orders   Lipid panel (Completed)   Hyperglycemia    hgba1c acceptable, minimize simple carbs. Increase exercise as tolerated.       Relevant Orders   Comprehensive metabolic panel (Completed)   Hemoglobin A1c (Completed)   Cervical cancer screening    Pap today, no concerns on exam.       Relevant Orders   Cytology - PAP   Tachycardia    Mild today check labs      Relevant Orders   TSH (Completed)   Atrophic vaginitis    Getting drier is offered a trial of Premarin cream will call if she would like to try  Preventative health care    Patient encouraged to maintain heart healthy diet, regular exercise, adequate sleep. Consider daily probiotics. Take medications as prescribed. Pap done, colonoscopy UTD and MGM ordered      Sun-damaged skin - Primary    Changing lesion on left temple and  diffuse age related changes. Referred to dermatology for further evaluation      Relevant Orders   Ambulatory referral to Dermatology    Other Visit Diagnoses    Breast cancer screening       Relevant Orders   MM SCREENING BREAST TOMO BILATERAL (Completed)      I have discontinued Ms. Adderly's Calcium-Cholecalciferol (VITA-CALCIUM PO) and fish oil-omega-3 fatty acids. I am also having her maintain her Flaxseed Oil, multivitamin-iron-minerals-folic acid, Calcium-Magnesium-Vitamin D (CITRACAL CALCIUM+D PO), latanoprost, aspirin, psyllium, Probiotic Product (PROBIOTIC PO), Coenzyme Q10-Red Yeast Rice, Omega-3, loratadine-pseudoephedrine, and (IRON, FERROUS GLUCONATE, PO).  Meds ordered this encounter  Medications  . Omega-3 350 MG CPDR    Sig: Take by mouth.  . loratadine-pseudoephedrine (CLARITIN-D 24-HOUR) 10-240 MG 24 hr tablet    Sig: Take 1 tablet by mouth daily.  . IRON, FERROUS GLUCONATE, PO    Sig: Take 65 mg by mouth.    CMA served as Neurosurgeon during this visit. History, Physical and Plan performed by medical provider. Documentation and orders reviewed and attested to.  Danise Edge, MD

## 2016-10-23 NOTE — Assessment & Plan Note (Signed)
Increase leafy greens, consider increased lean red meat and using cast iron cookware. Continue to monitor, report any concerns 

## 2016-10-23 NOTE — Assessment & Plan Note (Signed)
Getting drier is offered a trial of Premarin cream will call if she would like to try

## 2016-10-23 NOTE — Assessment & Plan Note (Signed)
Mild today check labs 

## 2016-10-23 NOTE — Assessment & Plan Note (Signed)
hgba1c acceptable, minimize simple carbs. Increase exercise as tolerated.  

## 2016-10-23 NOTE — Patient Instructions (Signed)
shingrix is the new shingles shot, 2 shots over 2-6 months. Can get at pharmacy or call.   Preventive Care 40-64 Years, Female Preventive care refers to lifestyle choices and visits with your health care provider that can promote health and wellness. What does preventive care include?  A yearly physical exam. This is also called an annual well check.  Dental exams once or twice a year.  Routine eye exams. Ask your health care provider how often you should have your eyes checked.  Personal lifestyle choices, including: ? Daily care of your teeth and gums. ? Regular physical activity. ? Eating a healthy diet. ? Avoiding tobacco and drug use. ? Limiting alcohol use. ? Practicing safe sex. ? Taking low-dose aspirin daily starting at age 58. ? Taking vitamin and mineral supplements as recommended by your health care provider. What happens during an annual well check? The services and screenings done by your health care provider during your annual well check will depend on your age, overall health, lifestyle risk factors, and family history of disease. Counseling Your health care provider may ask you questions about your:  Alcohol use.  Tobacco use.  Drug use.  Emotional well-being.  Home and relationship well-being.  Sexual activity.  Eating habits.  Work and work Statistician.  Method of birth control.  Menstrual cycle.  Pregnancy history.  Screening You may have the following tests or measurements:  Height, weight, and BMI.  Blood pressure.  Lipid and cholesterol levels. These may be checked every 5 years, or more frequently if you are over 53 years old.  Skin check.  Lung cancer screening. You may have this screening every year starting at age 14 if you have a 30-pack-year history of smoking and currently smoke or have quit within the past 15 years.  Fecal occult blood test (FOBT) of the stool. You may have this test every year starting at age  60.  Flexible sigmoidoscopy or colonoscopy. You may have a sigmoidoscopy every 5 years or a colonoscopy every 10 years starting at age 21.  Hepatitis C blood test.  Hepatitis B blood test.  Sexually transmitted disease (STD) testing.  Diabetes screening. This is done by checking your blood sugar (glucose) after you have not eaten for a while (fasting). You may have this done every 1-3 years.  Mammogram. This may be done every 1-2 years. Talk to your health care provider about when you should start having regular mammograms. This may depend on whether you have a family history of breast cancer.  BRCA-related cancer screening. This may be done if you have a family history of breast, ovarian, tubal, or peritoneal cancers.  Pelvic exam and Pap test. This may be done every 3 years starting at age 10. Starting at age 28, this may be done every 5 years if you have a Pap test in combination with an HPV test.  Bone density scan. This is done to screen for osteoporosis. You may have this scan if you are at high risk for osteoporosis.  Discuss your test results, treatment options, and if necessary, the need for more tests with your health care provider. Vaccines Your health care provider may recommend certain vaccines, such as:  Influenza vaccine. This is recommended every year.  Tetanus, diphtheria, and acellular pertussis (Tdap, Td) vaccine. You may need a Td booster every 10 years.  Varicella vaccine. You may need this if you have not been vaccinated.  Zoster vaccine. You may need this after age 7.  Measles,  mumps, and rubella (MMR) vaccine. You may need at least one dose of MMR if you were born in 1957 or later. You may also need a second dose.  Pneumococcal 13-valent conjugate (PCV13) vaccine. You may need this if you have certain conditions and were not previously vaccinated.  Pneumococcal polysaccharide (PPSV23) vaccine. You may need one or two doses if you smoke cigarettes or if you  have certain conditions.  Meningococcal vaccine. You may need this if you have certain conditions.  Hepatitis A vaccine. You may need this if you have certain conditions or if you travel or work in places where you may be exposed to hepatitis A.  Hepatitis B vaccine. You may need this if you have certain conditions or if you travel or work in places where you may be exposed to hepatitis B.  Haemophilus influenzae type b (Hib) vaccine. You may need this if you have certain conditions.  Talk to your health care provider about which screenings and vaccines you need and how often you need them. This information is not intended to replace advice given to you by your health care provider. Make sure you discuss any questions you have with your health care provider. Document Released: 02/18/2015 Document Revised: 10/12/2015 Document Reviewed: 11/23/2014 Elsevier Interactive Patient Education  2017 [employer].

## 2016-10-24 DIAGNOSIS — L578 Other skin changes due to chronic exposure to nonionizing radiation: Secondary | ICD-10-CM

## 2016-10-24 HISTORY — DX: Other skin changes due to chronic exposure to nonionizing radiation: L57.8

## 2016-10-24 NOTE — Assessment & Plan Note (Signed)
Changing lesion on left temple and diffuse age related changes. Referred to dermatology for further evaluation

## 2016-10-24 NOTE — Assessment & Plan Note (Addendum)
Patient encouraged to maintain heart healthy diet, regular exercise, adequate sleep. Consider daily probiotics. Take medications as prescribed. Pap done, colonoscopy UTD and MGM ordered

## 2016-10-25 LAB — CYTOLOGY - PAP: DIAGNOSIS: NEGATIVE

## 2017-01-07 ENCOUNTER — Telehealth: Payer: Self-pay | Admitting: Medical

## 2017-01-07 ENCOUNTER — Encounter: Payer: Self-pay | Admitting: Medical

## 2017-01-07 ENCOUNTER — Ambulatory Visit (HOSPITAL_BASED_OUTPATIENT_CLINIC_OR_DEPARTMENT_OTHER)
Admission: RE | Admit: 2017-01-07 | Discharge: 2017-01-07 | Disposition: A | Discharge status: Home / Self Care | Payer: BLUE CROSS/BLUE SHIELD | Source: Ambulatory Visit | Attending: Medical | Admitting: Medical

## 2017-01-07 ENCOUNTER — Ambulatory Visit: Payer: BLUE CROSS/BLUE SHIELD | Admitting: Medical

## 2017-01-07 VITALS — BP 151/68 | HR 102 | Temp 98.1°F | Resp 16 | Ht 64.0 in

## 2017-01-07 DIAGNOSIS — R937 Abnormal findings on diagnostic imaging of other parts of musculoskeletal system: Secondary | ICD-10-CM | POA: Diagnosis not present

## 2017-01-07 DIAGNOSIS — M79609 Pain in unspecified limb: Secondary | ICD-10-CM

## 2017-01-07 DIAGNOSIS — M25569 Pain in unspecified knee: Secondary | ICD-10-CM | POA: Diagnosis not present

## 2017-01-07 DIAGNOSIS — S92902A Unspecified fracture of left foot, initial encounter for closed fracture: Secondary | ICD-10-CM

## 2017-01-07 DIAGNOSIS — M79672 Pain in left foot: Secondary | ICD-10-CM | POA: Diagnosis not present

## 2017-01-07 LAB — CBC WITH DIFFERENTIAL/PLATELET
Basophils Absolute: 0 10*3/uL (ref 0.0–0.1)
Basophils Relative: 0.2 % (ref 0.0–3.0)
EOS PCT: 0.3 % (ref 0.0–5.0)
Eosinophils Absolute: 0 10*3/uL (ref 0.0–0.7)
HCT: 41.8 % (ref 36.0–46.0)
HEMOGLOBIN: 13.8 g/dL (ref 12.0–15.0)
Lymphocytes Relative: 21.6 % (ref 12.0–46.0)
Lymphs Abs: 1.4 10*3/uL (ref 0.7–4.0)
MCHC: 32.9 g/dL (ref 30.0–36.0)
MCV: 96.5 fl (ref 78.0–100.0)
MONO ABS: 0.3 10*3/uL (ref 0.1–1.0)
MONOS PCT: 5 % (ref 3.0–12.0)
Neutro Abs: 4.7 10*3/uL (ref 1.4–7.7)
Neutrophils Relative %: 72.9 % (ref 43.0–77.0)
Platelets: 219 10*3/uL (ref 150.0–400.0)
RBC: 4.33 Mil/uL (ref 3.87–5.11)
RDW: 12.7 % (ref 11.5–15.5)
WBC: 6.5 10*3/uL (ref 4.0–10.5)

## 2017-01-07 NOTE — Progress Notes (Signed)
Subjective:    Patient ID: [name], female    DOB: 08-29-1960, 56 y.o.   MRN: [id number]  HPI    Pt in with some swelling to her left foot. Swelling since Tuesday. When she put pressure will have pain at distal metatarsal area and top aspect of foot. No trauma or fall. No fevers, no chills or sweats.  Pain on her foot when ambulates. Pain level 6/10. At rest for prolonged periods no pain Pt states this is first time her feet have swollen.  The day she had pain onset was washing windows.   Pt has history of some prominent large varicose veins on left side.   Pt on exam reported popliteal pain on exam. No history of chronic popliteal pain in past. Some prominent veins of her left posterior calf area not new.    Review of Systems  Constitutional: Negative for chills, fatigue and fever.  Respiratory: Negative for cough, chest tightness, shortness of breath and wheezing.   Cardiovascular: Negative for chest pain and palpitations.  Musculoskeletal: Negative for back pain, gait problem and joint swelling.       Left foot pain. Popliteal pain.  Neurological: Negative for dizziness, weakness, numbness and headaches.  Hematological: Negative for adenopathy. Does not bruise/bleed easily.  Psychiatric/Behavioral: Negative for behavioral problems, confusion and decreased concentration.    Past Medical History:  Diagnosis Date  . Allergic state 04/12/2011  . Anemia    menorraghia  . Breast mass, left 03/21/2011  . Chest pain 03/12/2011  . Chicken pox as a child  . Elevated antinuclear antibody (ANA) level 03/12/2011  . Elevated BP 03/12/2011  . Glaucoma 03/12/2011  . Heart murmur   . History of UTI   . Hyperglycemia 03/12/2011  . Hyperlipidemia 03/12/2011  . Palpitations   . Perimenopausal 10/05/2012  . Preventative health care 10/12/2013  . Refusal of blood transfusions as patient is Jehovah's Witness 10/12/2014  . Screening for cervical cancer 03/21/2011  . SOM (serous otitis media)  04/12/2011  . Tachycardia 03/21/2011     Social History   Socioeconomic History  . Marital status: Married    Spouse name: Not on file  . Number of children: Not on file  . Years of education: Not on file  . Highest education level: Not on file  Social Needs  . Financial resource strain: Not on file  . Food insecurity - worry: Not on file  . Food insecurity - inability: Not on file  . Transportation needs - medical: Not on file  . Transportation needs - non-medical: Not on file  Occupational History  . Not on file  Tobacco Use  . Smoking status: Never Smoker  . Smokeless tobacco: Never Used  Substance and Sexual Activity  . Alcohol use: No  . Drug use: No  . Sexual activity: Yes    Partners: Male  Other Topics Concern  . Not on file  Social History Narrative  . Not on file    Past Surgical History:  Procedure Laterality Date  . LACERATION REPAIR     age 87, dog attack, stitches in face, multipe sites right side of face    Family History  Problem Relation Age of Onset  . Heart attack Father 58  . Hypertension Father   . Hyperlipidemia Father   . Heart disease Father 73       MI  . Varicose Veins Father   . Hypertension Sister   . Ovarian cysts Sister   . Hyperlipidemia  Sister   . Heart attack Brother 43  . Hypertension Brother   . Heart disease Brother   . Hyperlipidemia Brother   . Anxiety disorder Daughter   . OCD Daughter   . Heart attack Maternal Grandmother   . Hypertension Maternal Grandmother        valvular heart disease  . Heart disease Maternal Grandmother   . Vascular Disease Maternal Grandmother   . Cancer Maternal Grandfather        skin, bcc  . Glaucoma Maternal Grandfather   . Stroke Paternal Grandfather   . Cancer Mother        skin, bcc, scc  . Heart disease Mother        arrythmia  . Hyperlipidemia Mother   . Melanoma Mother   . Colon cancer Neg Hx     Allergies  Allergen Reactions  . Penicillins Rash  . Clindamycin Hcl Rash      Current Outpatient Medications on File Prior to Visit  Medication Sig Dispense Refill  . aspirin 81 MG tablet Take 81 mg by mouth daily.    . Calcium-Magnesium-Vitamin D (CITRACAL CALCIUM+D PO) Take by mouth daily.    . Coenzyme Q10-Red Yeast Rice (CO Q-10 PLUS RED YEAST RICE) 60-600 MG CAPS Take by mouth.    . Flaxseed Oil OIL Take 1,400 mg by mouth daily.    . IRON, FERROUS GLUCONATE, PO Take 65 mg by mouth.    . latanoprost (XALATAN) 0.005 % ophthalmic solution Place 1 drop into both eyes at bedtime.    Marland Kitchen. loratadine-pseudoephedrine (CLARITIN-D 24-HOUR) 10-240 MG 24 hr tablet Take 1 tablet by mouth daily.    . multivitamin-iron-minerals-folic acid (CENTRUM) chewable tablet Chew 1 tablet by mouth daily.    . Omega-3 350 MG CPDR Take by mouth.    . Probiotic Product (PROBIOTIC PO) Take 1 tablet by mouth.     . psyllium (REGULOID) 0.52 G capsule Take 2 capsules by mouth daily.     No current facility-administered medications on file prior to visit.     BP (!) 151/68   Pulse (!) 102   Temp 98.1 F (36.7 C) (Oral)   Resp 16   Ht 5\' 4"  (1.626 m)   SpO2 100%   BMI 25.27 kg/m       Objective:   Physical Exam   General- No acute distress. Pleasant patient. Neck- Full range of motion, no jvd Lungs- Clear, even and unlabored. Heart- regular rate and rhythm. Neurologic- CNII- XII grossly intact.  Lower ext- left foot distal metatarsal aspect moderate swollen and faint tender. Not warm.  Left calf- symmetric compared to rt side. But moderate varicose veins seen Left popliteal area- moderate bulge but no homans signs.       Assessment & Plan:  For your left foot pain we will get x-ray, CBC and uric acid.  Etiology of pain is not known at this point.  You may have suffered a slight strain injury or possibly experienced tendinitis.  I do think assessing infection fighting cells and uric acid is reasonable in light of no obvious excessive activity.  For your popliteal pulse and  pain on exam, I want to get left lower extremity ultrasound.  If this may reveal Baker's cyst versus DVT.  Presently with a mild pain and swelling of the foot, recommend continuing ibuprofen dosing could be 400-800 mg 3 times daily.  I gave you the small Ace wrap and should you have to wrap the distal foot.  I think compression might help with pain.  Late in the evening recommend taking off Ace wrap each day and notify us if any color changes to the foot, any warmth or increasing pain.  Follow-up in 7-10 days or as needed.  Ottavio Norem, Ramon DredgeEdward, PA-C

## 2017-01-07 NOTE — Patient Instructions (Signed)
For your left foot pain we will get x-ray, CBC and uric acid.  Etiology of pain is not known at this point.  You may have suffered a slight strain injury or possibly experienced tendinitis.  I do think assessing infection fighting cells and uric acid is reasonable in light of no obvious excessive activity.  For your popliteal pulse and pain on exam, I want to get left lower extremity ultrasound.  If this may reveal Baker's cyst versus DVT.  Presently with a mild pain and swelling of the foot, recommend continuing ibuprofen dosing could be 400-800 mg 3 times daily.  I gave you the small Ace wrap and should you have to wrap the distal foot.  I think compression might help with pain.  Late in the evening recommend taking off Ace wrap each day and notify us if any color changes to the foot, any warmth or increasing pain.  Follow-up in 7-10 days or as needed.

## 2017-01-07 NOTE — Telephone Encounter (Signed)
Fracture of left foot by xray. Refer to sports med

## 2017-01-08 ENCOUNTER — Telehealth: Payer: Self-pay | Admitting: Family Medicine

## 2017-01-08 ENCOUNTER — Encounter: Payer: Self-pay | Admitting: Family Medicine

## 2017-01-08 ENCOUNTER — Ambulatory Visit (INDEPENDENT_AMBULATORY_CARE_PROVIDER_SITE_OTHER): Payer: BLUE CROSS/BLUE SHIELD | Admitting: Family Medicine

## 2017-01-08 DIAGNOSIS — M79672 Pain in left foot: Secondary | ICD-10-CM | POA: Diagnosis not present

## 2017-01-08 LAB — URIC ACID: Uric Acid, Serum: 4.6 mg/dL (ref 2.4–7.0)

## 2017-01-08 NOTE — Patient Instructions (Signed)
You have a metatarsal stress fracture of your second metatarsal. Wear boot when up and walking around (it's ok to put weight on this in the boot). Ice the area 15 minutes at a time 3-4 times a day. Tylenol and/or ibuprofen if needed for pain. Follow up with me in 5 weeks when you're 6 weeks out from this. We will do a bone density test in the future. You should take calcium 1300mg  daily and vitamin D 800 IU daily.

## 2017-01-08 NOTE — Telephone Encounter (Signed)
Copied from CRM [phone]. Topic: Quick Communication - See Telephone Encounter >> Jan 08, 2017  4:47 PM Terisa Starraylor, Brittany L wrote: CRM for notification. See Telephone encounter for:  Patient is wanting her lab results from yesterday. Please call her at [phone] 01/08/17.

## 2017-01-09 NOTE — Telephone Encounter (Signed)
Called pt informed her of her results.

## 2017-01-10 ENCOUNTER — Encounter: Payer: Self-pay | Admitting: Family Medicine

## 2017-01-10 DIAGNOSIS — M79672 Pain in left foot: Secondary | ICD-10-CM | POA: Insufficient documentation

## 2017-01-10 HISTORY — DX: Pain in left foot: M79.672

## 2017-01-10 NOTE — Assessment & Plan Note (Signed)
Left 2nd metatarsal stress fracture - confirmed with ultrasound.  Cam walker when up and walking around.  Icing, tylenol and/or ibuprofen if needed.  Calcium and vitamin D.  Plan to do bone density test in future.  F/u when 6 weeks out.

## 2017-01-10 NOTE — Progress Notes (Signed)
PCP: Bradd CanaryBlyth, Stacey A, MD Consultation requested by Esperanza RichtersEdward Saguier PA-C  Subjective:   HPI: Patient is a 56 y.o. female here for left foot pain.  Patient denies known injury or trauma. No known history of osteoporosis. 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 Pain worse with standing, dull, noticed swelling. Has been elevating and taking ibuprofen. Current pain 0/10 at rest. No skin changes, numbness.  Past Medical History:  Diagnosis Date  . Allergic state 04/12/2011  . Anemia    menorraghia  . Breast mass, left 03/21/2011  . Chest pain 03/12/2011  . Chicken pox as a child  . Elevated antinuclear antibody (ANA) level 03/12/2011  . Elevated BP 03/12/2011  . Glaucoma 03/12/2011  . Heart murmur   . History of UTI   . Hyperglycemia 03/12/2011  . Hyperlipidemia 03/12/2011  . Palpitations   . Perimenopausal 10/05/2012  . Preventative health care 10/12/2013  . Refusal of blood transfusions as patient is Jehovah's Witness 10/12/2014  . Screening for cervical cancer 03/21/2011  . SOM (serous otitis media) 04/12/2011  . Tachycardia 03/21/2011    Current Outpatient Medications on File Prior to Visit  Medication Sig Dispense Refill  . aspirin 81 MG tablet Take 81 mg by mouth daily.    . Calcium-Magnesium-Vitamin D (CITRACAL CALCIUM+D PO) Take by mouth daily.    . Coenzyme Q10-Red Yeast Rice (CO Q-10 PLUS RED YEAST RICE) 60-600 MG CAPS Take by mouth.    . Flaxseed Oil OIL Take 1,400 mg by mouth daily.    . IRON, FERROUS GLUCONATE, PO Take 65 mg by mouth.    . latanoprost (XALATAN) 0.005 % ophthalmic solution Place 1 drop into both eyes at bedtime.    Marland Kitchen. loratadine-pseudoephedrine (CLARITIN-D 24-HOUR) 10-240 MG 24 hr tablet Take 1 tablet by mouth daily.    . multivitamin-iron-minerals-folic acid (CENTRUM) chewable tablet Chew 1 tablet by mouth daily.    . Omega-3 350 MG CPDR Take by mouth.    . Probiotic Product (PROBIOTIC PO) Take 1 tablet by mouth.      . psyllium (REGULOID) 0.52 G capsule Take 2 capsules by mouth daily.     No current facility-administered medications on file prior to visit.     Past Surgical History:  Procedure Laterality Date  . LACERATION REPAIR     age 56, dog attack, stitches in face, multipe sites right side of face    Allergies  Allergen Reactions  . Penicillins Rash  . Clindamycin Hcl Rash    Social History   Socioeconomic History  . Marital status: Married    Spouse name: Not on file  . Number of children: Not on file  . Years of education: Not on file  . Highest education level: Not on file  Social Needs  . Financial resource strain: Not on file  . Food insecurity - worry: Not on file  . Food insecurity - inability: Not on file  . Transportation needs - medical: Not on file  . Transportation needs - non-medical: Not on file  Occupational History  . Not on file  Tobacco Use  . Smoking status: Never Smoker  . Smokeless tobacco: Never Used  Substance and Sexual Activity  . Alcohol use: No  . Drug use: No  . Sexual activity: Yes    Partners: Male  Other Topics Concern  . Not on file  Social History Narrative  . Not on file    Family History  Problem Relation Age of Onset  . Heart attack Father [id number]  . Hypertension Father   . Hyperlipidemia Father   . Heart disease Father 2049       MI  . Varicose Veins Father   . Hypertension Sister   . Ovarian cysts Sister   . Hyperlipidemia Sister   . Heart attack Brother 43  . Hypertension Brother   . Heart disease Brother   . Hyperlipidemia Brother   . Anxiety disorder Daughter   . OCD Daughter   . Heart attack Maternal Grandmother   . Hypertension Maternal Grandmother        valvular heart disease  . Heart disease Maternal Grandmother   . Vascular Disease Maternal Grandmother   . Cancer Maternal Grandfather        skin, bcc  . Glaucoma Maternal Grandfather   . Stroke Paternal Grandfather   . Cancer Mother        skin, bcc, scc  .  Heart disease Mother        arrythmia  . Hyperlipidemia Mother   . Melanoma Mother   . Colon cancer Neg Hx     BP 131/81   Pulse 79   Ht 5\' 4"  (1.626 m)   Wt 150 lb (68 kg)   BMI 25.75 kg/m   Review of Systems: See HPI above.     Objective:  Physical Exam:  Gen: NAD, comfortable in exam room  Left foot/ankle: Mild swelling distal dorsal foot. FROM ankle, digits without pain.  5/5 strength ankle motions. TTP dorsally over 2nd metatarsal distally.  No other tenderness. Painful metatarsal squeeze. Negative ant drawer and talar tilt.   Negative syndesmotic compression. Thompsons test negative. NV intact distally.  Right foot/ankle: No gross deformity, swelling, ecchymoses FROM with 5/5 strength all directions No TTP Negative ant drawer and talar tilt.   Negative syndesmotic compression. Thompsons test negative. NV intact distally.   MSK u/s left foot: Cortical irregularity, mild edema and neovascularity noted distal 2nd metatarsal consistent with stress fracture.  Assessment & Plan:  1. Left 2nd metatarsal stress fracture - confirmed with ultrasound.  Cam walker when up and walking around.  Icing, tylenol and/or ibuprofen if needed.  Calcium and vitamin D.  Plan to do bone density test in future.  F/u when 6 weeks out.

## 2017-01-11 ENCOUNTER — Telehealth: Payer: Self-pay

## 2017-01-11 ENCOUNTER — Other Ambulatory Visit: Payer: Self-pay | Admitting: Family Medicine

## 2017-01-11 DIAGNOSIS — E2839 Other primary ovarian failure: Secondary | ICD-10-CM

## 2017-01-11 NOTE — Telephone Encounter (Signed)
[facility] I think she has to call for appointment please lwr hwe knoq

## 2017-01-11 NOTE — Telephone Encounter (Signed)
Copied from CRM [id number]. Topic: General - Other >> Jan 11, 2017  1:11 PM [name], [name] wrote: Reason for CRM: Patient is requesting a Dexa Scan, can this please be placed

## 2017-01-11 NOTE — Telephone Encounter (Signed)
Please advise 

## 2017-01-17 NOTE — Telephone Encounter (Signed)
Bone density scheduled for 01/22/2017.

## 2017-01-22 ENCOUNTER — Ambulatory Visit (HOSPITAL_BASED_OUTPATIENT_CLINIC_OR_DEPARTMENT_OTHER)
Admission: RE | Admit: 2017-01-22 | Discharge: 2017-01-22 | Disposition: A | Discharge status: Home / Self Care | Payer: BLUE CROSS/BLUE SHIELD | Source: Ambulatory Visit | Attending: Family Medicine | Admitting: Family Medicine

## 2017-01-22 DIAGNOSIS — M8588 Other specified disorders of bone density and structure, other site: Secondary | ICD-10-CM | POA: Insufficient documentation

## 2017-01-22 DIAGNOSIS — Z8262 Family history of osteoporosis: Secondary | ICD-10-CM | POA: Insufficient documentation

## 2017-01-22 DIAGNOSIS — Z780 Asymptomatic menopausal state: Secondary | ICD-10-CM | POA: Diagnosis not present

## 2017-01-22 DIAGNOSIS — E2839 Other primary ovarian failure: Secondary | ICD-10-CM

## 2017-01-22 DIAGNOSIS — M85852 Other specified disorders of bone density and structure, left thigh: Secondary | ICD-10-CM | POA: Insufficient documentation

## 2017-01-25 ENCOUNTER — Telehealth: Payer: Self-pay | Admitting: Family Medicine

## 2017-01-25 NOTE — Telephone Encounter (Signed)
Copied from CRM [phone]. Topic: Quick Communication - See Telephone Encounter >> Jan 25, 2017 12:06 PM Terisa Starraylor, Brittany L wrote: CRM for notification. See Telephone encounter for:   01/25/17.  Pt had a bone density done on 12/18. She is wanting the results please call back at [phone]

## 2017-01-25 NOTE — Telephone Encounter (Signed)
Called patient she voiced her understanding

## 2017-01-31 ENCOUNTER — Telehealth: Payer: Self-pay | Admitting: Family Medicine

## 2017-01-31 NOTE — Telephone Encounter (Signed)
Copied from CRM [phone]. Topic: Quick Communication - See Telephone Encounter >> Jan 31, 2017  2:22 PM Diana EvesHoyt, Maryann B wrote: CRM for notification. See Telephone encounter for:  Pt has decided to go ahead and try the estrogen cream that her and Dr. Abner GreenspanBlyth had talked about and is hoping to have it called in before end of year 01/31/17.

## 2017-01-31 NOTE — Telephone Encounter (Signed)
Please advise 

## 2017-02-01 NOTE — Telephone Encounter (Signed)
OK to send her in Premarin cream sig: spply small amount pv 2 x weekly, disp #1 tube with 1 rf.

## 2017-02-01 NOTE — Telephone Encounter (Signed)
It is the 0.625 mg cream it says for insertion but I have people just apply a small amount to labia using finger tip

## 2017-02-01 NOTE — Telephone Encounter (Signed)
Please advise on cream there are different dosages and also there is only one cream for insertion only.

## 2017-02-06 ENCOUNTER — Telehealth: Payer: Self-pay | Admitting: Family Medicine

## 2017-02-06 MED ORDER — ESTROGENS, CONJUGATED 0.625 MG/GM VA CREA: TOPICAL_CREAM | VAGINAL | 1 refills | Status: DC

## 2017-02-06 NOTE — Telephone Encounter (Signed)
Copied from CRM [id number]. Topic: Quick Communication - See Telephone Encounter >> Feb 06, 2017 12:59 PM Windy KalataMichael, Zarriah Starkel L, NT wrote: CRM for notification. See Telephone encounter for:  02/06/17.  [facility] is calling and stating they received a RX today for Premarin. In order for them to refill the 30 gram tube of medication they need to know the quantity in gram per application. Thank you.

## 2017-02-06 NOTE — Telephone Encounter (Signed)
Notified pharmacist of below.

## 2017-02-06 NOTE — Telephone Encounter (Signed)
Spoke with pharmacist and advised per previous phone note, ok to dispense 0.625mg  cream.

## 2017-02-06 NOTE — Addendum Note (Signed)
Addended by: Mervin KungFERGERSON, Rally Ouch A on: 02/06/2017 03:05 PM   Modules accepted: Orders

## 2017-02-12 ENCOUNTER — Ambulatory Visit (INDEPENDENT_AMBULATORY_CARE_PROVIDER_SITE_OTHER): Payer: BLUE CROSS/BLUE SHIELD | Admitting: Family Medicine

## 2017-02-12 ENCOUNTER — Encounter: Payer: Self-pay | Admitting: Family Medicine

## 2017-02-12 DIAGNOSIS — M79672 Pain in left foot: Secondary | ICD-10-CM | POA: Diagnosis not present

## 2017-02-12 NOTE — Patient Instructions (Signed)
Your exam and ultrasound are reassuring. Switch to a supportive shoe for 2 more weeks. Can consider an insert like spencos brand inserts, dr. Jari Sportsmanscholls active series, Hapad comforthotics (online or we have these also) for extra cushion and support. Keep boot with you for a few days just in case you have some soreness. Calcium 1300mg  daily, Vitamin D 800 IU daily. Tylenol or ibuprofen only if needed for soreness. Ok for all activities now without restrictions - start low though (I.e. 10 minutes walking for exercise) and work your way up from here. Follow up with me as needed.  Your finger pain is due to arthritis. These are the different medications you can take for this: Tylenol 500mg  1-2 tabs three times a day for pain. Capsaicin, aspercreme, or biofreeze topically up to four times a day may also help with pain. Some supplements that may help for arthritis: Boswellia extract, curcumin, pycnogenol Aleve 1-2 tabs twice a day with food Cortisone injections are an option. Heat or ice 15 minutes at a time 3-4 times a day as needed to help with pain.

## 2017-02-14 ENCOUNTER — Encounter: Payer: Self-pay | Admitting: Family Medicine

## 2017-02-14 NOTE — Progress Notes (Signed)
PCP: Bradd Canary, MD Consultation requested by Esperanza Richters PA-C  Subjective:   HPI: Patient is a 57 y.o. female here for left foot pain.  12/4: Patient denies known injury or trauma. No known history of osteoporosis. 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 Pain worse with standing, dull, noticed swelling. Has been elevating and taking ibuprofen. Current pain 0/10 at rest. No skin changes, numbness.  02/12/17: Patient reports her foot is much better. She gets twinges at times but overall doing well. Pain level is 0/10 level. Feels like it's more red, discolored underneath her toes. Had bone density test showing osteopenia. Also asked about soreness she gets in fingers at IP joints. No skin changes, numbness.  Past Medical History:  Diagnosis Date  . Allergic state 04/12/2011  . Anemia    menorraghia  . Breast mass, left 03/21/2011  . Chest pain 03/12/2011  . Chicken pox as a child  . Elevated antinuclear antibody (ANA) level 03/12/2011  . Elevated BP 03/12/2011  . Glaucoma 03/12/2011  . Heart murmur   . History of UTI   . Hyperglycemia 03/12/2011  . Hyperlipidemia 03/12/2011  . Palpitations   . Perimenopausal 10/05/2012  . Preventative health care 10/12/2013  . Refusal of blood transfusions as patient is Jehovah's Witness 10/12/2014  . Screening for cervical cancer 03/21/2011  . SOM (serous otitis media) 04/12/2011  . Tachycardia 03/21/2011    Current Outpatient Medications on File Prior to Visit  Medication Sig Dispense Refill  . aspirin 81 MG tablet Take 81 mg by mouth daily.    . Calcium-Magnesium-Vitamin D (CITRACAL CALCIUM+D PO) Take by mouth daily.    . Coenzyme Q10-Red Yeast Rice (CO Q-10 PLUS RED YEAST RICE) 60-600 MG CAPS Take by mouth.    . conjugated estrogens (PREMARIN) vaginal cream 0.625mg  cream.  Apply to vaginal area with fingertip. 42.5 g 1  . Flaxseed Oil OIL Take 1,400 mg by mouth daily.    . IRON, FERROUS  GLUCONATE, PO Take 65 mg by mouth.    . latanoprost (XALATAN) 0.005 % ophthalmic solution Place 1 drop into both eyes at bedtime.    Marland Kitchen loratadine-pseudoephedrine (CLARITIN-D 24-HOUR) 10-240 MG 24 hr tablet Take 1 tablet by mouth daily.    . multivitamin-iron-minerals-folic acid (CENTRUM) chewable tablet Chew 1 tablet by mouth daily.    . Omega-3 350 MG CPDR Take by mouth.    . Probiotic Product (PROBIOTIC PO) Take 1 tablet by mouth.     . psyllium (REGULOID) 0.52 G capsule Take 2 capsules by mouth daily.     No current facility-administered medications on file prior to visit.     Past Surgical History:  Procedure Laterality Date  . LACERATION REPAIR     age 76, dog attack, stitches in face, multipe sites right side of face    Allergies  Allergen Reactions  . Penicillins Rash  . Clindamycin Hcl Rash    Social History   Socioeconomic History  . Marital status: Married    Spouse name: Not on file  . Number of children: Not on file  . Years of education: Not on file  . Highest education level: Not on file  Social Needs  . Financial resource strain: Not on file  . Food insecurity - worry: Not on file  . Food insecurity - inability: Not on file  . Transportation needs - medical: Not on file  . Transportation needs - non-medical: Not on  file  Occupational History  . Not on file  Tobacco Use  . Smoking status: Never Smoker  . Smokeless tobacco: Never Used  Substance and Sexual Activity  . Alcohol use: No  . Drug use: No  . Sexual activity: Yes    Partners: Male  Other Topics Concern  . Not on file  Social History Narrative  . Not on file    Family History  Problem Relation Age of Onset  . Heart attack Father 7149  . Hypertension Father   . Hyperlipidemia Father   . Heart disease Father 8649       MI  . Varicose Veins Father   . Hypertension Sister   . Ovarian cysts Sister   . Hyperlipidemia Sister   . Heart attack Brother 43  . Hypertension Brother   . Heart  disease Brother   . Hyperlipidemia Brother   . Anxiety disorder Daughter   . OCD Daughter   . Heart attack Maternal Grandmother   . Hypertension Maternal Grandmother        valvular heart disease  . Heart disease Maternal Grandmother   . Vascular Disease Maternal Grandmother   . Cancer Maternal Grandfather        skin, bcc  . Glaucoma Maternal Grandfather   . Stroke Paternal Grandfather   . Cancer Mother        skin, bcc, scc  . Heart disease Mother        arrythmia  . Hyperlipidemia Mother   . Melanoma Mother   . Colon cancer Neg Hx     BP 135/85   Pulse 79   Ht 5\' 5"  (1.651 m)   Wt 150 lb (68 kg)   BMI 24.96 kg/m   Review of Systems: See HPI above.     Objective:  Physical Exam:  Gen: NAD, comfortable in exam room.  Left foot/ankle: No gross deformity, swelling, ecchymoses FROM ankle and digits with 5/5 strength. No TTP metatarsals but does have pain with metatarsal squeeze. Negative ant drawer and talar tilt.   Negative syndesmotic compression. Thompsons test negative. NV intact distally.  MSK u/s left foot:  Small callus noted distal 2nd metatarsal.  No neovascularity or edema.  Assessment & Plan:  1. Left 2nd metatarsal stress fracture - clinically improved and also improved by ultrasound.  Discontinue cam walker.  Supportive shoes for 2 more weeks.  Reviewed inserts.  Calcium, vitamin D.  Tylenol or ibuprofen only if needed.  Reviewed how to start and increase activities.  F/u prn.  2. 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  Heat/ice.

## 2017-02-14 NOTE — Assessment & Plan Note (Signed)
Left 2nd metatarsal stress fracture - clinically improved and also improved by ultrasound.  Discontinue cam walker.  Supportive shoes for 2 more weeks.  Reviewed inserts.  Calcium, vitamin D.  Tylenol or ibuprofen only if needed.  Reviewed how to start and increase activities.  F/u prn.

## 2017-08-19 ENCOUNTER — Ambulatory Visit: Payer: BLUE CROSS/BLUE SHIELD | Admitting: Medical

## 2017-08-19 ENCOUNTER — Encounter: Payer: Self-pay | Admitting: Medical

## 2017-08-19 VITALS — BP 141/84 | HR 80 | Temp 98.4°F | Resp 16 | Ht 64.0 in | Wt 154.4 lb

## 2017-08-19 DIAGNOSIS — T7840XA Allergy, unspecified, initial encounter: Secondary | ICD-10-CM | POA: Diagnosis not present

## 2017-08-19 DIAGNOSIS — L089 Local infection of the skin and subcutaneous tissue, unspecified: Secondary | ICD-10-CM | POA: Diagnosis not present

## 2017-08-19 MED ORDER — DOXYCYCLINE HYCLATE 100 MG PO TABS: ORAL_TABLET | ORAL | 0 refills | Status: DC

## 2017-08-19 MED ORDER — PREDNISONE 10 MG PO TABS: ORAL_TABLET | ORAL | 0 refills | Status: DC

## 2017-08-19 MED ORDER — HYDROXYZINE HCL 10 MG PO TABS: ORAL_TABLET | ORAL | 0 refills | Status: DC

## 2017-08-19 MED FILL — DOXYCYCLINE HYCLATE 100 MG: 100 | 7 days supply | Qty: 14 | Fill #0

## 2017-08-19 MED FILL — hydrOXYzine HCL 10 MG TABS: 10 | 5 days supply | Qty: 30 | Fill #0

## 2017-08-19 MED FILL — predniSONE 10 MG TABS: 10 | 4 days supply | Qty: 10 | Fill #0

## 2017-08-19 NOTE — Patient Instructions (Signed)
You do have moderate to severe area of rash about 48 hours post sting. Concern for allergic reaction with secondary skin infection.  I am prescribing prednisone taper dose x 4 days, hydroxyzine for itch and doxycycline antibiotic.  Rx advisement on doxycycline. If area of redness expands or worsens as described then ED evaluation advised.  Follow up 2 days or as needed

## 2017-08-19 NOTE — Progress Notes (Signed)
**Note Maria-Identified via Obfuscation** Subjective:    Patient ID: Maria Lucas, female    DOB: Jun 26, 1960, 57 y.o.   MRN: [id number]  HPI  Pt in for left arm yellow jacket sting. It initially small red about inch wide. Then last 2 days has had redness expand. Area still itches a little bit. Mild irritated sensation.  No fever, chills or sweats.  No sob or wheezing.   No history of anaphylactic reaction in past.  Pt has applied cortisone topically and benadryl.    Review of Systems  Constitutional: Negative for chills, fatigue and fever.  Respiratory: Negative for cough, chest tightness, shortness of breath and wheezing.   Cardiovascular: Negative for chest pain and palpitations.  Gastrointestinal: Negative for abdominal pain.  Musculoskeletal: Negative for back pain.  Skin: Positive for rash.  Neurological: Negative for dizziness and headaches.  Hematological: Negative for adenopathy. Does not bruise/bleed easily.  Psychiatric/Behavioral: Negative for behavioral problems and confusion.   Past Medical History:  Diagnosis Date  . Allergic state 04/12/2011  . Anemia    menorraghia  . Breast mass, left 03/21/2011  . Chest pain 03/12/2011  . Chicken pox as a child  . Elevated antinuclear antibody (ANA) level 03/12/2011  . Elevated BP 03/12/2011  . Glaucoma 03/12/2011  . Heart murmur   . History of UTI   . Hyperglycemia 03/12/2011  . Hyperlipidemia 03/12/2011  . Palpitations   . Perimenopausal 10/05/2012  . Preventative health care 10/12/2013  . Refusal of blood transfusions as patient is Jehovah's Witness 10/12/2014  . Screening for cervical cancer 03/21/2011  . SOM (serous otitis media) 04/12/2011  . Tachycardia 03/21/2011     Social History   Socioeconomic History  . Marital status: Married    Spouse name: Not on file  . Number of children: Not on file  . Years of education: Not on file  . Highest education level: Not on file  Occupational History  . Not on file  Social Needs  . Financial resource strain: Not on  file  . Food insecurity:    Worry: Not on file    Inability: Not on file  . Transportation needs:    Medical: Not on file    Non-medical: Not on file  Tobacco Use  . Smoking status: Never Smoker  . Smokeless tobacco: Never Used  Substance and Sexual Activity  . Alcohol use: No  . Drug use: No  . Sexual activity: Yes    Partners: Male  Lifestyle  . Physical activity:    Days per week: Not on file    Minutes per session: Not on file  . Stress: Not on file  Relationships  . Social connections:    Talks on phone: Not on file    Gets together: Not on file    Attends religious service: Not on file    Active member of club or organization: Not on file    Attends meetings of clubs or organizations: Not on file    Relationship status: Not on file  . Intimate partner violence:    Fear of current or ex partner: Not on file    Emotionally abused: Not on file    Physically abused: Not on file    Forced sexual activity: Not on file  Other Topics Concern  . Not on file  Social History Narrative  . Not on file    Past Surgical History:  Procedure Laterality Date  . LACERATION REPAIR     age 40, dog attack, stitches in face,  multipe sites right side of face    Family History  Problem Relation Age of Onset  . Heart attack Father 17  . Hypertension Father   . Hyperlipidemia Father   . Heart disease Father 68       MI  . Varicose Veins Father   . Hypertension Sister   . Ovarian cysts Sister   . Hyperlipidemia Sister   . Heart attack Brother 43  . Hypertension Brother   . Heart disease Brother   . Hyperlipidemia Brother   . Anxiety disorder Daughter   . OCD Daughter   . Heart attack Maternal Grandmother   . Hypertension Maternal Grandmother        valvular heart disease  . Heart disease Maternal Grandmother   . Vascular Disease Maternal Grandmother   . Cancer Maternal Grandfather        skin, bcc  . Glaucoma Maternal Grandfather   . Stroke Paternal Grandfather   .  Cancer Mother        skin, bcc, scc  . Heart disease Mother        arrythmia  . Hyperlipidemia Mother   . Melanoma Mother   . Colon cancer Neg Hx     Allergies  Allergen Reactions  . Penicillins Rash  . Clindamycin Hcl Rash    Current Outpatient Medications on File Prior to Visit  Medication Sig Dispense Refill  . aspirin 81 MG tablet Take 81 mg by mouth daily.    . Calcium-Magnesium-Vitamin D (CITRACAL CALCIUM+D PO) Take by mouth daily.    . Coenzyme Q10-Red Yeast Rice (CO Q-10 PLUS RED YEAST RICE) 60-600 MG CAPS Take by mouth.    . Flaxseed Oil OIL Take 1,400 mg by mouth daily.    . IRON, FERROUS GLUCONATE, PO Take 65 mg by mouth.    . latanoprost (XALATAN) 0.005 % ophthalmic solution Place 1 drop into both eyes at bedtime.    Marland Kitchen loratadine-pseudoephedrine (CLARITIN-D 24-HOUR) 10-240 MG 24 hr tablet Take 1 tablet by mouth daily.    . multivitamin-iron-minerals-folic acid (CENTRUM) chewable tablet Chew 1 tablet by mouth daily.    . Omega-3 350 MG CPDR Take by mouth.    . Probiotic Product (PROBIOTIC PO) Take 1 tablet by mouth.      No current facility-administered medications on file prior to visit.     BP (!) 141/84   Pulse 80   Temp 98.4 F (36.9 C) (Oral)   Resp 16   Ht 5\' 4"  (1.626 m)   Wt 154 lb 6.4 oz (70 kg)   SpO2 100%   BMI 26.50 kg/m       Objective:   Physical Exam  General- No acute distress. Pleasant patient. Neck- Full range of motion, no jvd Lungs- Clear, even and unlabored. Heart- regular rate and rhythm. Neurologic- CNII- XII grossly intact.    Left upper arm- anterior bicep rash about 8 cm x 6 cm. Left upper area of rash inurated and warm, no fluctuance. But the entire area is pinkish red.  Mild-moderate warmth. No dc, no breakdown, no vesicles. No palpable lymph nodes in upper arm.    Assessment & Plan:  You do have moderate to severe area of rash about 48 hours post sting. Concern for allergic reaction with secondary skin infection.  I  am prescribing prednisone taper dose x 4 days, hydroxyzine for itch and doxycycline antibiotic.  Rx advisement on doxycycline. If area of redness expands or worsens as described then ED evaluation advised.  Follow up 2 days or as needed  Whole FoodsEdward Darryel Diodato, PA-C

## 2017-08-21 ENCOUNTER — Ambulatory Visit (INDEPENDENT_AMBULATORY_CARE_PROVIDER_SITE_OTHER): Payer: BLUE CROSS/BLUE SHIELD | Admitting: Medical

## 2017-08-21 ENCOUNTER — Encounter: Payer: Self-pay | Admitting: Medical

## 2017-08-21 VITALS — BP 147/79 | HR 80 | Temp 98.2°F | Resp 16 | Ht 64.0 in | Wt 156.0 lb

## 2017-08-21 DIAGNOSIS — T7840XA Allergy, unspecified, initial encounter: Secondary | ICD-10-CM

## 2017-08-21 DIAGNOSIS — L089 Local infection of the skin and subcutaneous tissue, unspecified: Secondary | ICD-10-CM | POA: Diagnosis not present

## 2017-08-21 NOTE — Patient Instructions (Signed)
You describe slow improvement with allergic reaction and secondary skin infection over the first 24 hours after seeing you.  However over the last 24 hours area looks much improved.  I think the medications are working very well for now.  I recommend continuing the doxycycline, prednisone and hydroxyzine.  I think you will see progressive improvement from here on out.  I doubt any worsening signs or symptoms however if you do experience any then please let me know.  You could contact me by my chart or call.  If you do see progressive improvement as expected then follow-up recheck would not be necessary.

## 2017-08-21 NOTE — Progress Notes (Signed)
**Note Maria-Identified via Obfuscation** Subjective:    Patient ID: Maria Lucas, female    DOB: 10/26/1960, 57 y.o.   MRN: [id number]  HPI  Pt in for follow up.  Her arm looks better today. Much less redness and itching. No fever, no chills or sweats. Pt has started the antibiotic. On prednisone taper and hydroxyzine,  Now states redness is much less. Less induration and warmth also resolved.  Pt states seems to have gotten better over 24 hours. Yesterday afternoon area did not look much improved.   Review of Systems  Constitutional: Negative for chills, fatigue and fever.  Respiratory: Negative for cough, chest tightness, shortness of breath and wheezing.   Cardiovascular: Negative for chest pain and palpitations.  Musculoskeletal: Negative for back pain and neck pain.  Skin: Positive for rash.       See hpi. Much improved.  Neurological: Negative for dizziness and headaches.  Hematological: Negative for adenopathy. Does not bruise/bleed easily.  Psychiatric/Behavioral: Negative for behavioral problems, confusion and sleep disturbance. The patient is not nervous/anxious.    Past Medical History:  Diagnosis Date  . Allergic state 04/12/2011  . Anemia    menorraghia  . Breast mass, left 03/21/2011  . Chest pain 03/12/2011  . Chicken pox as a child  . Elevated antinuclear antibody (ANA) level 03/12/2011  . Elevated BP 03/12/2011  . Glaucoma 03/12/2011  . Heart murmur   . History of UTI   . Hyperglycemia 03/12/2011  . Hyperlipidemia 03/12/2011  . Palpitations   . Perimenopausal 10/05/2012  . Preventative health care 10/12/2013  . Refusal of blood transfusions as patient is Jehovah's Witness 10/12/2014  . Screening for cervical cancer 03/21/2011  . SOM (serous otitis media) 04/12/2011  . Tachycardia 03/21/2011     Social History   Socioeconomic History  . Marital status: Married    Spouse name: Not on file  . Number of children: Not on file  . Years of education: Not on file  . Highest education level: Not on file    Occupational History  . Not on file  Social Needs  . Financial resource strain: Not on file  . Food insecurity:    Worry: Not on file    Inability: Not on file  . Transportation needs:    Medical: Not on file    Non-medical: Not on file  Tobacco Use  . Smoking status: Never Smoker  . Smokeless tobacco: Never Used  Substance and Sexual Activity  . Alcohol use: No  . Drug use: No  . Sexual activity: Yes    Partners: Male  Lifestyle  . Physical activity:    Days per week: Not on file    Minutes per session: Not on file  . Stress: Not on file  Relationships  . Social connections:    Talks on phone: Not on file    Gets together: Not on file    Attends religious service: Not on file    Active member of club or organization: Not on file    Attends meetings of clubs or organizations: Not on file    Relationship status: Not on file  . Intimate partner violence:    Fear of current or ex partner: Not on file    Emotionally abused: Not on file    Physically abused: Not on file    Forced sexual activity: Not on file  Other Topics Concern  . Not on file  Social History Narrative  . Not on file    Past Surgical History:  Procedure Laterality Date  . LACERATION REPAIR     age 89, dog attack, stitches in face, multipe sites right side of face    Family History  Problem Relation Age of Onset  . Heart attack Father 69  . Hypertension Father   . Hyperlipidemia Father   . Heart disease Father 58       MI  . Varicose Veins Father   . Hypertension Sister   . Ovarian cysts Sister   . Hyperlipidemia Sister   . Heart attack Brother 43  . Hypertension Brother   . Heart disease Brother   . Hyperlipidemia Brother   . Anxiety disorder Daughter   . OCD Daughter   . Heart attack Maternal Grandmother   . Hypertension Maternal Grandmother        valvular heart disease  . Heart disease Maternal Grandmother   . Vascular Disease Maternal Grandmother   . Cancer Maternal Grandfather         skin, bcc  . Glaucoma Maternal Grandfather   . Stroke Paternal Grandfather   . Cancer Mother        skin, bcc, scc  . Heart disease Mother        arrythmia  . Hyperlipidemia Mother   . Melanoma Mother   . Colon cancer Neg Hx     Allergies  Allergen Reactions  . Penicillins Rash  . Clindamycin Hcl Rash    Current Outpatient Medications on File Prior to Visit  Medication Sig Dispense Refill  . aspirin 81 MG tablet Take 81 mg by mouth daily.    . Calcium-Magnesium-Vitamin D (CITRACAL CALCIUM+D PO) Take by mouth daily.    . Coenzyme Q10-Red Yeast Rice (CO Q-10 PLUS RED YEAST RICE) 60-600 MG CAPS Take by mouth.    . doxycycline (VIBRA-TABS) 100 MG tablet 1 tab po bid x 7 days 14 tablet 0  . Flaxseed Oil OIL Take 1,400 mg by mouth daily.    . hydrOXYzine (ATARAX/VISTARIL) 10 MG tablet 1-2 tab po tid prn itching 30 tablet 0  . IRON, FERROUS GLUCONATE, PO Take 65 mg by mouth.    . latanoprost (XALATAN) 0.005 % ophthalmic solution Place 1 drop into both eyes at bedtime.    Marland Kitchen loratadine-pseudoephedrine (CLARITIN-D 24-HOUR) 10-240 MG 24 hr tablet Take 1 tablet by mouth daily.    . multivitamin-iron-minerals-folic acid (CENTRUM) chewable tablet Chew 1 tablet by mouth daily.    . Omega-3 350 MG CPDR Take by mouth.    . predniSONE (DELTASONE) 10 MG tablet 4 tab po day 1, 3 tab po day 2, 2 tab po day 3, 1 tab po day 4 10 tablet 0  . Probiotic Product (PROBIOTIC PO) Take 1 tablet by mouth.      No current facility-administered medications on file prior to visit.     BP (!) 147/79   Pulse 80   Temp 98.2 F (36.8 C) (Oral)   Resp 16   Ht 5\' 4"  (1.626 m)   Wt 156 lb (70.8 kg)   SpO2 100%   BMI 26.78 kg/m       Objective:   Physical Exam   General- No acute distress. Pleasant patient. Neck- Full range of motion, no jvd Lungs- Clear, even and unlabored. Heart- regular rate and rhythm. Neurologic- CNII- XII grossly intact.    Left upper arm- anterior bicep rash  about 8 cm x 6 cm(borders are not that prominent)(I thinks skin coloring almost back to normal). Left upper area of rash  not indurated and  No longer warm, no fluctuance.  No warmth. No dc, no breakdown, no vesicles. No palpable lymph nodes in upper arm.      Assessment & Plan:  You describe slow improvement with allergic reaction and secondary skin infection over the first 24 hours after seeing you.  However over the last 24 hours area looks much improved.  I think the medications are working very well for now.  I recommend continuing the doxycycline, prednisone and hydroxyzine.  I think you will see progressive improvement from here on out.  I doubt any worsening signs or symptoms however if you do experience any then please let me know.  You could contact me by my chart or call.  If you do see progressive improvement as expected then follow-up recheck would not be necessary.  Esperanza Richters, PA-C

## 2017-10-29 ENCOUNTER — Encounter: Payer: Self-pay | Admitting: Family Medicine

## 2017-10-29 ENCOUNTER — Ambulatory Visit (INDEPENDENT_AMBULATORY_CARE_PROVIDER_SITE_OTHER): Payer: BLUE CROSS/BLUE SHIELD | Admitting: Family Medicine

## 2017-10-29 VITALS — BP 122/70 | HR 82 | Temp 98.3°F | Resp 18 | Ht 64.0 in | Wt 154.6 lb

## 2017-10-29 DIAGNOSIS — E785 Hyperlipidemia, unspecified: Secondary | ICD-10-CM

## 2017-10-29 DIAGNOSIS — R739 Hyperglycemia, unspecified: Secondary | ICD-10-CM

## 2017-10-29 DIAGNOSIS — K219 Gastro-esophageal reflux disease without esophagitis: Secondary | ICD-10-CM

## 2017-10-29 DIAGNOSIS — Z0000 Encounter for general adult medical examination without abnormal findings: Secondary | ICD-10-CM

## 2017-10-29 HISTORY — DX: Gastro-esophageal reflux disease without esophagitis: K21.9

## 2017-10-29 LAB — COMPREHENSIVE METABOLIC PANEL
ALBUMIN: 4.7 g/dL (ref 3.5–5.2)
ALT: 14 U/L (ref 0–35)
AST: 20 U/L (ref 0–37)
Alkaline Phosphatase: 84 U/L (ref 39–117)
BUN: 17 mg/dL (ref 6–23)
CALCIUM: 10 mg/dL (ref 8.4–10.5)
CHLORIDE: 104 meq/L (ref 96–112)
CO2: 22 mEq/L (ref 19–32)
CREATININE: 0.86 mg/dL (ref 0.40–1.20)
GFR: 72.41 mL/min (ref >60.00)
Glucose, Bld: 90 mg/dL (ref 70–99)
Potassium: 4.2 mEq/L (ref 3.5–5.1)
SODIUM: 140 meq/L (ref 135–145)
Total Bilirubin: 0.6 mg/dL (ref 0.2–1.2)
Total Protein: 7.5 g/dL (ref 6.0–8.3)

## 2017-10-29 LAB — LIPID PANEL
Cholesterol: 237 mg/dL — ABNORMAL HIGH (ref 0–200)
HDL: 79.9 mg/dL (ref >39.00)
LDL CALC: 131 mg/dL — AB (ref 0–99)
NONHDL: 157.57
Total CHOL/HDL Ratio: 3
Triglycerides: 135 mg/dL (ref 0.0–149.0)
VLDL: 27 mg/dL (ref 0.0–40.0)

## 2017-10-29 LAB — CBC
HCT: 42.4 % (ref 36.0–46.0)
Hemoglobin: 14.1 g/dL (ref 12.0–15.0)
MCHC: 33.2 g/dL (ref 30.0–36.0)
MCV: 94.3 fl (ref 78.0–100.0)
PLATELETS: 201 10*3/uL (ref 150.0–400.0)
RBC: 4.49 Mil/uL (ref 3.87–5.11)
RDW: 12.7 % (ref 11.5–15.5)
WBC: 5.6 10*3/uL (ref 4.0–10.5)

## 2017-10-29 LAB — TSH: TSH: 1.3 u[IU]/mL (ref 0.35–4.50)

## 2017-10-29 LAB — HEMOGLOBIN A1C: Hgb A1c MFr Bld: 5.8 % (ref 4.6–6.5)

## 2017-10-29 MED ORDER — VITAMIN D 50 MCG (2000 UT) PO CAPS: 2000.0000 [IU] | ORAL_CAPSULE | Freq: Every day | ORAL | Status: DC

## 2017-10-29 NOTE — Patient Instructions (Addendum)
Metamucil or Benefiber mix in beverage once or twice daily Probiotics NOW company probiotic, [email or web address] Clear fluids daily 60-80  Exercise   Shingrix is the new shingles shot 2 shots over 2-6 months, check insurance for coverage then call for nurse appt.   Preventive Care 40-64 Years, Female Preventive care refers to lifestyle choices and visits with your health care provider that can promote health and wellness. What does preventive care include?  A yearly physical exam. This is also called an annual well check.  Dental exams once or twice a year.  Routine eye exams. Ask your health care provider how often you should have your eyes checked.  Personal lifestyle choices, including: ? Daily care of your teeth and gums. ? Regular physical activity. ? Eating a healthy diet. ? Avoiding tobacco and drug use. ? Limiting alcohol use. ? Practicing safe sex. ? Taking low-dose aspirin daily starting at age 109. ? Taking vitamin and mineral supplements as recommended by your health care provider. What happens during an annual well check? The services and screenings done by your health care provider during your annual well check will depend on your age, overall health, lifestyle risk factors, and family history of disease. Counseling Your health care provider may ask you questions about your:  Alcohol use.  Tobacco use.  Drug use.  Emotional well-being.  Home and relationship well-being.  Sexual activity.  Eating habits.  Work and work Statistician.  Method of birth control.  Menstrual cycle.  Pregnancy history.  Screening You may have the following tests or measurements:  Height, weight, and BMI.  Blood pressure.  Lipid and cholesterol levels. These may be checked every 5 years, or more frequently if you are over 35 years old.  Skin check.  Lung cancer screening. You may have this screening every year starting at age 22 if you have a 30-pack-year history of  smoking and currently smoke or have quit within the past 15 years.  Fecal occult blood test (FOBT) of the stool. You may have this test every year starting at age 43.  Flexible sigmoidoscopy or colonoscopy. You may have a sigmoidoscopy every 5 years or a colonoscopy every 10 years starting at age 21.  Hepatitis C blood test.  Hepatitis B blood test.  Sexually transmitted disease (STD) testing.  Diabetes screening. This is done by checking your blood sugar (glucose) after you have not eaten for a while (fasting). You may have this done every 1-3 years.  Mammogram. This may be done every 1-2 years. Talk to your health care provider about when you should start having regular mammograms. This may depend on whether you have a family history of breast cancer.  BRCA-related cancer screening. This may be done if you have a family history of breast, ovarian, tubal, or peritoneal cancers.  Pelvic exam and Pap test. This may be done every 3 years starting at age 54. Starting at age 4, this may be done every 5 years if you have a Pap test in combination with an HPV test.  Bone density scan. This is done to screen for osteoporosis. You may have this scan if you are at high risk for osteoporosis.  Discuss your test results, treatment options, and if necessary, the need for more tests with your health care provider. Vaccines Your health care provider may recommend certain vaccines, such as:  Influenza vaccine. This is recommended every year.  Tetanus, diphtheria, and acellular pertussis (Tdap, Td) vaccine. You may need a Td booster every 10  years.  Varicella vaccine. You may need this if you have not been vaccinated.  Zoster vaccine. You may need this after age 15.  Measles, mumps, and rubella (MMR) vaccine. You may need at least one dose of MMR if you were born in 1957 or later. You may also need a second dose.  Pneumococcal 13-valent conjugate (PCV13) vaccine. You may need this if you have  certain conditions and were not previously vaccinated.  Pneumococcal polysaccharide (PPSV23) vaccine. You may need one or two doses if you smoke cigarettes or if you have certain conditions.  Meningococcal vaccine. You may need this if you have certain conditions.  Hepatitis A vaccine. You may need this if you have certain conditions or if you travel or work in places where you may be exposed to hepatitis A.  Hepatitis B vaccine. You may need this if you have certain conditions or if you travel or work in places where you may be exposed to hepatitis B.  Haemophilus influenzae type b (Hib) vaccine. You may need this if you have certain conditions.  Talk to your health care provider about which screenings and vaccines you need and how often you need them. This information is not intended to replace advice given to you by your health care provider. Make sure you discuss any questions you have with your health care provider. Document Released: 02/18/2015 Document Revised: 10/12/2015 Document Reviewed: 11/23/2014 Elsevier Interactive Patient Education  [employer].

## 2017-10-29 NOTE — Assessment & Plan Note (Addendum)
Patient encouraged to maintain heart healthy diet, regular exercise, adequate sleep. Consider daily probiotics. Take medications as prescribed. Given and reviewed copy of ACP documents from [place] Secretary of State and encouraged to complete and return 

## 2017-10-29 NOTE — Assessment & Plan Note (Addendum)
Encouraged heart healthy diet, increase exercise, avoid trans fats, consider a krill oil cap daily. Atorvastatin 10 mg po qhs.

## 2017-10-29 NOTE — Assessment & Plan Note (Addendum)
Avoid offending foods, start probiotics. Do not eat large meals in late evening and consider raising head of bed. Encouraged to add fiber, probiotics, exercise and fluids

## 2017-10-29 NOTE — Assessment & Plan Note (Signed)
hgba1c acceptable, minimize simple carbs. Increase exercise as tolerated.  

## 2017-10-29 NOTE — Progress Notes (Signed)
Subjective:    Patient ID: Maria Lucas, female    DOB: 12-01-1960, 57 y.o.   MRN: [id number]  No chief complaint on file.   HPI Patient is in today for annual preventative exam and follow up on chronic medical concerns. Such as Reflux, hyperlipidemia and hyperglycemia. She feels well today and offers no acute concerns. No recent febrile illness or hospitalizations. No polyuria or polydipsia. She ties to stay active and maintain a heart healthy diet. She has had some mild trouble with heartburn and constipation intermrittently. No bloody or tarry stool. Denies CP/palp/SOB/HA/congestion/fevers or GU c/o. Taking meds as prescribed   Past Medical History:  Diagnosis Date  . Allergic state 04/12/2011  . Anemia    menorraghia  . Breast mass, left 03/21/2011  . Chest pain 03/12/2011  . Chicken pox as a child  . Elevated antinuclear antibody (ANA) level 03/12/2011  . Elevated BP 03/12/2011  . Glaucoma 03/12/2011  . Heart murmur   . History of UTI   . Hyperglycemia 03/12/2011  . Hyperlipidemia 03/12/2011  . Palpitations   . Perimenopausal 10/05/2012  . Preventative health care 10/12/2013  . Refusal of blood transfusions as patient is Jehovah's Witness 10/12/2014  . Screening for cervical cancer 03/21/2011  . SOM (serous otitis media) 04/12/2011  . Tachycardia 03/21/2011    Past Surgical History:  Procedure Laterality Date  . LACERATION REPAIR     age 56, dog attack, stitches in face, multipe sites right side of face    Family History  Problem Relation Age of Onset  . Heart attack Father 62  . Hypertension Father   . Hyperlipidemia Father   . Heart disease Father 34       MI  . Varicose Veins Father   . Hypertension Sister   . Ovarian cysts Sister   . Hyperlipidemia Sister   . Heart attack Brother 35  . Hypertension Brother   . Heart disease Brother   . Hyperlipidemia Brother   . Anxiety disorder Daughter   . OCD Daughter   . Heart attack Maternal Grandmother   . Hypertension Maternal  Grandmother        valvular heart disease  . Heart disease Maternal Grandmother   . Vascular Disease Maternal Grandmother   . Cancer Maternal Grandfather        skin, bcc  . Glaucoma Maternal Grandfather   . Stroke Paternal Grandfather   . Cancer Mother        skin, bcc, scc  . Heart disease Mother        arrythmia  . Hyperlipidemia Mother   . Melanoma Mother   . Osteoporosis Mother   . Colon cancer Neg Hx     Social History   Socioeconomic History  . Marital status: Married    Spouse name: Not on file  . Number of children: Not on file  . Years of education: Not on file  . Highest education level: Not on file  Occupational History  . Not on file  Social Needs  . Financial resource strain: Not on file  . Food insecurity:    Worry: Not on file    Inability: Not on file  . Transportation needs:    Medical: Not on file    Non-medical: Not on file  Tobacco Use  . Smoking status: Never Smoker  . Smokeless tobacco: Never Used  Substance and Sexual Activity  . Alcohol use: No  . Drug use: No  . Sexual activity: Yes  Partners: Male  Lifestyle  . Physical activity:    Days per week: Not on file    Minutes per session: Not on file  . Stress: Not on file  Relationships  . Social connections:    Talks on phone: Not on file    Gets together: Not on file    Attends religious service: Not on file    Active member of club or organization: Not on file    Attends meetings of clubs or organizations: Not on file    Relationship status: Not on file  . Intimate partner violence:    Fear of current or ex partner: Not on file    Emotionally abused: Not on file    Physically abused: Not on file    Forced sexual activity: Not on file  Other Topics Concern  . Not on file  Social History Narrative  . Not on file    Outpatient Medications Prior to Visit  Medication Sig Dispense Refill  . aspirin 81 MG tablet Take 81 mg by mouth daily.    . Calcium-Magnesium-Vitamin D  (CITRACAL CALCIUM+D PO) Take by mouth daily.    . Flaxseed Oil OIL Take 1,400 mg by mouth daily.    . IRON, FERROUS GLUCONATE, PO Take 65 mg by mouth.    . latanoprost (XALATAN) 0.005 % ophthalmic solution Place 1 drop into both eyes at bedtime.    Marland Kitchen loratadine-pseudoephedrine (CLARITIN-D 24-HOUR) 10-240 MG 24 hr tablet Take 1 tablet by mouth daily.    . multivitamin-iron-minerals-folic acid (CENTRUM) chewable tablet Chew 1 tablet by mouth daily.    . Omega-3 350 MG CPDR Take by mouth.    . Probiotic Product (PROBIOTIC DAILY PO) Take 2 each by mouth.    . Coenzyme Q10-Red Yeast Rice (CO Q-10 PLUS RED YEAST RICE) 60-600 MG CAPS Take by mouth.    . Probiotic Product (PROBIOTIC PO) Take 1 tablet by mouth.     . doxycycline (VIBRA-TABS) 100 MG tablet 1 tab po bid x 7 days 14 tablet 0  . hydrOXYzine (ATARAX/VISTARIL) 10 MG tablet 1-2 tab po tid prn itching 30 tablet 0  . predniSONE (DELTASONE) 10 MG tablet 4 tab po day 1, 3 tab po day 2, 2 tab po day 3, 1 tab po day 4 10 tablet 0   No facility-administered medications prior to visit.     Allergies  Allergen Reactions  . Penicillins Rash  . Clindamycin Hcl Rash    Review of Systems  Constitutional: Negative for fever and malaise/fatigue.  HENT: Negative for congestion.   Eyes: Negative for blurred vision.  Respiratory: Negative for shortness of breath.   Cardiovascular: Negative for chest pain, palpitations and leg swelling.  Gastrointestinal: Positive for constipation and heartburn. Negative for abdominal pain, blood in stool, melena and nausea.  Genitourinary: Negative for dysuria and frequency.  Musculoskeletal: Negative for falls.  Skin: Negative for rash.  Neurological: Negative for dizziness, loss of consciousness and headaches.  Endo/Heme/Allergies: Negative for environmental allergies.  Psychiatric/Behavioral: Negative for depression. The patient is not nervous/anxious.        Objective:    Physical Exam  Constitutional:  She is oriented to person, place, and time. She appears well-developed and well-nourished. No distress.  HENT:  Head: Normocephalic and atraumatic.  Eyes: Conjunctivae are normal.  Neck: Neck supple. No thyromegaly present.  Cardiovascular: Normal rate, regular rhythm and normal heart sounds.  No murmur heard. Pulmonary/Chest: Effort normal and breath sounds normal. No respiratory distress.  Abdominal:  Soft. Bowel sounds are normal. She exhibits no distension and no mass. There is no tenderness.  Musculoskeletal: She exhibits no edema.  Lymphadenopathy:    She has no cervical adenopathy.  Neurological: She is alert and oriented to person, place, and time.  Skin: Skin is warm and dry.  Psychiatric: She has a normal mood and affect. Her behavior is normal.    BP 122/70 (BP Location: Left Arm, Patient Position: Sitting, Cuff Size: Normal)   Pulse 82   Temp 98.3 F (36.8 C) (Oral)   Resp 18   Ht 5' 4"  (1.626 m)   Wt 154 lb 9.6 oz (70.1 kg)   SpO2 98%   BMI 26.54 kg/m  Wt Readings from Last 3 Encounters:  10/29/17 154 lb 9.6 oz (70.1 kg)  08/21/17 156 lb (70.8 kg)  08/19/17 154 lb 6.4 oz (70 kg)     Lab Results  Component Value Date   WBC 5.6 10/29/2017   HGB 14.1 10/29/2017   HCT 42.4 10/29/2017   PLT 201.0 10/29/2017   GLUCOSE 90 10/29/2017   CHOL 237 (H) 10/29/2017   TRIG 135.0 10/29/2017   HDL 79.90 10/29/2017   LDLDIRECT 122.7 03/26/2012   LDLCALC 131 (H) 10/29/2017   ALT 14 10/29/2017   AST 20 10/29/2017   NA 140 10/29/2017   K 4.2 10/29/2017   CL 104 10/29/2017   CREATININE 0.86 10/29/2017   BUN 17 10/29/2017   CO2 22 10/29/2017   TSH 1.30 10/29/2017   HGBA1C 5.8 10/29/2017    Lab Results  Component Value Date   TSH 1.30 10/29/2017   Lab Results  Component Value Date   WBC 5.6 10/29/2017   HGB 14.1 10/29/2017   HCT 42.4 10/29/2017   MCV 94.3 10/29/2017   PLT 201.0 10/29/2017   Lab Results  Component Value Date   NA 140 10/29/2017   K 4.2  10/29/2017   CO2 22 10/29/2017   GLUCOSE 90 10/29/2017   BUN 17 10/29/2017   CREATININE 0.86 10/29/2017   BILITOT 0.6 10/29/2017   ALKPHOS 84 10/29/2017   AST 20 10/29/2017   ALT 14 10/29/2017   PROT 7.5 10/29/2017   ALBUMIN 4.7 10/29/2017   CALCIUM 10.0 10/29/2017   GFR 72.41 10/29/2017   Lab Results  Component Value Date   CHOL 237 (H) 10/29/2017   Lab Results  Component Value Date   HDL 79.90 10/29/2017   Lab Results  Component Value Date   LDLCALC 131 (H) 10/29/2017   Lab Results  Component Value Date   TRIG 135.0 10/29/2017   Lab Results  Component Value Date   CHOLHDL 3 10/29/2017   Lab Results  Component Value Date   HGBA1C 5.8 10/29/2017       Assessment & Plan:   Problem List Items Addressed This Visit    Hyperlipidemia    Encouraged heart healthy diet, increase exercise, avoid trans fats, consider a krill oil cap daily. Atorvastatin 10 mg po qhs.       Relevant Medications   atorvastatin (LIPITOR) 10 MG tablet   Other Relevant Orders   Lipid panel (Completed)   TSH (Completed)   Comp Met (CMET)   Lipid panel   Hyperglycemia    hgba1c acceptable, minimize simple carbs. Increase exercise as tolerated.       Relevant Orders   Hemoglobin A1c (Completed)   TSH (Completed)   Preventative health care    Patient encouraged to maintain heart healthy diet, regular exercise, adequate sleep.  Consider daily probiotics. Take medications as prescribed. Given and reviewed copy of ACP documents from [place] and encouraged to complete and return      Relevant Orders   TSH (Completed)   Acid reflux    Avoid offending foods, start probiotics. Do not eat large meals in late evening and consider raising head of bed. Encouraged to add fiber, probiotics, exercise and fluids      Relevant Medications   Probiotic Product (PROBIOTIC DAILY PO)   Other Relevant Orders   CBC (Completed)   Comprehensive metabolic panel (Completed)   TSH (Completed)       I have discontinued Maria Lucas's Probiotic Product (PROBIOTIC PO), Coenzyme Q10-Red Yeast Rice, predniSONE, hydrOXYzine, and doxycycline. I am also having her start on Vitamin D and atorvastatin. Additionally, I am having her maintain her Flaxseed Oil, multivitamin-iron-minerals-folic acid, Calcium-Magnesium-Vitamin D (CITRACAL CALCIUM+D PO), latanoprost, aspirin, Omega-3, loratadine-pseudoephedrine, (IRON, FERROUS GLUCONATE, PO), and Probiotic Product (PROBIOTIC DAILY PO).  Meds ordered this encounter  Medications  . Cholecalciferol (VITAMIN D) 2000 units CAPS    Sig: Take 1 capsule (2,000 Units total) by mouth daily.    Dispense:  30 capsule  . atorvastatin (LIPITOR) 10 MG tablet    Sig: Take 1 tablet (10 mg total) by mouth at bedtime.    Dispense:  30 tablet    Refill:  3     Penni Homans, MD

## 2017-10-30 MED ORDER — ATORVASTATIN CALCIUM 10 MG PO TABS: 10.0000 mg | ORAL_TABLET | Freq: Every day | ORAL | 3 refills | Status: DC

## 2017-10-30 MED FILL — ATORVASTATIN 10 MG TABLET: 10 | 30 days supply | Qty: 30 | Fill #0

## 2017-12-02 MED FILL — ATORVASTATIN 10 MG TABLET: 10 | 30 days supply | Qty: 30 | Fill #1

## 2017-12-30 MED FILL — ATORVASTATIN 10 MG TABLET: 10 | 30 days supply | Qty: 30 | Fill #2

## 2018-01-27 ENCOUNTER — Other Ambulatory Visit (INDEPENDENT_AMBULATORY_CARE_PROVIDER_SITE_OTHER): Payer: BLUE CROSS/BLUE SHIELD

## 2018-01-27 DIAGNOSIS — E785 Hyperlipidemia, unspecified: Secondary | ICD-10-CM | POA: Diagnosis not present

## 2018-01-27 LAB — LIPID PANEL
CHOLESTEROL: 188 mg/dL (ref 0–200)
HDL: 88.7 mg/dL (ref >39.00)
LDL Cholesterol: 80 mg/dL (ref 0–99)
NonHDL: 99.69
TRIGLYCERIDES: 98 mg/dL (ref 0.0–149.0)
Total CHOL/HDL Ratio: 2
VLDL: 19.6 mg/dL (ref 0.0–40.0)

## 2018-01-27 LAB — COMPREHENSIVE METABOLIC PANEL
ALBUMIN: 4.5 g/dL (ref 3.5–5.2)
ALT: 18 U/L (ref 0–35)
AST: 21 U/L (ref 0–37)
Alkaline Phosphatase: 88 U/L (ref 39–117)
BILIRUBIN TOTAL: 0.5 mg/dL (ref 0.2–1.2)
BUN: 14 mg/dL (ref 6–23)
CALCIUM: 10.1 mg/dL (ref 8.4–10.5)
CO2: 33 mEq/L — ABNORMAL HIGH (ref 19–32)
CREATININE: 0.93 mg/dL (ref 0.40–1.20)
Chloride: 101 mEq/L (ref 96–112)
GFR: 66.09 mL/min (ref >60.00)
Glucose, Bld: 104 mg/dL — ABNORMAL HIGH (ref 70–99)
Potassium: 4.4 mEq/L (ref 3.5–5.1)
SODIUM: 142 meq/L (ref 135–145)
Total Protein: 7 g/dL (ref 6.0–8.3)

## 2018-01-31 MED FILL — ATORVASTATIN 10 MG TABLET: 10 | 30 days supply | Qty: 30 | Fill #3

## 2018-02-27 ENCOUNTER — Other Ambulatory Visit: Payer: Self-pay | Admitting: Family Medicine

## 2018-02-27 MED FILL — ATORVASTATIN 10 MG TABLET: 10 | 30 days supply | Qty: 30 | Fill #0

## 2018-04-21 MED FILL — ATORVASTATIN 10 MG TABLET: 10 | 30 days supply | Qty: 30 | Fill #1 | Status: TO

## 2018-08-18 DIAGNOSIS — M8588 Other specified disorders of bone density and structure, other site: Secondary | ICD-10-CM

## 2018-08-18 DIAGNOSIS — Z8632 Personal history of gestational diabetes: Secondary | ICD-10-CM

## 2018-08-18 DIAGNOSIS — J3089 Other allergic rhinitis: Secondary | ICD-10-CM

## 2018-08-18 HISTORY — DX: Other allergic rhinitis: J30.89

## 2018-08-18 HISTORY — DX: Other specified disorders of bone density and structure, other site: M85.88

## 2018-08-18 HISTORY — DX: Personal history of gestational diabetes: Z86.32

## 2018-11-10 ENCOUNTER — Encounter: Payer: BLUE CROSS/BLUE SHIELD | Admitting: Family Medicine

## 2019-08-24 ENCOUNTER — Telehealth: Payer: Self-pay | Admitting: Family Medicine

## 2019-08-24 MED ORDER — ATORVASTATIN CALCIUM 10 MG PO TABS: 10.0000 mg | ORAL_TABLET | Freq: Every day | ORAL | 0 refills | Status: DC

## 2019-08-24 NOTE — Telephone Encounter (Signed)
Patient states that she needs medication refill unitl her appointment time on 09/03/2019

## 2019-08-24 NOTE — Telephone Encounter (Signed)
Left message on machine to call back   Need to schedule an appointment last seen 2019  Also need to clarify meds to be refilled.  The citracal is otc

## 2019-08-24 NOTE — Telephone Encounter (Signed)
Pt requesting refill on atorvastatin (LIPITOR) 10 MG tablet -Calcium-Magnesium-Vitamin D (CITRACAL CALCIUM+D PO) sent to [facility] on [address], [place]. Please advise.

## 2019-08-24 NOTE — Telephone Encounter (Signed)
Patient scheduled for 09/03/19 and rx sent in for atorvastatin.

## 2019-09-03 ENCOUNTER — Ambulatory Visit: Payer: 59 | Admitting: Family Medicine

## 2019-09-03 ENCOUNTER — Encounter: Payer: Self-pay | Admitting: Family Medicine

## 2019-09-03 ENCOUNTER — Other Ambulatory Visit: Payer: Self-pay

## 2019-09-03 VITALS — BP 136/80 | HR 83 | Temp 98.0°F | Resp 12 | Ht 64.0 in | Wt 154.2 lb

## 2019-09-03 DIAGNOSIS — R739 Hyperglycemia, unspecified: Secondary | ICD-10-CM

## 2019-09-03 DIAGNOSIS — T7840XD Allergy, unspecified, subsequent encounter: Secondary | ICD-10-CM

## 2019-09-03 DIAGNOSIS — E785 Hyperlipidemia, unspecified: Secondary | ICD-10-CM | POA: Diagnosis not present

## 2019-09-03 DIAGNOSIS — R000 Tachycardia, unspecified: Secondary | ICD-10-CM | POA: Diagnosis not present

## 2019-09-03 LAB — COMPREHENSIVE METABOLIC PANEL
ALT: 16 U/L (ref 0–35)
AST: 22 U/L (ref 0–37)
Albumin: 4.6 g/dL (ref 3.5–5.2)
Alkaline Phosphatase: 80 U/L (ref 39–117)
BUN: 16 mg/dL (ref 6–23)
CO2: 31 mEq/L (ref 19–32)
Calcium: 10 mg/dL (ref 8.4–10.5)
Chloride: 103 mEq/L (ref 96–112)
Creatinine, Ser: 0.93 mg/dL (ref 0.40–1.20)
GFR: 61.84 mL/min (ref >60.00)
Glucose, Bld: 100 mg/dL — ABNORMAL HIGH (ref 70–99)
Potassium: 4 mEq/L (ref 3.5–5.1)
Sodium: 139 mEq/L (ref 135–145)
Total Bilirubin: 0.5 mg/dL (ref 0.2–1.2)
Total Protein: 7.5 g/dL (ref 6.0–8.3)

## 2019-09-03 LAB — LIPID PANEL
Cholesterol: 186 mg/dL (ref 0–200)
HDL: 88.5 mg/dL (ref >39.00)
LDL Cholesterol: 75 mg/dL (ref 0–99)
NonHDL: 97.88
Total CHOL/HDL Ratio: 2
Triglycerides: 115 mg/dL (ref 0.0–149.0)
VLDL: 23 mg/dL (ref 0.0–40.0)

## 2019-09-03 LAB — CBC
HCT: 41.1 % (ref 36.0–46.0)
Hemoglobin: 13.7 g/dL (ref 12.0–15.0)
MCHC: 33.2 g/dL (ref 30.0–36.0)
MCV: 94.6 fl (ref 78.0–100.0)
Platelets: 175 10*3/uL (ref 150.0–400.0)
RBC: 4.35 Mil/uL (ref 3.87–5.11)
RDW: 13.1 % (ref 11.5–15.5)
WBC: 5.4 10*3/uL (ref 4.0–10.5)

## 2019-09-03 LAB — TSH: TSH: 1.98 u[IU]/mL (ref 0.35–4.50)

## 2019-09-03 LAB — HEMOGLOBIN A1C: Hgb A1c MFr Bld: 5.8 % (ref 4.6–6.5)

## 2019-09-03 MED ORDER — TRIAMCINOLONE ACETONIDE 0.1 % EX CREA
1.0000 | TOPICAL_CREAM | Freq: Two times a day (BID) | CUTANEOUS | 0 refills | Status: DC | PRN
Start: 2019-09-03 — End: 2020-02-11

## 2019-09-03 MED ORDER — FLUTICASONE PROPIONATE 50 MCG/ACT NA SUSP
2.0000 | Freq: Every day | NASAL | 6 refills | Status: DC
Start: 2019-09-03 — End: 2021-03-31

## 2019-09-03 NOTE — Assessment & Plan Note (Signed)
hgba1c acceptable, minimize simple carbs. Increase exercise as tolerated.  

## 2019-09-03 NOTE — Assessment & Plan Note (Signed)
Encouraged heart healthy diet, increase exercise, avoid trans fats, consider a krill oil cap daily 

## 2019-09-03 NOTE — Patient Instructions (Signed)
Lamisil OTC twice a day Eczema Eczema is a broad term for a group of skin conditions that cause skin to become rough and inflamed. Each type of eczema has different triggers, symptoms, and treatments. Eczema of any type is usually itchy and symptoms range from mild to severe. Eczema and its symptoms are not spread from person to person (are not contagious). It can appear on different parts of the body at different times. Your eczema may not look the same as someone else's eczema. What are the types of eczema? Atopic dermatitis This is a long-term (chronic) skin disease that keeps coming back (recurring). Usual symptoms are dry skin and small, solid pimples that may swell and leak fluid (weep). Contact dermatitis  This happens when something irritates the skin and causes a rash. The irritation can come from substances that you are allergic to (allergens), such as poison ivy, chemicals, or medicines that were applied to your skin. Dyshidrotic eczema This is a form of eczema on the hands and feet. It shows up as very itchy, fluid-filled blisters. It can affect people of any age, but is more common before age 44. Hand eczema  This causes very itchy areas of skin on the palms and sides of the hands and fingers. This type of eczema is common in industrial jobs where you may be exposed to many different types of irritants. Lichen simplex chronicus This type of eczema occurs when a person constantly scratches one area of the body. Repeated scratching of the area leads to thickened skin (lichenification). Lichen simplex chronicus can occur along with other types of eczema. It is more common in adults, but may be seen in children as well. Nummular eczema This is a common type of eczema. It has no known cause. It typically causes a red, circular, crusty lesion (plaque) that may be itchy. Scratching may become a habit and can cause bleeding. Nummular eczema occurs most often in people of middle-age or older.  It most often affects the hands. Seborrheic dermatitis This is a common skin disease that mainly affects the scalp. It may also affect any oily areas of the body, such as the face, sides of nose, eyebrows, ears, eyelids, and chest. It is marked by small scaling and redness of the skin (erythema). This can affect people of all ages. In infants, this condition is known as Location manager." Stasis dermatitis This is a common skin disease that usually appears on the legs and feet. It most often occurs in people who have a condition that prevents blood from being pumped through the veins in the legs (chronic venous insufficiency). Stasis dermatitis is a chronic condition that needs long-term management. How is eczema diagnosed? Your health care provider will examine your skin and review your medical history. He or she may also give you skin patch tests. These tests involve taking patches that contain possible allergens and placing them on your back. He or she will then check in a few days to see if an allergic reaction occurred. What are the common treatments? Treatment for eczema is based on the type of eczema you have. Hydrocortisone steroid medicine can relieve itching quickly and help reduce inflammation. This medicine may be prescribed or obtained over-the-counter, depending on the strength of the medicine that is needed. Follow these instructions at home:  Take over-the-counter and prescription medicines only as told by your health care provider.  Use creams or ointments to moisturize your skin. Do not use lotions.  Learn what triggers or irritates  your symptoms. Avoid these things.  Treat symptom flare-ups quickly.  Do not itch your skin. This can make your rash worse.  Keep all follow-up visits as told by your health care provider. This is important. Where to find more information  The American Academy of Dermatology: InfoExam.si  The National Eczema Association:  www.nationaleczema.org Contact a health care provider if:  You have serious itching, even with treatment.  You regularly scratch your skin until it bleeds.  Your rash looks different than usual.  Your skin is painful, swollen, or more red than usual.  You have a fever. Summary  There are eight general types of eczema. Each type has different triggers.  Eczema of any type causes itching that may range from mild to severe.  Treatment varies based on the type of eczema you have. Hydrocortisone steroid medicine can help with itching and inflammation.  Protecting your skin is the best way to prevent eczema. Use moisturizers and lotions. Avoid triggers and irritants, and treat flare-ups quickly. This information is not intended to replace advice given to you by your health care provider. Make sure you discuss any questions you have with your health care provider. Document Revised: 01/04/2017 Document Reviewed: 06/07/2016 Elsevier Patient Education  2020 [employer].

## 2019-09-04 NOTE — Assessment & Plan Note (Signed)
Refill given on Flonase. °

## 2019-09-04 NOTE — Progress Notes (Signed)
**Note Maria-Identified via Obfuscation** Subjective:    Patient ID: Maria Lucas, female    DOB: Nov 15, 1960, 59 y.o.   MRN: [id number]  Chief Complaint  Patient presents with  . Follow-up    HPI Patient is in today for follow up on chronic medical concerns. No recent febrile illness or hospitalizations. She had to transfer her care to [facility] due to her insurance coverage, she has now been able to switch her insurance coverage and so she is able to return her care back to our office. She denies any recent acute concerns. She needs some refills. Denies CP/palp/SOB/HA/congestion/fevers/GI or GU c/o. Taking meds as prescribed  Past Medical History:  Diagnosis Date  . Allergic state 04/12/2011  . Anemia    menorraghia  . Breast mass, left 03/21/2011  . Chest pain 03/12/2011  . Chicken pox as a child  . Elevated antinuclear antibody (ANA) level 03/12/2011  . Elevated BP 03/12/2011  . Glaucoma 03/12/2011  . Heart murmur   . History of UTI   . Hyperglycemia 03/12/2011  . Hyperlipidemia 03/12/2011  . Palpitations   . Perimenopausal 10/05/2012  . Preventative health care 10/12/2013  . Refusal of blood transfusions as patient is Jehovah's Witness 10/12/2014  . Screening for cervical cancer 03/21/2011  . SOM (serous otitis media) 04/12/2011  . Tachycardia 03/21/2011    Past Surgical History:  Procedure Laterality Date  . LACERATION REPAIR     age 61, dog attack, stitches in face, multipe sites right side of face    Family History  Problem Relation Age of Onset  . Heart attack Father 43  . Hypertension Father   . Hyperlipidemia Father   . Heart disease Father 70       MI  . Varicose Veins Father   . Hypertension Sister   . Ovarian cysts Sister   . Hyperlipidemia Sister   . Heart attack Brother 43  . Hypertension Brother   . Heart disease Brother   . Hyperlipidemia Brother   . Anxiety disorder Daughter   . OCD Daughter   . Heart attack Maternal Grandmother   . Hypertension Maternal Grandmother        valvular heart disease  . Heart  disease Maternal Grandmother   . Vascular Disease Maternal Grandmother   . Cancer Maternal Grandfather        skin, bcc  . Glaucoma Maternal Grandfather   . Stroke Paternal Grandfather   . Cancer Mother        skin, bcc, scc  . Heart disease Mother        arrythmia  . Hyperlipidemia Mother   . Melanoma Mother   . Osteoporosis Mother   . Colon cancer Neg Hx     Social History   Socioeconomic History  . Marital status: Married    Spouse name: Not on file  . Number of children: Not on file  . Years of education: Not on file  . Highest education level: Not on file  Occupational History  . Not on file  Tobacco Use  . Smoking status: Never Smoker  . Smokeless tobacco: Never Used  Substance and Sexual Activity  . Alcohol use: No  . Drug use: No  . Sexual activity: Yes    Partners: Male  Other Topics Concern  . Not on file  Social History Narrative  . Not on file   Social Determinants of Health   Financial Resource Strain:   . Difficulty of Paying Living Expenses:   Food Insecurity:   .  Worried About Programme researcher, broadcasting/film/video in the Last Year:   . Barista in the Last Year:   Transportation Needs:   . Freight forwarder (Medical):   Marland Kitchen Lack of Transportation (Non-Medical):   Physical Activity:   . Days of Exercise per Week:   . Minutes of Exercise per Session:   Stress:   . Feeling of Stress :   Social Connections:   . Frequency of Communication with Friends and Family:   . Frequency of Social Gatherings with Friends and Family:   . Attends Religious Services:   . Active Member of Clubs or Organizations:   . Attends Banker Meetings:   Marland Kitchen Marital Status:   Intimate Partner Violence:   . Fear of Current or Ex-Partner:   . Emotionally Abused:   Marland Kitchen Physically Abused:   . Sexually Abused:     Outpatient Medications Prior to Visit  Medication Sig Dispense Refill  . aspirin 81 MG tablet Take 81 mg by mouth daily.    Marland Kitchen atorvastatin (LIPITOR) 10  MG tablet Take 1 tablet (10 mg total) by mouth at bedtime. 90 tablet 0  . Calcium-Magnesium-Vitamin D (CITRACAL CALCIUM+D PO) Take by mouth daily.    . Cholecalciferol (VITAMIN D) 2000 units CAPS Take 1 capsule (2,000 Units total) by mouth daily. 30 capsule   . Coenzyme Q10 10 MG capsule Take 1 capsule by mouth daily.    . Flaxseed Oil OIL Take 1,400 mg by mouth daily.    . IRON, FERROUS GLUCONATE, PO Take 65 mg by mouth.    . latanoprost (XALATAN) 0.005 % ophthalmic solution Place 1 drop into both eyes at bedtime.    Marland Kitchen loratadine-pseudoephedrine (CLARITIN-D 24-HOUR) 10-240 MG 24 hr tablet Take 1 tablet by mouth daily.    . multivitamin-iron-minerals-folic acid (CENTRUM) chewable tablet Chew 1 tablet by mouth daily.    . Omega-3 350 MG CPDR Take by mouth.    . Probiotic Product (PROBIOTIC DAILY PO) Take 2 each by mouth.     No facility-administered medications prior to visit.    Allergies  Allergen Reactions  . Penicillins Rash  . Clindamycin Hcl Rash    Review of Systems  Constitutional: Negative for fever and malaise/fatigue.  HENT: Negative for congestion.   Eyes: Negative for blurred vision.  Respiratory: Negative for shortness of breath.   Cardiovascular: Negative for chest pain, palpitations and leg swelling.  Gastrointestinal: Negative for abdominal pain, blood in stool and nausea.  Genitourinary: Negative for dysuria and frequency.  Musculoskeletal: Negative for falls.  Skin: Negative for rash.  Neurological: Negative for dizziness, loss of consciousness and headaches.  Endo/Heme/Allergies: Negative for environmental allergies.  Psychiatric/Behavioral: Negative for depression. The patient is not nervous/anxious.        Objective:    Physical Exam Vitals and nursing note reviewed.  Constitutional:      General: She is not in acute distress.    Appearance: She is well-developed.  HENT:     Head: Normocephalic and atraumatic.     Nose: Nose normal.  Eyes:      General:        Right eye: No discharge.        Left eye: No discharge.  Cardiovascular:     Rate and Rhythm: Normal rate and regular rhythm.     Heart sounds: No murmur heard.   Pulmonary:     Effort: Pulmonary effort is normal.     Breath sounds: Normal breath sounds.  Abdominal:     General: Bowel sounds are normal.     Palpations: Abdomen is soft.     Tenderness: There is no abdominal tenderness.  Musculoskeletal:     Cervical back: Normal range of motion and neck supple.  Skin:    General: Skin is warm and dry.  Neurological:     Mental Status: She is alert and oriented to person, place, and time.     BP (!) 136/80 (BP Location: Right Arm, Cuff Size: Normal)   Pulse 83   Temp 98 F (36.7 C) (Oral)   Resp 12   Ht 5\' 4"  (1.626 m)   Wt 154 lb 3.2 oz (69.9 kg)   SpO2 99%   BMI 26.47 kg/m  Wt Readings from Last 3 Encounters:  09/03/19 154 lb 3.2 oz (69.9 kg)  10/29/17 154 lb 9.6 oz (70.1 kg)  08/21/17 156 lb (70.8 kg)    Diabetic Foot Exam - Simple   No data filed     Lab Results  Component Value Date   WBC 5.4 09/03/2019   HGB 13.7 09/03/2019   HCT 41.1 09/03/2019   PLT 175.0 09/03/2019   GLUCOSE 100 (H) 09/03/2019   CHOL 186 09/03/2019   TRIG 115.0 09/03/2019   HDL 88.50 09/03/2019   LDLDIRECT 122.7 03/26/2012   LDLCALC 75 09/03/2019   ALT 16 09/03/2019   AST 22 09/03/2019   NA 139 09/03/2019   K 4.0 09/03/2019   CL 103 09/03/2019   CREATININE 0.93 09/03/2019   BUN 16 09/03/2019   CO2 31 09/03/2019   TSH 1.98 09/03/2019   HGBA1C 5.8 09/03/2019    Lab Results  Component Value Date   TSH 1.98 09/03/2019   Lab Results  Component Value Date   WBC 5.4 09/03/2019   HGB 13.7 09/03/2019   HCT 41.1 09/03/2019   MCV 94.6 09/03/2019   PLT 175.0 09/03/2019   Lab Results  Component Value Date   NA 139 09/03/2019   K 4.0 09/03/2019   CO2 31 09/03/2019   GLUCOSE 100 (H) 09/03/2019   BUN 16 09/03/2019   CREATININE 0.93 09/03/2019   BILITOT 0.5  09/03/2019   ALKPHOS 80 09/03/2019   AST 22 09/03/2019   ALT 16 09/03/2019   PROT 7.5 09/03/2019   ALBUMIN 4.6 09/03/2019   CALCIUM 10.0 09/03/2019   GFR 61.84 09/03/2019   Lab Results  Component Value Date   CHOL 186 09/03/2019   Lab Results  Component Value Date   HDL 88.50 09/03/2019   Lab Results  Component Value Date   LDLCALC 75 09/03/2019   Lab Results  Component Value Date   TRIG 115.0 09/03/2019   Lab Results  Component Value Date   CHOLHDL 2 09/03/2019   Lab Results  Component Value Date   HGBA1C 5.8 09/03/2019       Assessment & Plan:   Problem List Items Addressed This Visit    Hyperlipidemia    Encouraged heart healthy diet, increase exercise, avoid trans fats, consider a krill oil cap daily      Hyperglycemia    hgba1c acceptable, minimize simple carbs. Increase exercise as tolerated.       Relevant Orders   Comprehensive metabolic panel (Completed)   TSH (Completed)   Hemoglobin A1c (Completed)   Tachycardia - Primary    RRR today      Relevant Orders   CBC (Completed)   Lipid panel (Completed)   TSH (Completed)   Allergy  Refill given on Flonase.          I am having Maria Lucas start on fluticasone and triamcinolone cream. I am also having her maintain her Flaxseed Oil, multivitamin-iron-minerals-folic acid, Calcium-Magnesium-Vitamin D (CITRACAL CALCIUM+D PO), latanoprost, aspirin, Omega-3, loratadine-pseudoephedrine, (IRON, FERROUS GLUCONATE, PO), Probiotic Product (PROBIOTIC DAILY PO), Vitamin D, atorvastatin, and Coenzyme Q10.  Meds ordered this encounter  Medications  . fluticasone (FLONASE) 50 MCG/ACT nasal spray    Sig: Place 2 sprays into both nostrils daily.    Dispense:  16 g    Refill:  6  . triamcinolone cream (KENALOG) 0.1 %    Sig: Apply 1 application topically 2 (two) times daily as needed.    Dispense:  30 g    Refill:  0     Danise Edge, MD

## 2019-09-04 NOTE — Assessment & Plan Note (Signed)
RRR today 

## 2020-02-07 ENCOUNTER — Other Ambulatory Visit: Payer: Self-pay

## 2020-02-07 ENCOUNTER — Ambulatory Visit
Admission: EM | Admit: 2020-02-07 | Discharge: 2020-02-07 | Disposition: A | Discharge status: Home / Self Care | Payer: 59

## 2020-02-07 NOTE — ED Provider Notes (Signed)
Inform patient there is no rad tech onsite.  Unable to perform x-ray in office-patient does not want to proceed with visit, will go to the urgent care for assessment.   Hall[place]Potvin, [place], [place] 02/07/20 [phone]

## 2020-02-07 NOTE — ED Triage Notes (Signed)
Pt c/o left ankle pain. Pt states that while walking in a park yesterday she slipped on wet leaves and heard her left ankle "pop and crack". State she fell with left ankle under her body.  Pt unable to flex/extend left foot or rotate through circle ROM. Mild edema noted lateral aspect.  Toes warm with good cap refill

## 2020-02-08 ENCOUNTER — Telehealth: Payer: Self-pay
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
## 2020-02-10 ENCOUNTER — Other Ambulatory Visit (HOSPITAL_COMMUNITY): Payer: Self-pay | Admitting: Orthopedic Surgery

## 2020-02-10 DIAGNOSIS — S82852A Displaced trimalleolar fracture of left lower leg, initial encounter for closed fracture: Secondary | ICD-10-CM

## 2020-02-10 HISTORY — DX: Displaced trimalleolar fracture of left lower leg, initial encounter for closed fracture: S82.852A

## 2020-02-11 ENCOUNTER — Encounter (HOSPITAL_BASED_OUTPATIENT_CLINIC_OR_DEPARTMENT_OTHER): Payer: Self-pay | Admitting: Orthopedic Surgery

## 2020-02-11 ENCOUNTER — Other Ambulatory Visit: Payer: Self-pay

## 2020-02-13 ENCOUNTER — Other Ambulatory Visit: Payer: Self-pay | Admitting: Family Medicine

## 2020-02-15 ENCOUNTER — Other Ambulatory Visit (HOSPITAL_COMMUNITY): Payer: Self-pay

## 2020-02-16 ENCOUNTER — Other Ambulatory Visit (HOSPITAL_COMMUNITY)
Admission: RE | Admit: 2020-02-16 | Discharge: 2020-02-16 | Disposition: A | Discharge status: Home / Self Care | Payer: 59 | Source: Ambulatory Visit | Attending: Orthopedic Surgery | Admitting: Orthopedic Surgery

## 2020-02-16 DIAGNOSIS — Z01812 Encounter for preprocedural laboratory examination: Secondary | ICD-10-CM | POA: Insufficient documentation

## 2020-02-16 DIAGNOSIS — Z20822 Contact with and (suspected) exposure to covid-19: Secondary | ICD-10-CM | POA: Insufficient documentation

## 2020-02-16 LAB — SARS CORONAVIRUS 2 (TAT 6-24 HRS): SARS Coronavirus 2: NEGATIVE

## 2020-02-16 NOTE — Progress Notes (Signed)

## 2020-02-18 ENCOUNTER — Ambulatory Visit (HOSPITAL_BASED_OUTPATIENT_CLINIC_OR_DEPARTMENT_OTHER): Payer: 59 | Admitting: Anesthesiology

## 2020-02-18 ENCOUNTER — Encounter (HOSPITAL_BASED_OUTPATIENT_CLINIC_OR_DEPARTMENT_OTHER)
Admission: RE | Disposition: A | Discharge status: Home / Self Care | Payer: Self-pay | Source: Home / Self Care | Attending: Orthopedic Surgery

## 2020-02-18 ENCOUNTER — Encounter (HOSPITAL_BASED_OUTPATIENT_CLINIC_OR_DEPARTMENT_OTHER): Payer: Self-pay | Admitting: Orthopedic Surgery

## 2020-02-18 ENCOUNTER — Ambulatory Visit (HOSPITAL_BASED_OUTPATIENT_CLINIC_OR_DEPARTMENT_OTHER)
Admission: RE | Admit: 2020-02-18 | Discharge: 2020-02-18 | Disposition: A | Discharge status: Home / Self Care | Payer: 59 | Attending: Orthopedic Surgery | Admitting: Orthopedic Surgery

## 2020-02-18 ENCOUNTER — Other Ambulatory Visit: Payer: Self-pay

## 2020-02-18 DIAGNOSIS — S82852A Displaced trimalleolar fracture of left lower leg, initial encounter for closed fracture: Secondary | ICD-10-CM | POA: Diagnosis not present

## 2020-02-18 DIAGNOSIS — Y9301 Activity, walking, marching and hiking: Secondary | ICD-10-CM | POA: Insufficient documentation

## 2020-02-18 DIAGNOSIS — Z79899 Other long term (current) drug therapy: Secondary | ICD-10-CM | POA: Insufficient documentation

## 2020-02-18 DIAGNOSIS — Z7982 Long term (current) use of aspirin: Secondary | ICD-10-CM | POA: Diagnosis not present

## 2020-02-18 DIAGNOSIS — X58XXXA Exposure to other specified factors, initial encounter: Secondary | ICD-10-CM | POA: Insufficient documentation

## 2020-02-18 HISTORY — PX: ORIF ANKLE FRACTURE: SHX5408

## 2020-02-18 HISTORY — DX: Other complications of anesthesia, initial encounter: T88.59XA

## 2020-02-18 SURGERY — OPEN REDUCTION INTERNAL FIXATION (ORIF) ANKLE FRACTURE
Anesthesia: General | Site: Ankle | Laterality: Left

## 2020-02-18 MED ORDER — PROPOFOL 10 MG/ML IV BOLUS
INTRAVENOUS | Status: AC
  Filled 2020-02-18: qty 20

## 2020-02-18 MED ORDER — ONDANSETRON HCL 4 MG/2ML IJ SOLN
INTRAMUSCULAR | Status: AC
  Filled 2020-02-18: qty 2

## 2020-02-18 MED ORDER — CLONIDINE HCL (ANALGESIA) 100 MCG/ML EP SOLN
EPIDURAL | Status: DC | PRN
  Administered 2020-02-18: 67 ug
  Administered 2020-02-18: 33 ug

## 2020-02-18 MED ORDER — LIDOCAINE HCL (CARDIAC) PF 100 MG/5ML IV SOSY
PREFILLED_SYRINGE | INTRAVENOUS | Status: DC | PRN
  Administered 2020-02-18: 80 mg via INTRAVENOUS

## 2020-02-18 MED ORDER — ACETAMINOPHEN 500 MG PO TABS
1000.0000 mg | ORAL_TABLET | Freq: Once | ORAL | Status: AC
  Administered 2020-02-18: 1000 mg via ORAL

## 2020-02-18 MED ORDER — OXYCODONE HCL 5 MG PO TABS: 5.0000 mg | ORAL_TABLET | Freq: Once | ORAL | Status: DC | PRN

## 2020-02-18 MED ORDER — BUPIVACAINE-EPINEPHRINE (PF) 0.5% -1:200000 IJ SOLN
INTRAMUSCULAR | Status: DC | PRN
  Administered 2020-02-18: 20 mL via PERINEURAL
  Administered 2020-02-18: 10 mL via PERINEURAL

## 2020-02-18 MED ORDER — PROMETHAZINE HCL 25 MG/ML IJ SOLN: 6.2500 mg | INTRAMUSCULAR | Status: DC | PRN

## 2020-02-18 MED ORDER — SODIUM CHLORIDE 0.9 % IV SOLN: INTRAVENOUS | Status: DC

## 2020-02-18 MED ORDER — VANCOMYCIN HCL 500 MG IV SOLR
INTRAVENOUS | Status: DC | PRN
  Administered 2020-02-18: 500 mg

## 2020-02-18 MED ORDER — LACTATED RINGERS IV SOLN: INTRAVENOUS | Status: DC

## 2020-02-18 MED ORDER — FENTANYL CITRATE (PF) 100 MCG/2ML IJ SOLN
100.0000 ug | Freq: Once | INTRAMUSCULAR | Status: AC
  Administered 2020-02-18: 50 ug via INTRAVENOUS

## 2020-02-18 MED ORDER — CEFAZOLIN SODIUM-DEXTROSE 2-4 GM/100ML-% IV SOLN
2.0000 g | INTRAVENOUS | Status: AC
  Administered 2020-02-18: 2 g via INTRAVENOUS

## 2020-02-18 MED ORDER — EPHEDRINE 5 MG/ML INJ
INTRAVENOUS | Status: AC
  Filled 2020-02-18: qty 10

## 2020-02-18 MED ORDER — FENTANYL CITRATE (PF) 100 MCG/2ML IJ SOLN
INTRAMUSCULAR | Status: AC
  Filled 2020-02-18: qty 2

## 2020-02-18 MED ORDER — EPHEDRINE SULFATE 50 MG/ML IJ SOLN
INTRAMUSCULAR | Status: DC | PRN
  Administered 2020-02-18 (×2): 10 mg via INTRAVENOUS

## 2020-02-18 MED ORDER — DEXAMETHASONE SODIUM PHOSPHATE 10 MG/ML IJ SOLN
INTRAMUSCULAR | Status: DC | PRN
  Administered 2020-02-18: 5 mg via INTRAVENOUS

## 2020-02-18 MED ORDER — OXYCODONE HCL 5 MG/5ML PO SOLN: 5.0000 mg | Freq: Once | ORAL | Status: DC | PRN

## 2020-02-18 MED ORDER — MIDAZOLAM HCL 2 MG/2ML IJ SOLN
2.0000 mg | Freq: Once | INTRAMUSCULAR | Status: AC
  Administered 2020-02-18: 1 mg via INTRAVENOUS

## 2020-02-18 MED ORDER — CEFAZOLIN SODIUM-DEXTROSE 2-4 GM/100ML-% IV SOLN
INTRAVENOUS | Status: AC
  Filled 2020-02-18: qty 100

## 2020-02-18 MED ORDER — PHENYLEPHRINE 40 MCG/ML (10ML) SYRINGE FOR IV PUSH (FOR BLOOD PRESSURE SUPPORT)
PREFILLED_SYRINGE | INTRAVENOUS | Status: AC
  Filled 2020-02-18: qty 10

## 2020-02-18 MED ORDER — PHENYLEPHRINE HCL (PRESSORS) 10 MG/ML IV SOLN
INTRAVENOUS | Status: DC | PRN
  Administered 2020-02-18: 80 ug via INTRAVENOUS
  Administered 2020-02-18 (×2): 40 ug via INTRAVENOUS
  Administered 2020-02-18: 80 ug via INTRAVENOUS
  Administered 2020-02-18: 40 ug via INTRAVENOUS

## 2020-02-18 MED ORDER — MIDAZOLAM HCL 2 MG/2ML IJ SOLN
INTRAMUSCULAR | Status: AC
  Filled 2020-02-18: qty 2

## 2020-02-18 MED ORDER — ONDANSETRON HCL 4 MG/2ML IJ SOLN
INTRAMUSCULAR | Status: DC | PRN
  Administered 2020-02-18: 4 mg via INTRAVENOUS

## 2020-02-18 MED ORDER — FENTANYL CITRATE (PF) 100 MCG/2ML IJ SOLN
INTRAMUSCULAR | Status: DC | PRN
  Administered 2020-02-18: 50 ug via INTRAVENOUS
  Administered 2020-02-18 (×2): 25 ug via INTRAVENOUS

## 2020-02-18 MED ORDER — DEXAMETHASONE SODIUM PHOSPHATE 10 MG/ML IJ SOLN
INTRAMUSCULAR | Status: AC
  Filled 2020-02-18: qty 1

## 2020-02-18 MED ORDER — PROPOFOL 10 MG/ML IV BOLUS
INTRAVENOUS | Status: DC | PRN
  Administered 2020-02-18: 140 mg via INTRAVENOUS

## 2020-02-18 MED ORDER — OXYCODONE HCL 5 MG PO TABS: 5.0000 mg | ORAL_TABLET | Freq: Four times a day (QID) | ORAL | 0 refills | Status: AC | PRN

## 2020-02-18 MED ORDER — ACETAMINOPHEN 500 MG PO TABS
ORAL_TABLET | ORAL | Status: AC
  Filled 2020-02-18: qty 2

## 2020-02-18 MED ORDER — FENTANYL CITRATE (PF) 100 MCG/2ML IJ SOLN: 25.0000 ug | INTRAMUSCULAR | Status: DC | PRN

## 2020-02-18 MED ORDER — LIDOCAINE 2% (20 MG/ML) 5 ML SYRINGE
INTRAMUSCULAR | Status: AC
  Filled 2020-02-18: qty 5

## 2020-02-18 SURGICAL SUPPLY — 86 items
APL PRP STRL LF DISP 70% ISPRP (MISCELLANEOUS) ×1
BANDAGE ESMARK 6X9 LF (GAUZE/BANDAGES/DRESSINGS) IMPLANT
BIT DRILL 2.5X2.75 QC CALB (BIT) ×2 IMPLANT
BIT DRILL 2.9 CANN QC NONSTRL (BIT) ×2 IMPLANT
BIT DRILL 3.5X5.5 QC CALB (BIT) ×2 IMPLANT
BLADE SURG 15 STRL LF DISP TIS (BLADE) ×2 IMPLANT
BLADE SURG 15 STRL SS (BLADE) ×4
BNDG CMPR 9X4 STRL LF SNTH (GAUZE/BANDAGES/DRESSINGS)
BNDG CMPR 9X6 STRL LF SNTH (GAUZE/BANDAGES/DRESSINGS)
BNDG COHESIVE 4X5 TAN STRL (GAUZE/BANDAGES/DRESSINGS) ×2 IMPLANT
BNDG COHESIVE 6X5 TAN STRL LF (GAUZE/BANDAGES/DRESSINGS) ×2 IMPLANT
BNDG ESMARK 4X9 LF (GAUZE/BANDAGES/DRESSINGS) IMPLANT
BNDG ESMARK 6X9 LF (GAUZE/BANDAGES/DRESSINGS)
CANISTER SUCT 1200ML W/VALVE (MISCELLANEOUS) ×2 IMPLANT
CHLORAPREP W/TINT 26 (MISCELLANEOUS) ×2 IMPLANT
COVER BACK TABLE 60X90IN (DRAPES) ×2 IMPLANT
COVER WAND RF STERILE (DRAPES) IMPLANT
CUFF TOURN SGL QUICK 34 (TOURNIQUET CUFF) ×2
CUFF TRNQT CYL 34X4.125X (TOURNIQUET CUFF) ×1 IMPLANT
DECANTER SPIKE VIAL GLASS SM (MISCELLANEOUS) IMPLANT
DRAPE EXTREMITY T 121X128X90 (DISPOSABLE) ×2 IMPLANT
DRAPE OEC MINIVIEW 54X84 (DRAPES) ×2 IMPLANT
DRAPE U-SHAPE 47X51 STRL (DRAPES) ×2 IMPLANT
DRSG MEPITEL 4X7.2 (GAUZE/BANDAGES/DRESSINGS) ×2 IMPLANT
DRSG PAD ABDOMINAL 8X10 ST (GAUZE/BANDAGES/DRESSINGS) ×4 IMPLANT
ELECT REM PT RETURN 9FT ADLT (ELECTROSURGICAL) ×2
ELECTRODE REM PT RTRN 9FT ADLT (ELECTROSURGICAL) ×1 IMPLANT
GAUZE SPONGE 4X4 12PLY STRL (GAUZE/BANDAGES/DRESSINGS) ×2 IMPLANT
GLOVE BIO SURGEON STRL SZ7 (GLOVE) ×2 IMPLANT
GLOVE BIO SURGEON STRL SZ8 (GLOVE) ×2 IMPLANT
GLOVE ECLIPSE 8.0 STRL XLNG CF (GLOVE) ×2 IMPLANT
GLOVE SRG 8 PF TXTR STRL LF DI (GLOVE) ×2 IMPLANT
GLOVE SURG UNDER POLY LF SZ7 (GLOVE) ×4 IMPLANT
GLOVE SURG UNDER POLY LF SZ8 (GLOVE) ×4
GOWN STRL REUS W/ TWL LRG LVL3 (GOWN DISPOSABLE) ×1 IMPLANT
GOWN STRL REUS W/ TWL XL LVL3 (GOWN DISPOSABLE) ×1 IMPLANT
GOWN STRL REUS W/TWL LRG LVL3 (GOWN DISPOSABLE) ×2
GOWN STRL REUS W/TWL XL LVL3 (GOWN DISPOSABLE) ×2
K-WIRE ACE 1.6X6 (WIRE) ×4
KWIRE ACE 1.6X6 (WIRE) ×2 IMPLANT
NEEDLE HYPO 22GX1.5 SAFETY (NEEDLE) IMPLANT
NS IRRIG 1000ML POUR BTL (IV SOLUTION) ×2 IMPLANT
PACK BASIN DAY SURGERY FS (CUSTOM PROCEDURE TRAY) ×2 IMPLANT
PAD CAST 4YDX4 CTTN HI CHSV (CAST SUPPLIES) ×1 IMPLANT
PADDING CAST ABS 4INX4YD NS (CAST SUPPLIES)
PADDING CAST ABS COTTON 4X4 ST (CAST SUPPLIES) IMPLANT
PADDING CAST COTTON 4X4 STRL (CAST SUPPLIES) ×2
PADDING CAST COTTON 6X4 STRL (CAST SUPPLIES) ×2 IMPLANT
PENCIL SMOKE EVACUATOR (MISCELLANEOUS) ×2 IMPLANT
PLATE ACE 100DEG 6HOLE (Plate) ×2 IMPLANT
SANITIZER HAND PURELL 535ML FO (MISCELLANEOUS) ×2 IMPLANT
SCREW ACE CAN 4.0 38M (Screw) ×2 IMPLANT
SCREW ACE CAN 4.0 40M (Screw) ×2 IMPLANT
SCREW ACE CAN 4.0 42M (Screw) ×2 IMPLANT
SCREW CORTICAL 3.5MM  16MM (Screw) ×2 IMPLANT
SCREW CORTICAL 3.5MM  20MM (Screw) ×2 IMPLANT
SCREW CORTICAL 3.5MM  34MM (Screw) ×2 IMPLANT
SCREW CORTICAL 3.5MM 14MM (Screw) ×6 IMPLANT
SCREW CORTICAL 3.5MM 16MM (Screw) ×1 IMPLANT
SCREW CORTICAL 3.5MM 18MM (Screw) ×2 IMPLANT
SCREW CORTICAL 3.5MM 20MM (Screw) ×1 IMPLANT
SCREW CORTICAL 3.5MM 22MM (Screw) ×2 IMPLANT
SCREW CORTICAL 3.5MM 34MM (Screw) ×1 IMPLANT
SCREW NLOCK CANC HEX 4X30 (Screw) ×2 IMPLANT
SCREW NLOCK T15 FT 30X4XST TIP (Screw) ×1 IMPLANT
SHEET MEDIUM DRAPE 40X70 STRL (DRAPES) ×2 IMPLANT
SLEEVE SCD COMPRESS KNEE MED (MISCELLANEOUS) ×2 IMPLANT
SPLINT FAST PLASTER 5X30 (CAST SUPPLIES) ×20
SPLINT PLASTER CAST FAST 5X30 (CAST SUPPLIES) ×20 IMPLANT
SPONGE LAP 18X18 RF (DISPOSABLE) ×2 IMPLANT
STOCKINETTE 6  STRL (DRAPES) ×2
STOCKINETTE 6 STRL (DRAPES) ×1 IMPLANT
SUCTION FRAZIER HANDLE 10FR (MISCELLANEOUS) ×2
SUCTION TUBE FRAZIER 10FR DISP (MISCELLANEOUS) ×1 IMPLANT
SUT ETHILON 3 0 PS 1 (SUTURE) ×2 IMPLANT
SUT FIBERWIRE #2 38 T-5 BLUE (SUTURE)
SUT MNCRL AB 3-0 PS2 18 (SUTURE) IMPLANT
SUT VIC AB 0 SH 27 (SUTURE) IMPLANT
SUT VIC AB 2-0 SH 27 (SUTURE) ×2
SUT VIC AB 2-0 SH 27XBRD (SUTURE) ×1 IMPLANT
SUTURE FIBERWR #2 38 T-5 BLUE (SUTURE) IMPLANT
SYR BULB EAR ULCER 3OZ GRN STR (SYRINGE) ×2 IMPLANT
SYR CONTROL 10ML LL (SYRINGE) IMPLANT
TOWEL GREEN STERILE FF (TOWEL DISPOSABLE) ×4 IMPLANT
TUBE CONNECTING 20X1/4 (TUBING) ×2 IMPLANT
UNDERPAD 30X36 HEAVY ABSORB (UNDERPADS AND DIAPERS) ×2 IMPLANT

## 2020-02-18 NOTE — Transfer of Care (Signed)
Immediate Anesthesia Transfer of Care Note  Patient: Maria Lucas  Procedure(s) Performed: OPEN REDUCTION INTERNAL FIXATION (ORIF) Left trimalleolar fracture,  syndesmosis repair (Left Ankle)  Patient Location: PACU  Anesthesia Type:General and Regional  Level of Consciousness: awake, alert  and oriented  Airway & Oxygen Therapy: Patient Spontanous Breathing and Patient connected to face mask oxygen  Post-op Assessment: Report given to RN and Post -op Vital signs reviewed and stable  Post vital signs: Reviewed and stable  Last Vitals:  Vitals Value Taken Time  BP    Temp 36.4 C 02/18/20 1600  Pulse    Resp    SpO2      Last Pain:  Vitals:   02/18/20 1404  TempSrc: Oral  PainSc: 0-No pain      Patients Stated Pain Goal: 5 (02/18/20 1404)  Complications: No complications documented.

## 2020-02-18 NOTE — Anesthesia Procedure Notes (Signed)
Anesthesia Regional Block: Popliteal block   Pre-Anesthetic Checklist: ,, timeout performed, Correct Patient, Correct Site, Correct Laterality, Correct Procedure, Correct Position, site marked, Risks and benefits discussed, pre-op evaluation,  At surgeon's request and post-op pain management  Laterality: Left  Prep: Maximum Sterile Barrier Precautions used, chloraprep       Needles:  Injection technique: Single-shot  Needle Type: Echogenic Stimulator Needle     Needle Length: 9cm  Needle Gauge: 22     Additional Needles:   Procedures:,,,, ultrasound used (permanent image in chart),,,,  Narrative:  Start time: 02/18/2020 2:35 PM End time: 02/18/2020 2:37 PM Injection made incrementally with aspirations every 5 mL.  Performed by: Personally  Anesthesiologist: Kaylyn Layer, MD  Additional Notes: Risks, benefits, and alternative discussed. Patient gave consent for procedure. Patient prepped and draped in sterile fashion. Sedation administered, patient remains easily responsive to voice. Relevant anatomy identified with ultrasound guidance. Local anesthetic given in 5cc increments with no signs or symptoms of intravascular injection. No pain or paraesthesias with injection. Patient monitored throughout procedure with signs of LAST or immediate complications. Tolerated well. Ultrasound image placed in chart.  Maria Greenhouse, MD

## 2020-02-18 NOTE — Anesthesia Procedure Notes (Signed)
Procedure Name: LMA Insertion Date/Time: 02/18/2020 2:54 PM Performed by: Lauralyn Primes, CRNA Pre-anesthesia Checklist: Patient identified, Emergency Drugs available, Suction available and Patient being monitored Patient Re-evaluated:Patient Re-evaluated prior to induction Oxygen Delivery Method: Circle system utilized Preoxygenation: Pre-oxygenation with 100% oxygen Induction Type: IV induction Ventilation: Mask ventilation without difficulty LMA: LMA inserted LMA Size: 4.0 Number of attempts: 1 Airway Equipment and Method: Bite block Placement Confirmation: positive ETCO2 Tube secured with: Tape Dental Injury: Teeth and Oropharynx as per pre-operative assessment

## 2020-02-18 NOTE — Discharge Instructions (Signed)
Toni Arthurs, MD [facility]  Please read the following information regarding your care after surgery.  Medications  You only need a prescription for the narcotic pain medicine (ex. oxycodone, Percocet, Norco).  All of the other medicines listed below are available over the counter. X Aleve 2 pills twice a day for the first 3 days after surgery. X acetominophen (Tylenol) 650 mg every 4-6 hours as you need for minor to moderate pain X oxycodone as prescribed for severe pain  Narcotic pain medicine (ex. oxycodone, Percocet, Vicodin) will cause constipation.  To prevent this problem, take the following medicines while you are taking any pain medicine. X docusate sodium (Colace) 100 mg twice a day X senna (Senokot) 2 tablets twice a day  X To help prevent blood clots, take a baby aspirin (81 mg) twice a day for two weeks after surgery.  You should also get up every hour while you are awake to move around.    Weight Bearing ? Bear weight when you are able on your operated leg or foot. ? Bear weight only on your operated foot in the post-op shoe. X Do not bear any weight on the operated leg or foot.  Cast / Splint / Dressing X Keep your splint, cast or dressing clean and dry.  Dont put anything (coat hanger, pencil, etc) down inside of it.  If it gets damp, use a hair dryer on the cool setting to dry it.  If it gets soaked, call the office to schedule an appointment for a cast change. ? Remove your dressing 3 days after surgery and cover the incisions with dry dressings.    After your dressing, cast or splint is removed; you may shower, but do not soak or scrub the wound.  Allow the water to run over it, and then gently pat it dry.  Swelling It is normal for you to have swelling where you had surgery.  To reduce swelling and pain, keep your toes above your nose for at least 3 days after surgery.  It may be necessary to keep your foot or leg elevated for several weeks.  If it hurts, it should be  elevated.  Follow Up Call my office at [phone] when you are discharged from the hospital or surgery center to schedule an appointment to be seen two weeks after surgery.  Call my office at [phone] if you develop a fever >101.5 F, nausea, vomiting, bleeding from the surgical site or severe pain.      May take tylenol after 8pm, if needed.   Post Anesthesia Home Care Instructions  Activity: Get plenty of rest for the remainder of the day. A responsible individual must stay with you for 24 hours following the procedure.  For the next 24 hours, DO NOT: -Drive a car -Advertising copywriter -Drink alcoholic beverages -Take any medication unless instructed by your physician -Make any legal decisions or sign important papers.  Meals: Start with liquid foods such as gelatin or soup. Progress to regular foods as tolerated. Avoid greasy, spicy, heavy foods. If nausea and/or vomiting occur, drink only clear liquids until the nausea and/or vomiting subsides. Call your physician if vomiting continues.  Special Instructions/Symptoms: Your throat may feel dry or sore from the anesthesia or the breathing tube placed in your throat during surgery. If this causes discomfort, gargle with warm salt water. The discomfort should disappear within 24 hours.  If you had a scopolamine patch placed behind your ear for the management of post- operative nausea  and/or vomiting:  1. The medication in the patch is effective for 72 hours, after which it should be removed.  Wrap patch in a tissue and discard in the trash. Wash hands thoroughly with soap and water. 2. You may remove the patch earlier than 72 hours if you experience unpleasant side effects which may include dry mouth, dizziness or visual disturbances. 3. Avoid touching the patch. Wash your hands with soap and water after contact with the patch.    Regional Anesthesia Blocks  1. Numbness or the inability to move the "blocked" extremity may last  from 3-48 hours after placement. The length of time depends on the medication injected and your individual response to the medication. If the numbness is not going away after 48 hours, call your surgeon.  2. The extremity that is blocked will need to be protected until the numbness is gone and the  Strength has returned. Because you cannot feel it, you will need to take extra care to avoid injury. Because it may be weak, you may have difficulty moving it or using it. You may not know what position it is in without looking at it while the block is in effect.  3. For blocks in the legs and feet, returning to weight bearing and walking needs to be done carefully. You will need to wait until the numbness is entirely gone and the strength has returned. You should be able to move your leg and foot normally before you try and bear weight or walk. You will need someone to be with you when you first try to ensure you do not fall and possibly risk injury.  4. Bruising and tenderness at the needle site are common side effects and will resolve in a few days.  5. Persistent numbness or new problems with movement should be communicated to the surgeon or the [facility] [phone] [facility] [phone]).

## 2020-02-18 NOTE — Op Note (Signed)
02/18/2020  4:02 PM  PATIENT:  Maria Lucas  60 y.o. female  PRE-OPERATIVE DIAGNOSIS:  left ankle trimalleolar fracture  POST-OPERATIVE DIAGNOSIS:  left ankle trimalleolar fracture  Procedure(s): 1.  Open treatment of left ankle trimalleolar fracture with internal fixation including fixation of the posterior lip 2.  Stress examination of the left ankle under fluoroscopy 3.  AP, mortise and lateral radiographs of the left ankle  SURGEON:  Toni Arthurs, MD  ASSISTANT: None  ANESTHESIA:   General, regional  EBL:  minimal   TOURNIQUET:   Total Tourniquet Time Documented: Thigh (Left) - 44 minutes Total: Thigh (Left) - 44 minutes  COMPLICATIONS:  None apparent  DISPOSITION:  Extubated, awake and stable to recovery.  INDICATION FOR PROCEDURE: The patient is a 60 year old female without significant past medical history.  [place] about a week and a half ago injuring her left ankle.  Radiographs reveal a displaced trimalleolar fracture.  She presents now for operative treatment of this displaced and unstable left ankle injury.  The risks and benefits of the alternative treatment options have been discussed in detail.  The patient wishes to proceed with surgery and specifically understands risks of bleeding, infection, nerve damage, blood clots, need for additional surgery, amputation and death.  PROCEDURE IN DETAIL:  After pre operative consent was obtained, and the correct operative site was identified, the patient was brought to the operating room and placed supine on the OR table.  Anesthesia was administered.  Pre-operative antibiotics were administered.  A surgical timeout was taken.  The left lower extremity was prepped and draped in standard sterile fashion with a tourniquet around the thigh.  The extremity was elevated and the tourniquet was inflated to 300 mmHg.  A longitudinal incision was made over the lateral malleolus.  Dissection was carried down through the  subcutaneous tissues.  Fracture site was mobilized and cleaned of all hematoma.  A Weber tenaculum was placed through the fracture site and used to reduce and compress the posterior malleolus fracture.  Radiographs confirmed appropriate reduction of the fracture.  While the fracture was adequately compressed a guidepin was inserted from anterior to posterior across the fracture site.  A 4 mm partially-threaded cannulated screw was inserted over the guidewire after predrilling.  The screw was noted to have excellent purchase.  The guidewire was removed.  Lateral malleolus fracture was then reduced and held with a tenaculum.  A 3.5 mm fully threaded lag screw was inserted from anterior to posterior across the fracture site.  It was noted have excellent purchase.  A 6 hole one third tubular plate was contoured to fit the lateral malleolus.  It was secured proximally with 3 bicortical screws and distally with 3 unicortical screws.  Attention was turned to the medial ankle where a longitudinal incision was made over the medial malleolus.  Dissection was carried down through the subcutaneous tissues.  The fracture site was identified.  It was cleaned of all hematoma and irrigated.  There was some comminution noted.  The fracture was reduced and held with a tenaculum.  2 guidewires were placed through the tip of the medial malleolus and across the fracture site.  Both were overdrilled and partially-threaded cannulated screws were inserted.  Due to the comminution the fracture was over reduced.  Both of the cannulated screws were backed out and a fully threaded cortical screw inserted proximally parallel to the joint line.  18-gauge surgical wire was then placed over the screw and around the  2 lag screws as a tension band.  The tension band was securely tightened holding the comminuted fragments in place.  The 2 distal screws in the proximal screw were all tightened securely.  Final AP, lateral and mortise radiographs  confirmed appropriate position and length of all hardware and appropriate reduction of the 3 fractures.  Stress examination was then performed.  Dorsiflexion external rotation stress was applied to the supinated forefoot with a mortise view on live fluoroscopy.  There was no widening of the medial clear space or decrease in the tib-fib overlap.  The wounds were irrigated copiously and sprinkled with vancomycin powder.  Subcutaneous tissues were approximated with Vicryl.  Skin incisions were closed with nylon.  Sterile dressings were applied followed by a well-padded short leg splint.  The tourniquet was released after application of the dressings.  The patient was awakened from anesthesia and transported to the recovery room in stable condition.   FOLLOW UP PLAN: Nonweightbearing on the left lower extremity.  Aspirin for DVT prophylaxis.  Follow-up in the office in 2 weeks for suture removal and conversion to a short leg cast.  Plan 6 weeks nonweightbearing immobilization.   RADIOGRAPHS: AP, lateral and mortise radiographs of the left ankle are obtained intraoperatively.  These show interval reduction and fixation of all 3 malleolus fractures.  Hardware is appropriately positioned and of the appropriate length.  No other acute injuries are noted.

## 2020-02-18 NOTE — Anesthesia Preprocedure Evaluation (Signed)
Anesthesia Evaluation  Patient identified by MRN, date of birth, ID band Patient awake    Reviewed: Allergy & Precautions, NPO status , Patient's Chart, lab work & pertinent test results  History of Anesthesia Complications Negative for: history of anesthetic complications  Airway Mallampati: II  TM Distance: >3 FB Neck ROM: Full    Dental no notable dental hx.    Pulmonary neg pulmonary ROS,    Pulmonary exam normal        Cardiovascular negative cardio ROS Normal cardiovascular exam     Neuro/Psych negative neurological ROS  negative psych ROS   GI/Hepatic Neg liver ROS, GERD  ,  Endo/Other  negative endocrine ROS  Renal/GU negative Renal ROS  negative genitourinary   Musculoskeletal negative musculoskeletal ROS (+)   Abdominal   Peds  Hematology  (+) REFUSES BLOOD PRODUCTS, JEHOVAH'S WITNESS  Anesthesia Other Findings Day of surgery medications reviewed with patient.  Reproductive/Obstetrics negative OB ROS                            Anesthesia Physical Anesthesia Plan  ASA: I  Anesthesia Plan: General   Post-op Pain Management: GA combined w/ Regional for post-op pain   Induction: Intravenous  PONV Risk Score and Plan: 3 and Treatment may vary due to age or medical condition, Midazolam, Ondansetron and Dexamethasone  Airway Management Planned: LMA  Additional Equipment: None  Intra-op Plan:   Post-operative Plan: Extubation in OR  Informed Consent: I have reviewed the patients History and Physical, chart, labs and discussed the procedure including the risks, benefits and alternatives for the proposed anesthesia with the patient or authorized representative who has indicated his/her understanding and acceptance.     Dental advisory given  Plan Discussed with: CRNA  Anesthesia Plan Comments:        Anesthesia Quick Evaluation

## 2020-02-18 NOTE — H&P (Signed)
Maria Lucas is an 60 y.o. female.   Chief Complaint: left ankle injury HPI: The patient is a 60 year old female without significant past medical history.  She [place] her left ankle [place] about a week and a half ago.  She has a displaced left ankle trimalleolar fracture and presents now for operative treatment of this unstable left ankle injury.  Past Medical History:  Diagnosis Date  . Anemia    menorraghia  . Breast mass, left 03/21/2011  . Complication of anesthesia    slow to wake up  . Elevated antinuclear antibody (ANA) level 03/12/2011  . Glaucoma 03/12/2011  . Heart murmur   . Hyperlipidemia 03/12/2011  . Palpitations   . Refusal of blood transfusions as patient is Jehovah's Witness 10/12/2014    Past Surgical History:  Procedure Laterality Date  . LACERATION REPAIR     age 81, dog attack, stitches in face, multipe sites right side of face    Family History  Problem Relation Age of Onset  . Heart attack Father 86  . Hypertension Father   . Hyperlipidemia Father   . Heart disease Father 50       MI  . Varicose Veins Father   . Hypertension Sister   . Ovarian cysts Sister   . Hyperlipidemia Sister   . Heart attack Brother 43  . Hypertension Brother   . Heart disease Brother   . Hyperlipidemia Brother   . Anxiety disorder Daughter   . OCD Daughter   . Heart attack Maternal Grandmother   . Hypertension Maternal Grandmother        valvular heart disease  . Heart disease Maternal Grandmother   . Vascular Disease Maternal Grandmother   . Cancer Maternal Grandfather        skin, bcc  . Glaucoma Maternal Grandfather   . Stroke Paternal Grandfather   . Cancer Mother        skin, bcc, scc  . Heart disease Mother        arrythmia  . Hyperlipidemia Mother   . Melanoma Mother   . Osteoporosis Mother   . Colon cancer Neg Hx    Social History:  reports that she has never smoked. She has never used smokeless tobacco. She reports that she does not drink  alcohol and does not use drugs.  Allergies:  Allergies  Allergen Reactions  . Penicillins Rash  . Clindamycin Hcl Rash    Medications Prior to Admission  Medication Sig Dispense Refill  . aspirin 81 MG tablet Take 81 mg by mouth daily.    Marland Kitchen atorvastatin (LIPITOR) 10 MG tablet Take 1 tablet (10 mg total) by mouth at bedtime. 90 tablet 1  . Calcium-Magnesium-Vitamin D (CITRACAL CALCIUM+D PO) Take by mouth daily.    . Cholecalciferol (VITAMIN D) 2000 units CAPS Take 1 capsule (2,000 Units total) by mouth daily. 30 capsule   . Coenzyme Q10 10 MG capsule Take 1 capsule by mouth daily.    . Flaxseed Oil OIL Take 1,400 mg by mouth daily.    . IRON, FERROUS GLUCONATE, PO Take 65 mg by mouth.    . latanoprost (XALATAN) 0.005 % ophthalmic solution Place 1 drop into both eyes at bedtime.    Marland Kitchen loratadine-pseudoephedrine (CLARITIN-D 24-HOUR) 10-240 MG 24 hr tablet Take 1 tablet by mouth daily.    . multivitamin-iron-minerals-folic acid (CENTRUM) chewable tablet Chew 1 tablet by mouth daily.    . Omega-3 350 MG CPDR Take by mouth.    Marland Kitchen  Probiotic Product (PROBIOTIC DAILY PO) Take 2 each by mouth.    . fluticasone (FLONASE) 50 MCG/ACT nasal spray Place 2 sprays into both nostrils daily. 16 g 6    No results found for this or any previous visit (from the past 48 hour(s)). No results found.  Review of Systems no recent fever, chills, nausea, vomiting or changes in her appetite  Blood pressure 132/66, pulse 90, temperature 99.1 F (37.3 C), temperature source Oral, resp. rate (!) 21, height 5\' 4"  (1.626 m), weight 71.9 kg, last menstrual period 11/07/2012, SpO2 100 %. Physical Exam  Well-nourished well-developed woman in no apparent distress.  Alert and oriented x4.  Normal mood and affect.  Gait is nonweightbearing on the left.  Left ankle has healthy skin and palpable pulses in the foot.  5 out of 5 strength in plantarflexion and dorsiflexion of the toes.  Moderate swelling around the ankle.  No  lymphadenopathy.   Assessment/Plan Displaced left ankle trimalleolar fracture -to the operating room today for open treatment with internal fixation.  The risks and benefits of the alternative treatment options have been discussed in detail.  The patient wishes to proceed with surgery and specifically understands risks of bleeding, infection, nerve damage, blood clots, need for additional surgery, amputation and death.   01/07/2013, MD 2020-03-11, 2:38 PM

## 2020-02-18 NOTE — Progress Notes (Signed)
Assisted Dr. Howze with left, ultrasound guided, popliteal, adductor canal block. Side rails up, monitors on throughout procedure. See vital signs in flow sheet. Tolerated Procedure well. 

## 2020-02-18 NOTE — Anesthesia Procedure Notes (Signed)
Anesthesia Regional Block: Adductor canal block   Pre-Anesthetic Checklist: ,, timeout performed, Correct Patient, Correct Site, Correct Laterality, Correct Procedure, Correct Position, site marked, Risks and benefits discussed, pre-op evaluation,  At surgeon's request and post-op pain management  Laterality: Left  Prep: Maximum Sterile Barrier Precautions used, chloraprep       Needles:  Injection technique: Single-shot  Needle Type: Echogenic Stimulator Needle     Needle Length: 9cm  Needle Gauge: 22     Additional Needles:   Procedures:,,,, ultrasound used (permanent image in chart),,,,  Narrative:  Start time: 02/18/2020 2:33 PM End time: 02/18/2020 2:35 PM Injection made incrementally with aspirations every 5 mL.  Performed by: Personally  Anesthesiologist: Kaylyn Layer, MD  Additional Notes: Risks, benefits, and alternative discussed. Patient gave consent for procedure. Patient prepped and draped in sterile fashion. Sedation administered, patient remains easily responsive to voice. Relevant anatomy identified with ultrasound guidance. Local anesthetic given in 5cc increments with no signs or symptoms of intravascular injection. No pain or paraesthesias with injection. Patient monitored throughout procedure with signs of LAST or immediate complications. Tolerated well. Ultrasound image placed in chart.  Amalia Greenhouse, MD

## 2020-02-18 NOTE — Anesthesia Postprocedure Evaluation (Signed)
Anesthesia Post Note  Patient: Maria Lucas  Procedure(s) Performed: OPEN REDUCTION INTERNAL FIXATION (ORIF) Left trimalleolar fracture,  syndesmosis repair (Left Ankle)     Patient location during evaluation: PACU Anesthesia Type: General Level of consciousness: awake and alert Pain management: pain level controlled Vital Signs Assessment: post-procedure vital signs reviewed and stable Respiratory status: spontaneous breathing, nonlabored ventilation, respiratory function stable and patient connected to nasal cannula oxygen Cardiovascular status: blood pressure returned to baseline and stable Postop Assessment: no apparent nausea or vomiting Anesthetic complications: no   No complications documented.  Last Vitals:  Vitals:   02/18/20 1613 02/18/20 1630  BP: 131/72 136/69  Pulse: 98 92  Resp: 17 16  Temp:  37.1 C  SpO2: 100% 99%    Last Pain:  Vitals:   02/18/20 1630  TempSrc:   PainSc: 0-No pain                 Cecile Hearing

## 2020-02-23 ENCOUNTER — Encounter (HOSPITAL_BASED_OUTPATIENT_CLINIC_OR_DEPARTMENT_OTHER): Payer: Self-pay | Admitting: Orthopedic Surgery

## 2020-03-10 ENCOUNTER — Encounter: Payer: 59 | Admitting: Family Medicine

## 2020-08-14 ENCOUNTER — Other Ambulatory Visit: Payer: Self-pay | Admitting: Family Medicine

## 2020-09-19 ENCOUNTER — Other Ambulatory Visit: Payer: Self-pay

## 2020-09-19 ENCOUNTER — Ambulatory Visit (INDEPENDENT_AMBULATORY_CARE_PROVIDER_SITE_OTHER): Payer: 59 | Admitting: Family Medicine

## 2020-09-19 ENCOUNTER — Other Ambulatory Visit (HOSPITAL_COMMUNITY)
Admission: RE | Admit: 2020-09-19 | Discharge: 2020-09-19 | Disposition: A | Discharge status: Home / Self Care | Payer: 59 | Source: Ambulatory Visit | Attending: Family Medicine | Admitting: Family Medicine

## 2020-09-19 ENCOUNTER — Encounter: Payer: Self-pay | Admitting: Family Medicine

## 2020-09-19 VITALS — BP 132/76 | HR 95 | Temp 98.6°F | Resp 16 | Ht 64.0 in | Wt 157.6 lb

## 2020-09-19 DIAGNOSIS — Z780 Asymptomatic menopausal state: Secondary | ICD-10-CM | POA: Diagnosis not present

## 2020-09-19 DIAGNOSIS — Z0000 Encounter for general adult medical examination without abnormal findings: Secondary | ICD-10-CM

## 2020-09-19 DIAGNOSIS — R000 Tachycardia, unspecified: Secondary | ICD-10-CM | POA: Diagnosis not present

## 2020-09-19 DIAGNOSIS — Z124 Encounter for screening for malignant neoplasm of cervix: Secondary | ICD-10-CM | POA: Insufficient documentation

## 2020-09-19 DIAGNOSIS — E2839 Other primary ovarian failure: Secondary | ICD-10-CM

## 2020-09-19 DIAGNOSIS — M79672 Pain in left foot: Secondary | ICD-10-CM

## 2020-09-19 DIAGNOSIS — Z1239 Encounter for other screening for malignant neoplasm of breast: Secondary | ICD-10-CM | POA: Diagnosis not present

## 2020-09-19 DIAGNOSIS — R739 Hyperglycemia, unspecified: Secondary | ICD-10-CM

## 2020-09-19 DIAGNOSIS — E785 Hyperlipidemia, unspecified: Secondary | ICD-10-CM | POA: Diagnosis not present

## 2020-09-19 DIAGNOSIS — S82852S Displaced trimalleolar fracture of left lower leg, sequela: Secondary | ICD-10-CM

## 2020-09-19 DIAGNOSIS — N952 Postmenopausal atrophic vaginitis: Secondary | ICD-10-CM

## 2020-09-19 LAB — COMPREHENSIVE METABOLIC PANEL
ALT: 19 U/L (ref 0–35)
AST: 22 U/L (ref 0–37)
Albumin: 4.7 g/dL (ref 3.5–5.2)
Alkaline Phosphatase: 102 U/L (ref 39–117)
BUN: 14 mg/dL (ref 6–23)
CO2: 30 mEq/L (ref 19–32)
Calcium: 10 mg/dL (ref 8.4–10.5)
Chloride: 102 mEq/L (ref 96–112)
Creatinine, Ser: 0.83 mg/dL (ref 0.40–1.20)
GFR: 77.16 mL/min (ref >60.00)
Glucose, Bld: 99 mg/dL (ref 70–99)
Potassium: 4.2 mEq/L (ref 3.5–5.1)
Sodium: 141 mEq/L (ref 135–145)
Total Bilirubin: 0.6 mg/dL (ref 0.2–1.2)
Total Protein: 7.1 g/dL (ref 6.0–8.3)

## 2020-09-19 LAB — TSH: TSH: 2.09 u[IU]/mL (ref 0.35–5.50)

## 2020-09-19 LAB — LIPID PANEL
Cholesterol: 202 mg/dL — ABNORMAL HIGH (ref 0–200)
HDL: 85.2 mg/dL (ref >39.00)
LDL Cholesterol: 90 mg/dL (ref 0–99)
NonHDL: 117.02
Total CHOL/HDL Ratio: 2
Triglycerides: 137 mg/dL (ref 0.0–149.0)
VLDL: 27.4 mg/dL (ref 0.0–40.0)

## 2020-09-19 LAB — CBC
HCT: 40.1 % (ref 36.0–46.0)
Hemoglobin: 13.4 g/dL (ref 12.0–15.0)
MCHC: 33.3 g/dL (ref 30.0–36.0)
MCV: 93.2 fl (ref 78.0–100.0)
Platelets: 194 10*3/uL (ref 150.0–400.0)
RBC: 4.3 Mil/uL (ref 3.87–5.11)
RDW: 13.1 % (ref 11.5–15.5)
WBC: 5.9 10*3/uL (ref 4.0–10.5)

## 2020-09-19 LAB — HEMOGLOBIN A1C: Hgb A1c MFr Bld: 6 % (ref 4.6–6.5)

## 2020-09-19 NOTE — Assessment & Plan Note (Addendum)
Mild asymptomatic 

## 2020-09-19 NOTE — Assessment & Plan Note (Addendum)
Patient encouraged to maintain heart healthy diet, regular exercise, adequate sleep. Consider daily probiotics. Take medications as prescribed. Pap today, MGM due, last done 2018, Dexa in next 12 months, Colonscopy 2015 repeat in 2025. Declines Shingrix, has had 2 COVID shots

## 2020-09-19 NOTE — Assessment & Plan Note (Signed)
Thinning noted of the labia majora. Will try premarin cream topically to area 2 x a week and reassess.

## 2020-09-19 NOTE — Assessment & Plan Note (Signed)
Encourage heart healthy diet such as MIND or DASH diet, increase exercise, avoid trans fats, simple carbohydrates and processed foods, consider a krill or fish or flaxseed oil cap daily.  °

## 2020-09-19 NOTE — Assessment & Plan Note (Signed)
hgba1c acceptable, minimize simple carbs. Increase exercise as tolerated.  

## 2020-09-19 NOTE — Assessment & Plan Note (Signed)
Has been released by Dr Victorino Dike after surgical correction.

## 2020-09-19 NOTE — Progress Notes (Signed)
Subjective:   By signing my name below, I, Zite Okoli, attest that this documentation has been prepared under the direction and in the presence of Bradd Canary, MD 09/19/2020   Patient ID: [name], female    DOB: 10-25-1960, 60 y.o.   MRN: [id number]  Chief Complaint  Patient presents with   Annual Exam    HPI Patient is in today for a comprehensive physical exam.  She reports that while taking a walk at the beginning of the year, she twisted her left ankle and fell down.  She had a surgery to put some plates and screws in her leg with Dr Victorino Dike. She has finished physical therapy and is still limping.   She denies fever, congestion, eye pain, shortness of breath, chest pain, palpitations, leg swelling. Also denies abdominal pain, blood in stool, dysuria, frequency, diarrhea, nausea, back pain and headaches.  She is UTD on tetanus vaccines. She has 2 Pfizer Covid-19 vaccines at this time. She is not willing to get the shingles vaccine at this time.   She is UTD on dental care and will need surgery for her gums receding.  There has no recent changes in her family medical history.  Past Medical History:  Diagnosis Date   Anemia    menorraghia   Breast mass, left 03/21/2011   Complication of anesthesia    slow to wake up   Elevated antinuclear antibody (ANA) level 03/12/2011   Glaucoma 03/12/2011   Heart murmur    Hyperlipidemia 03/12/2011   Palpitations    Refusal of blood transfusions as patient is Jehovah's Witness 10/12/2014    Past Surgical History:  Procedure Laterality Date   LACERATION REPAIR     age 89, dog attack, stitches in face, multipe sites right side of face   ORIF ANKLE FRACTURE Left 02/18/2020   Procedure: OPEN REDUCTION INTERNAL FIXATION (ORIF) Left trimalleolar fracture,  syndesmosis repair;  Surgeon: Toni Arthurs, MD;  Location: [facility];  Service: Orthopedics;  Laterality: Left;     Family History  Problem Relation Age of Onset    Heart attack Father 82   Hypertension Father    Hyperlipidemia Father    Heart disease Father 72       MI   Varicose Veins Father    Hypertension Sister    Ovarian cysts Sister    Hyperlipidemia Sister    Heart attack Brother 61   Hypertension Brother    Heart disease Brother    Hyperlipidemia Brother    Anxiety disorder Daughter    OCD Daughter    Heart attack Maternal Grandmother    Hypertension Maternal Grandmother        valvular heart disease   Heart disease Maternal Grandmother    Vascular Disease Maternal Grandmother    Cancer Maternal Grandfather        skin, bcc   Glaucoma Maternal Grandfather    Stroke Paternal Grandfather    Cancer Mother        skin, bcc, scc   Heart disease Mother        arrythmia   Hyperlipidemia Mother    Melanoma Mother    Osteoporosis Mother    Colon cancer Neg Hx     Social History   Socioeconomic History   Marital status: Married    Spouse name: Not on file   Number of children: Not on file   Years of education: Not on file   Highest education level: Not on  file  Occupational History   Not on file  Tobacco Use   Smoking status: Never   Smokeless tobacco: Never  Substance and Sexual Activity   Alcohol use: No   Drug use: No   Sexual activity: Yes    Partners: Male  Other Topics Concern   Not on file  Social History Narrative   Not on file   Social Determinants of Health   Financial Resource Strain: Not on file  Food Insecurity: Not on file  Transportation Needs: Not on file  Physical Activity: Not on file  Stress: Not on file  Social Connections: Not on file  Intimate Partner Violence: Not on file    Outpatient Medications Prior to Visit  Medication Sig Dispense Refill   aspirin 81 MG tablet Take 81 mg by mouth daily.     atorvastatin (LIPITOR) 10 MG tablet TAKE ONE TABLET BY MOUTH DAILY AT BEDTIME 90 tablet 0   Calcium-Magnesium-Vitamin D (CITRACAL CALCIUM+D PO) Take by mouth daily.     Cholecalciferol  (VITAMIN D) 2000 units CAPS Take 1 capsule (2,000 Units total) by mouth daily. 30 capsule    Coenzyme Q10 10 MG capsule Take 1 capsule by mouth daily.     Flaxseed Oil OIL Take 1,400 mg by mouth daily.     IRON, FERROUS GLUCONATE, PO Take 65 mg by mouth.     latanoprost (XALATAN) 0.005 % ophthalmic solution Place 1 drop into both eyes at bedtime.     multivitamin-iron-minerals-folic acid (CENTRUM) chewable tablet Chew 1 tablet by mouth daily.     Omega-3 350 MG CPDR Take by mouth.     Probiotic Product (PROBIOTIC DAILY PO) Take 2 each by mouth.     fluticasone (FLONASE) 50 MCG/ACT nasal spray Place 2 sprays into both nostrils daily. (Patient not taking: Reported on 09/19/2020) 16 g 6   loratadine-pseudoephedrine (CLARITIN-D 24-HOUR) 10-240 MG 24 hr tablet Take 1 tablet by mouth daily. (Patient not taking: Reported on 09/19/2020)     No facility-administered medications prior to visit.    Allergies  Allergen Reactions   Penicillins Rash   Clindamycin Hcl Rash    Review of Systems  Constitutional:  Negative for fever.  HENT:  Negative for congestion.   Eyes:  Negative for pain.  Respiratory:  Negative for shortness of breath.   Cardiovascular:  Negative for chest pain, palpitations and leg swelling.  Gastrointestinal:  Negative for abdominal pain, blood in stool, diarrhea and nausea.  Genitourinary:  Negative for dysuria and frequency.  Musculoskeletal:  Negative for back pain.  Neurological:  Negative for headaches.      Objective:    Physical Exam Constitutional:      General: She is not in acute distress.    Appearance: She is well-developed.  HENT:     Head: Normocephalic and atraumatic.     Right Ear: Tympanic membrane, ear canal and external ear normal.     Left Ear: Tympanic membrane, ear canal and external ear normal.  Eyes:     Conjunctiva/sclera: Conjunctivae normal.     Comments: (-) nystagmus  Neck:     Thyroid: No thyromegaly.  Cardiovascular:     Rate and  Rhythm: Normal rate and regular rhythm.     Heart sounds: Normal heart sounds. No murmur heard. Pulmonary:     Effort: Pulmonary effort is normal. No respiratory distress.     Breath sounds: Normal breath sounds.  Abdominal:     General: Bowel sounds are normal. There is  no distension.     Palpations: Abdomen is soft. There is no mass.     Tenderness: There is no abdominal tenderness.  Genitourinary:    Vagina: No vaginal discharge.     Rectum: Normal.     Comments: Thinning of the labia majora and some adherence to the wall on the anterior aspect. Cervix without lesion, discharge. No CMT Musculoskeletal:     Cervical back: Neck supple.     Comments: 5/5 strength in upper and lower extremities  Lymphadenopathy:     Cervical: No cervical adenopathy.  Skin:    General: Skin is warm and dry.  Neurological:     Mental Status: She is alert and oriented to person, place, and time.     Deep Tendon Reflexes:     Reflex Scores:      Patellar reflexes are 2+ on the right side and 2+ on the left side. Psychiatric:        Behavior: Behavior normal.    BP 132/76   Pulse 95   Temp 98.6 F (37 C)   Resp 16   Ht 5\' 4"  (1.626 m)   Wt 157 lb 9.6 oz (71.5 kg)   LMP 11/07/2012   SpO2 96%   BMI 27.05 kg/m  Wt Readings from Last 3 Encounters:  09/19/20 157 lb 9.6 oz (71.5 kg)  02/18/20 158 lb 8.2 oz (71.9 kg)  09/03/19 154 lb 3.2 oz (69.9 kg)    Diabetic Foot Exam - Simple   No data filed    Lab Results  Component Value Date   WBC 5.4 09/03/2019   HGB 13.7 09/03/2019   HCT 41.1 09/03/2019   PLT 175.0 09/03/2019   GLUCOSE 100 (H) 09/03/2019   CHOL 186 09/03/2019   TRIG 115.0 09/03/2019   HDL 88.50 09/03/2019   LDLDIRECT 122.7 03/26/2012   LDLCALC 75 09/03/2019   ALT 16 09/03/2019   AST 22 09/03/2019   NA 139 09/03/2019   K 4.0 09/03/2019   CL 103 09/03/2019   CREATININE 0.93 09/03/2019   BUN 16 09/03/2019   CO2 31 09/03/2019   TSH 1.98 09/03/2019   HGBA1C 5.8  09/03/2019    Lab Results  Component Value Date   TSH 1.98 09/03/2019   Lab Results  Component Value Date   WBC 5.4 09/03/2019   HGB 13.7 09/03/2019   HCT 41.1 09/03/2019   MCV 94.6 09/03/2019   PLT 175.0 09/03/2019   Lab Results  Component Value Date   NA 139 09/03/2019   K 4.0 09/03/2019   CO2 31 09/03/2019   GLUCOSE 100 (H) 09/03/2019   BUN 16 09/03/2019   CREATININE 0.93 09/03/2019   BILITOT 0.5 09/03/2019   ALKPHOS 80 09/03/2019   AST 22 09/03/2019   ALT 16 09/03/2019   PROT 7.5 09/03/2019   ALBUMIN 4.6 09/03/2019   CALCIUM 10.0 09/03/2019   GFR 61.84 09/03/2019   Lab Results  Component Value Date   CHOL 186 09/03/2019   Lab Results  Component Value Date   HDL 88.50 09/03/2019   Lab Results  Component Value Date   LDLCALC 75 09/03/2019   Lab Results  Component Value Date   TRIG 115.0 09/03/2019   Lab Results  Component Value Date   CHOLHDL 2 09/03/2019   Lab Results  Component Value Date   HGBA1C 5.8 09/03/2019       Mammogram: Last completed on 10/23/2016. Results were normal. Repeat in 1 year. Pap Smear: Last completed on 10/23/2016.  Results were normal. Repeat in 3 years. Colonoscopy: Last completed on 06/19/2013. Results were normal. Repeat in 10 years. Dexa: Last completed on 01/22/2017. Results showed patient is osteopenic according to the [employer]. Repeat in 2 years.  Assessment & Plan:   Problem List Items Addressed This Visit     Hyperlipidemia    Encourage heart healthy diet such as MIND or DASH diet, increase exercise, avoid trans fats, simple carbohydrates and processed foods, consider a krill or fish or flaxseed oil cap daily.       Relevant Orders   CBC   Lipid panel   TSH   Hyperglycemia    hgba1c acceptable, minimize simple carbs. Increase exercise as tolerated.       Relevant Orders   Comprehensive metabolic panel   Lipid panel   Hemoglobin A1c   Cervical cancer screening   Relevant Orders    Cytology - PAP( [place])   Tachycardia    Mild asymptomatic      Atrophic vaginitis    Thinning noted of the labia majora. Will try premarin cream topically to area 2 x a week and reassess.       Preventative health care    Patient encouraged to maintain heart healthy diet, regular exercise, adequate sleep. Consider daily probiotics. Take medications as prescribed. Pap today, MGM due, last done 2018, Dexa in next 12 months, Colonscopy 2015 repeat in 2025. Declines Shingrix, has had 2 COVID shots      Left foot pain    Has been released by Dr Victorino Dike after surgical correction.       Other Visit Diagnoses     Encounter for screening for malignant neoplasm of breast, unspecified screening modality    -  Primary   Relevant Orders   MM 3D SCREEN BREAST BILATERAL   Estrogen deficiency       Relevant Orders   DG Bone Density   Post-menopausal       Relevant Orders   DG Bone Density   Closed trimalleolar fracture of left ankle, sequela       Relevant Orders   DG Bone Density       No orders of the defined types were placed in this encounter.   I,Zite Okoli,acting as a Neurosurgeon for Danise Edge, MD.,have documented all relevant documentation on the behalf of Danise Edge, MD,as directed by  Danise Edge, MD while in the presence of Danise Edge, MD.   I, Bradd Canary, MD, personally preformed the services described in this documentation.  All medical record entries made by the scribe were at my direction and in my presence.  I have reviewed the chart and discharge instructions (if applicable) and agree that the record reflects my personal performance and is accurate and complete. 09/19/2020

## 2020-09-19 NOTE — Patient Instructions (Signed)
Preventive Care 59-59 Years Old, Female Preventive care refers to lifestyle choices and visits with your health care provider that can promote health and wellness. This includes: A yearly physical exam. This is also called an annual wellness visit. Regular dental and eye exams. Immunizations. Screening for certain conditions. Healthy lifestyle choices, such as: Eating a healthy diet. Getting regular exercise. Not using drugs or products that contain nicotine and tobacco. Limiting alcohol use. What can I expect for my preventive care visit? Physical exam Your health care provider will check your: Height and weight. These may be used to calculate your BMI (body mass index). BMI is a measurement that tells if you are at a healthy weight. Heart rate and blood pressure. Body temperature. Skin for abnormal spots. Counseling Your health care provider may ask you questions about your: Past medical problems. Family's medical history. Alcohol, tobacco, and drug use. Emotional well-being. Home life and relationship well-being. Sexual activity. Diet, exercise, and sleep habits. Work and work Statistician. Access to firearms. Method of birth control. Menstrual cycle. Pregnancy history. What immunizations do I need?  Vaccines are usually given at various ages, according to a schedule. Your health care provider will recommend vaccines for you based on your age, medicalhistory, and lifestyle or other factors, such as travel or where you work. What tests do I need? Blood tests Lipid and cholesterol levels. These may be checked every 5 years, or more often if you are over 59 years old. Hepatitis C test. Hepatitis B test. Screening Lung cancer screening. You may have this screening every year starting at age 59 if you have a 30-pack-year history of smoking and currently smoke or have quit within the past 15 years. Colorectal cancer screening. All adults should have this screening starting at  age 59 and continuing until age 59. Your health care provider may recommend screening at age 59 if you are at increased risk. You will have tests every 1-10 years, depending on your results and the type of screening test. Diabetes screening. This is done by checking your blood sugar (glucose) after you have not eaten for a while (fasting). You may have this done every 1-3 years. Mammogram. This may be done every 1-2 years. Talk with your health care provider about when you should start having regular mammograms. This may depend on whether you have a family history of breast cancer. BRCA-related cancer screening. This may be done if you have a family history of breast, ovarian, tubal, or peritoneal cancers. Pelvic exam and Pap test. This may be done every 3 years starting at age 59. Starting at age 59, this may be done every 5 years if you have a Pap test in combination with an HPV test. Other tests STD (sexually transmitted disease) testing, if you are at risk. Bone density scan. This is done to screen for osteoporosis. You may have this scan if you are at high risk for osteoporosis. Talk with your health care provider about your test results, treatment options,and if necessary, the need for more tests. Follow these instructions at home: Eating and drinking  Eat a diet that includes fresh fruits and vegetables, whole grains, lean protein, and low-fat dairy products. Take vitamin and mineral supplements as recommended by your health care provider. Do not drink alcohol if: Your health care provider tells you not to drink. You are pregnant, may be pregnant, or are planning to become pregnant. If you drink alcohol: Limit how much you have to 0-1 drink a day. Be aware  of how much alcohol is in your drink. In the [place] one drink equals one 12 oz bottle of beer (355 mL), one 5 oz glass of wine (148 mL), or one 1 oz glass of hard liquor (44 mL).  Lifestyle Take daily care of your teeth and  gums. Brush your teeth every morning and night with fluoride toothpaste. Floss one time each day. Stay active. Exercise for at least 30 minutes 5 or more days each week. Do not use any products that contain nicotine or tobacco, such as cigarettes, e-cigarettes, and chewing tobacco. If you need help quitting, ask your health care provider. Do not use drugs. If you are sexually active, practice safe sex. Use a condom or other form of protection to prevent STIs (sexually transmitted infections). If you do not wish to become pregnant, use a form of birth control. If you plan to become pregnant, see your health care provider for a prepregnancy visit. If told by your health care provider, take low-dose aspirin daily starting at age 59. Find healthy ways to cope with stress, such as: Meditation, yoga, or listening to music. Journaling. Talking to a trusted person. Spending time with friends and family. Safety Always wear your seat belt while driving or riding in a vehicle. Do not drive: If you have been drinking alcohol. Do not ride with someone who has been drinking. When you are tired or distracted. While texting. Wear a helmet and other protective equipment during sports activities. If you have firearms in your house, make sure you follow all gun safety procedures. What's next? Visit your health care provider once a year for an annual wellness visit. Ask your health care provider how often you should have your eyes and teeth checked. Stay up to date on all vaccines. This information is not intended to replace advice given to you by your health care provider. Make sure you discuss any questions you have with your healthcare provider. Document Revised: 10/27/2019 Document Reviewed: 10/03/2017 Elsevier Patient Education  2022 [employer].

## 2020-09-20 LAB — CYTOLOGY - PAP: Diagnosis: NEGATIVE

## 2020-10-18 ENCOUNTER — Ambulatory Visit (HOSPITAL_BASED_OUTPATIENT_CLINIC_OR_DEPARTMENT_OTHER)
Admission: RE | Admit: 2020-10-18 | Discharge: 2020-10-18 | Disposition: A | Discharge status: Home / Self Care | Payer: 59 | Source: Ambulatory Visit | Attending: Family Medicine | Admitting: Family Medicine

## 2020-10-18 ENCOUNTER — Encounter (HOSPITAL_BASED_OUTPATIENT_CLINIC_OR_DEPARTMENT_OTHER): Payer: Self-pay

## 2020-10-18 ENCOUNTER — Other Ambulatory Visit: Payer: Self-pay

## 2020-10-18 DIAGNOSIS — Z780 Asymptomatic menopausal state: Secondary | ICD-10-CM | POA: Diagnosis present

## 2020-10-18 DIAGNOSIS — E2839 Other primary ovarian failure: Secondary | ICD-10-CM | POA: Diagnosis not present

## 2020-10-18 DIAGNOSIS — Z1239 Encounter for other screening for malignant neoplasm of breast: Secondary | ICD-10-CM | POA: Diagnosis present

## 2020-10-18 DIAGNOSIS — S82852S Displaced trimalleolar fracture of left lower leg, sequela: Secondary | ICD-10-CM | POA: Insufficient documentation

## 2020-11-10 ENCOUNTER — Other Ambulatory Visit: Payer: Self-pay | Admitting: Family Medicine

## 2020-11-11 ENCOUNTER — Other Ambulatory Visit: Payer: Self-pay | Admitting: Family Medicine

## 2021-01-04 ENCOUNTER — Telehealth: Payer: Self-pay | Admitting: Family Medicine

## 2021-01-04 NOTE — Telephone Encounter (Signed)
Pt. Called and stated she found the cream that she discussed with Dr.Blyth. She says it has never been opened but expired in 2019. She wants to know is it safe to use of should she get a new rx.  conjugated estrogens (PREMARIN) vaginal cream

## 2021-01-05 ENCOUNTER — Other Ambulatory Visit: Payer: Self-pay

## 2021-01-05 MED ORDER — PREMARIN 0.625 MG/GM VA CREA: 1.0000 | TOPICAL_CREAM | Freq: Every day | VAGINAL | 6 refills | Status: DC

## 2021-01-23 ENCOUNTER — Telehealth: Payer: Self-pay | Admitting: Family Medicine

## 2021-01-23 NOTE — Telephone Encounter (Signed)
Pt states pharmacy told her there was a problem with her insurance and she needed to contact her pcp to see if this could get fixed.    Medication: conjugated estrogens (PREMARIN) vaginal cream  Preferred Pharmacy: [facility] [id number] [place] - [address]  [address] [place] [address]  Phone:  [phone]  Fax:  [phone]

## 2021-01-25 NOTE — Telephone Encounter (Signed)
Spoke and she will see if we need a prior auth or needs to switch medication

## 2021-02-01 ENCOUNTER — Other Ambulatory Visit: Payer: Self-pay

## 2021-02-01 MED ORDER — PREMARIN 0.625 MG/GM VA CREA: 1.0000 | TOPICAL_CREAM | Freq: Every day | VAGINAL | 12 refills | Status: DC

## 2021-02-02 ENCOUNTER — Telehealth: Payer: Self-pay

## 2021-02-02 NOTE — Telephone Encounter (Signed)
Patient called and stated she has not tried the cream before.

## 2021-02-02 NOTE — Telephone Encounter (Signed)
Called pt. Lvm to see if she tried estradiol vaginal cream 0.01% before.

## 2021-02-03 ENCOUNTER — Other Ambulatory Visit: Payer: Self-pay

## 2021-02-03 ENCOUNTER — Telehealth: Payer: Self-pay | Admitting: Family Medicine

## 2021-02-03 MED ORDER — ESTRADIOL 0.1 MG/GM VA CREA: 1.0000 | TOPICAL_CREAM | Freq: Every day | VAGINAL | 12 refills | Status: DC

## 2021-02-03 NOTE — Telephone Encounter (Signed)
Pharmacy called and stated they need to know amount of grams that need to be applied in applicator.  estradiol (ESTRACE VAGINAL) 0.1 MG/GM vaginal cream   [id number] - [place], [place] - [address]  [address] [place] [address]  Phone:  [phone]  Fax:  [phone]

## 2021-02-03 NOTE — Telephone Encounter (Signed)
Spoke with insurance and they told me that the estrace cream is covered under her insurance and I spoke with pt to let her know that, she was fine it. I sent in the estrace cream to pharmacy.

## 2021-02-03 NOTE — Telephone Encounter (Signed)
done

## 2021-03-31 ENCOUNTER — Telehealth: Payer: Self-pay | Admitting: Family Medicine

## 2021-03-31 ENCOUNTER — Encounter: Payer: Self-pay | Admitting: Family

## 2021-03-31 ENCOUNTER — Ambulatory Visit (INDEPENDENT_AMBULATORY_CARE_PROVIDER_SITE_OTHER): Payer: 59 | Admitting: Family

## 2021-03-31 VITALS — BP 150/84 | HR 97 | Temp 98.9°F | Resp 16 | Ht 64.5 in | Wt 159.0 lb

## 2021-03-31 DIAGNOSIS — L989 Disorder of the skin and subcutaneous tissue, unspecified: Secondary | ICD-10-CM

## 2021-03-31 DIAGNOSIS — R0789 Other chest pain: Secondary | ICD-10-CM | POA: Diagnosis not present

## 2021-03-31 HISTORY — DX: Disorder of the skin and subcutaneous tissue, unspecified: L98.9

## 2021-03-31 NOTE — Assessment & Plan Note (Signed)
Pt will schedule appointment for shave biopsy.

## 2021-03-31 NOTE — Telephone Encounter (Signed)
Nurse Assessment Nurse: Vaughan Browner, RN, Marchelle Folks Date/Time (Eastern Time): 03/31/2021 11:15:28 AM Confirm and document reason for call. If symptomatic, describe symptoms. ---caller states she has chest tightness and twinge pains. has had it before in Jan. did it for 3 days and got better. no shortness of breath Does the patient have any new or worsening symptoms? ---Yes Will a triage be completed? ---Yes Related visit to physician within the last 2 weeks? ---No Does the PT have any chronic conditions? (i.e. diabetes, asthma, this includes High risk factors for pregnancy, etc.) ---No Is this a behavioral health or substance abuse call? ---No Guidelines Guideline Title Affirmed Question Affirmed Notes Nurse Date/Time Lamount Cohen Time) Chest Pain [1] Chest pain(s) lasting a few seconds AND [2] persists > 3 days [place], RN, Marchelle Folks 03/31/2021 11:16:44 AM Disp. Time Lamount Cohen Time) Disposition Final User 03/31/2021 11:12:27 AM Send to Urgent Lyndel Safe, Martie Lee 03/31/2021 11:22:54 AM SEE PCP WITHIN 3 DAYS Yes [place], RN, Marchelle Folks PLEASE NOTE: All timestamps contained within this report are represented as [place] Standard Time. CONFIDENTIALTY NOTICE: This fax transmission is intended only for the addressee. It contains information that is legally privileged, confidential or otherwise protected from use or disclosure. If you are not the intended recipient, you are strictly prohibited from reviewing, disclosing, copying using or disseminating any of this information or taking any action in reliance on or regarding this information. If you have received this fax in error, please notify us immediately by telephone so that we can arrange for its return to [place]. Phone: [phone], Toll-Free: [phone], Fax: [phone] Page: 2 of 2 Call Id: [id number] Caller Disagree/Comply Comply Caller Understands Yes PreDisposition Did not know what to do Care Advice Given Per Guideline SEE PCP WITHIN 3  DAYS: * You need to be seen within 2 or 3 days. * PCP VISIT: Call your doctor (or NP/PA) during regular office hours and make an appointment. A clinic or urgent care center are good places to go for care if your doctor's office is closed or you can't get an appointment. NOTE: If office will be open tomorrow, tell caller to call then, not in 3 days. CARE ADVICE given per Chest Pain (Adult) guideline. * You become worse CALL BACK IF: * Chest pain increases in frequency, duration or severity * Chest pain lasts over 5 minutes * Difficulty breathing or unusual sweating occurs * Fever over 100.4 F (38.0 C) Comments User: Jaclynn Major, RN Date/Time Lamount Cohen Time): 03/31/2021 11:18:03 AM bp is 144/89 and HR 97 Referrals REFERRED TO PCP OFFICE  Appt scheduled w/ Efraim Kaufmann

## 2021-03-31 NOTE — Assessment & Plan Note (Signed)
New. My suspicion is that her pain is radiating around from her thoracic spine.  EKG tracing is personally reviewed.  EKG notes NSR.  No acute changes.  Given her family hx of CAD and c/o intermittent sob, will go ahead and send her to cardiology for consultation. She is instructed to go to the ER if she develops worsening CP/SOB or if symptoms become persistent. Pt verbalizes understanding.

## 2021-03-31 NOTE — Progress Notes (Signed)
**Note Maria-Identified via Obfuscation** Subjective:     Patient ID: Maria Lucas, female    DOB: 1960/12/06, 61 y.o.   MRN: [id number]  Chief Complaint  Patient presents with   Chest Pain    Patient complains of "occasional chest pain", denies SOB   Skin lesion    Complains of new skin discoloration, left inner groin and left leg    Chest Pain   Patient is in today for c/o atypical chest pain. Reports a sharp shooting pain left substernal region. Pain is fleeting.  Had about 3 days In January which was more frequent. Tends to occur at rest.  Chest feels tight sometimes but does not correlate always at pain. Chest tightness also occurs at rest.  Does not associated the pain or tightness with anxiety. She has a strong family hx of premature CAD.  She does report some left back soreness in the sub scapular area  She is also concerned about 2 skin lesions that are new.   Health Maintenance Due  Topic Date Due   COVID-19 Vaccine (3 - Booster for [employer] series) 12/08/2019    Past Medical History:  Diagnosis Date   Anemia    menorraghia   Breast mass, left 03/21/2011   Complication of anesthesia    slow to wake up   Elevated antinuclear antibody (ANA) level 03/12/2011   Glaucoma 03/12/2011   Heart murmur    Hyperlipidemia 03/12/2011   Palpitations    Refusal of blood transfusions as patient is Jehovah's Witness 10/12/2014    Past Surgical History:  Procedure Laterality Date   LACERATION REPAIR     age 33, dog attack, stitches in face, multipe sites right side of face   ORIF ANKLE FRACTURE Left 02/18/2020   Procedure: OPEN REDUCTION INTERNAL FIXATION (ORIF) Left trimalleolar fracture,  syndesmosis repair;  Surgeon: Toni Arthurs, MD;  Location: [facility];  Service: Orthopedics;  Laterality: Left;     Family History  Problem Relation Age of Onset   Cancer Mother        skin, bcc, scc   Heart disease Mother        arrythmia   Hyperlipidemia Mother    Melanoma Mother    Osteoporosis Mother     Heart attack Father 34       smoker/drinker   Hypertension Father    Hyperlipidemia Father    Heart disease Father 68       MI   Varicose Veins Father    Hypertension Sister    Ovarian cysts Sister    Hyperlipidemia Sister    Heart attack Brother 44   Hypertension Brother    Heart disease Brother    Hyperlipidemia Brother    Heart attack Maternal Grandmother    Hypertension Maternal Grandmother        valvular heart disease   Heart disease Maternal Grandmother    Vascular Disease Maternal Grandmother    Cancer Maternal Grandfather        skin, bcc   Glaucoma Maternal Grandfather    Stroke Paternal Grandfather    Anxiety disorder Daughter    OCD Daughter    Colon cancer Neg Hx     Social History   Socioeconomic History   Marital status: Married    Spouse name: Not on file   Number of children: Not on file   Years of education: Not on file   Highest education level: Not on file  Occupational History   Not on file  Tobacco Use  Smoking status: Never   Smokeless tobacco: Never  Substance and Sexual Activity   Alcohol use: No   Drug use: No   Sexual activity: Yes    Partners: Male  Other Topics Concern   Not on file  Social History Narrative   Not on file   Social Determinants of Health   Financial Resource Strain: Not on file  Food Insecurity: Not on file  Transportation Needs: Not on file  Physical Activity: Not on file  Stress: Not on file  Social Connections: Not on file  Intimate Partner Violence: Not on file    Outpatient Medications Prior to Visit  Medication Sig Dispense Refill   aspirin 81 MG tablet Take 81 mg by mouth daily.     atorvastatin (LIPITOR) 10 MG tablet TAKE ONE TABLET BY MOUTH DAILY AT BEDTIME 90 tablet 1   Calcium-Magnesium-Vitamin D (CITRACAL CALCIUM+D PO) Take by mouth daily.     Cholecalciferol (VITAMIN D) 2000 units CAPS Take 1 capsule (2,000 Units total) by mouth daily. 30 capsule    Coenzyme Q10 10 MG capsule Take 1 capsule  by mouth daily.     Flaxseed Oil OIL Take 1,400 mg by mouth daily.     IRON, FERROUS GLUCONATE, PO Take 65 mg by mouth.     latanoprost (XALATAN) 0.005 % ophthalmic solution Place 1 drop into both eyes at bedtime.     multivitamin-iron-minerals-folic acid (CENTRUM) chewable tablet Chew 1 tablet by mouth daily.     Omega-3 350 MG CPDR Take by mouth.     Probiotic Product (PROBIOTIC DAILY PO) Take 2 each by mouth.     estradiol (ESTRACE VAGINAL) 0.1 MG/GM vaginal cream Place 1 Applicatorful vaginally at bedtime. (Patient not taking: Reported on 03/31/2021) 42.5 g 12   fluticasone (FLONASE) 50 MCG/ACT nasal spray Place 2 sprays into both nostrils daily. (Patient not taking: Reported on 09/19/2020) 16 g 6   loratadine-pseudoephedrine (CLARITIN-D 24-HOUR) 10-240 MG 24 hr tablet Take 1 tablet by mouth daily. (Patient not taking: Reported on 09/19/2020)     No facility-administered medications prior to visit.    Allergies  Allergen Reactions   Penicillins Rash   Clindamycin Hcl Rash    Review of Systems  Cardiovascular:  Positive for chest pain.      Objective:    Physical Exam Constitutional:      General: She is not in acute distress.    Appearance: Normal appearance. She is well-developed.  HENT:     Head: Normocephalic and atraumatic.     Right Ear: External ear normal.     Left Ear: External ear normal.  Eyes:     General: No scleral icterus. Neck:     Thyroid: No thyromegaly.  Cardiovascular:     Rate and Rhythm: Normal rate and regular rhythm.     Heart sounds: Normal heart sounds. No murmur heard. Pulmonary:     Effort: Pulmonary effort is normal. No respiratory distress.     Breath sounds: Normal breath sounds. No wheezing.  Musculoskeletal:     Cervical back: Neck supple.  Skin:    General: Skin is warm and dry.     Comments: Dry raised lesion left shin Irregular wart-like lesion noted left lower groin  Neurological:     Mental Status: She is alert and oriented to  person, place, and time.  Psychiatric:        Mood and Affect: Mood normal.        Behavior: Behavior normal.  Thought Content: Thought content normal.        Judgment: Judgment normal.    BP (!) 150/84    Pulse 97    Temp 98.9 F (37.2 C) (Oral)    Resp 16    Ht 5' 4.5" (1.638 m)    Wt 159 lb (72.1 kg)    LMP 11/07/2012    SpO2 100%    BMI 26.87 kg/m  Wt Readings from Last 3 Encounters:  03/31/21 159 lb (72.1 kg)  09/19/20 157 lb 9.6 oz (71.5 kg)  02/18/20 158 lb 8.2 oz (71.9 kg)       Assessment & Plan:   Problem List Items Addressed This Visit       Unprioritized   Skin lesion    Pt will schedule appointment for shave biopsy.       Atypical chest pain - Primary    New. My suspicion is that her pain is radiating around from her thoracic spine.  EKG tracing is personally reviewed.  EKG notes NSR.  No acute changes.  Given her family hx of CAD and c/o intermittent sob, will go ahead and send her to cardiology for consultation. She is instructed to go to the ER if she develops worsening CP/SOB or if symptoms become persistent. Pt verbalizes understanding.       Relevant Orders   EKG 12-Lead (Completed)   Ambulatory referral to Cardiology    I have discontinued Maria Lucas's loratadine-pseudoephedrine, fluticasone, and estradiol. I am also having her maintain her Flaxseed Oil, multivitamin-iron-minerals-folic acid, Calcium-Magnesium-Vitamin D (CITRACAL CALCIUM+D PO), latanoprost, aspirin, Omega-3, (IRON, FERROUS GLUCONATE, PO), Probiotic Product (PROBIOTIC DAILY PO), Vitamin D, Coenzyme Q10, and atorvastatin.  No orders of the defined types were placed in this encounter.

## 2021-03-31 NOTE — Telephone Encounter (Signed)
Pt stated she was having chest tightness and "twinges" in her chest, tried to ask pt if that felt like an irregular heartbeat , or skipping a beat but she was unsure. She stated her pulse and bp was slightly elevated. She stated she has had this happen before. Pt was transferred to triage.

## 2021-04-17 ENCOUNTER — Ambulatory Visit (INDEPENDENT_AMBULATORY_CARE_PROVIDER_SITE_OTHER): Payer: 59 | Admitting: Family

## 2021-04-17 ENCOUNTER — Telehealth: Payer: Self-pay | Admitting: Family

## 2021-04-17 VITALS — BP 155/73 | HR 92 | Temp 98.7°F | Resp 16 | Wt 156.0 lb

## 2021-04-17 DIAGNOSIS — L989 Disorder of the skin and subcutaneous tissue, unspecified: Secondary | ICD-10-CM

## 2021-04-17 DIAGNOSIS — L820 Inflamed seborrheic keratosis: Secondary | ICD-10-CM

## 2021-04-17 NOTE — Telephone Encounter (Signed)
See mychart.  

## 2021-04-17 NOTE — Progress Notes (Addendum)
**Note Maria-Identified via Obfuscation** Subjective:   By signing my name below, I, Zite Okoli, attest that this documentation has been prepared under the direction and in the presence of Sandford CrazeO'Sullivan, Jailyne Chieffo, NP 04/17/2021       Patient ID: Maria Lucas, female    DOB: 12/09/1960, 61 y.o.   MRN: [id number]  No chief complaint on file.   HPI Patient is in today for an office visit and 2 week f/u  Mole Removal- She has a mole near her left bikini area she would removed. It is not bothersome at this time but is uncomfortable and rubbing against her underwear.   Past Medical History:  Diagnosis Date   Anemia    menorraghia   Breast mass, left 03/21/2011   Complication of anesthesia    slow to wake up   Elevated antinuclear antibody (ANA) level 03/12/2011   Glaucoma 03/12/2011   Heart murmur    Hyperlipidemia 03/12/2011   Palpitations    Refusal of blood transfusions as patient is Jehovah's Witness 10/12/2014    Past Surgical History:  Procedure Laterality Date   LACERATION REPAIR     age 61, dog attack, stitches in face, multipe sites right side of face   ORIF ANKLE FRACTURE Left 02/18/2020   Procedure: OPEN REDUCTION INTERNAL FIXATION (ORIF) Left trimalleolar fracture,  syndesmosis repair;  Surgeon: Toni ArthursHewitt, John, MD;  Location: [facility];  Service: Orthopedics;  Laterality: Left;  75min    Family History  Problem Relation Age of Onset   Cancer Mother        skin, bcc, scc   Heart disease Mother        arrythmia   Hyperlipidemia Mother    Melanoma Mother    Osteoporosis Mother    Heart attack Father [id number]       smoker/drinker   Hypertension Father    Hyperlipidemia Father    Heart disease Father [phone]       MI   Varicose Veins Father    Hypertension Sister    Ovarian cysts Sister    Hyperlipidemia Sister    Heart attack Brother [phone]   Hypertension Brother    Heart disease Brother    Hyperlipidemia Brother    Heart attack Maternal Grandmother    Hypertension Maternal Grandmother        valvular  heart disease   Heart disease Maternal Grandmother    Vascular Disease Maternal Grandmother    Cancer Maternal Grandfather        skin, bcc   Glaucoma Maternal Grandfather    Stroke Paternal Grandfather    Anxiety disorder Daughter    OCD Daughter    Colon cancer Neg Hx     Social History   Socioeconomic History   Marital status: Married    Spouse name: Not on file   Number of children: Not on file   Years of education: Not on file   Highest education level: Not on file  Occupational History   Not on file  Tobacco Use   Smoking status: Never   Smokeless tobacco: Never  Substance and Sexual Activity   Alcohol use: No   Drug use: No   Sexual activity: Yes    Partners: Male  Other Topics Concern   Not on file  Social History Narrative   Not on file   Social Determinants of Health   Financial Resource Strain: Not on file  Food Insecurity: Not on file  Transportation Needs: Not on file  Physical Activity: Not on  file  Stress: Not on file  Social Connections: Not on file  Intimate Partner Violence: Not on file    Outpatient Medications Prior to Visit  Medication Sig Dispense Refill   aspirin 81 MG tablet Take 81 mg by mouth daily.     atorvastatin (LIPITOR) 10 MG tablet TAKE ONE TABLET BY MOUTH DAILY AT BEDTIME 90 tablet 1   Calcium-Magnesium-Vitamin D (CITRACAL CALCIUM+D PO) Take by mouth daily.     Cholecalciferol (VITAMIN D) 2000 units CAPS Take 1 capsule (2,000 Units total) by mouth daily. 30 capsule    Coenzyme Q10 10 MG capsule Take 1 capsule by mouth daily.     Flaxseed Oil OIL Take 1,400 mg by mouth daily.     IRON, FERROUS GLUCONATE, PO Take 65 mg by mouth.     latanoprost (XALATAN) 0.005 % ophthalmic solution Place 1 drop into both eyes at bedtime.     multivitamin-iron-minerals-folic acid (CENTRUM) chewable tablet Chew 1 tablet by mouth daily.     Omega-3 350 MG CPDR Take by mouth.     Probiotic Product (PROBIOTIC DAILY PO) Take 2 each by mouth.     No  facility-administered medications prior to visit.    Allergies  Allergen Reactions   Penicillins Rash   Clindamycin Hcl Rash    Review of Systems  Constitutional:  Negative for fever.  HENT:  Negative for ear pain and hearing loss.        (-)nystagmus (-)adenopathy  Eyes:  Negative for blurred vision.  Respiratory:  Negative for cough, shortness of breath and wheezing.   Cardiovascular:  Negative for chest pain and leg swelling.  Gastrointestinal:  Negative for blood in stool, diarrhea, nausea and vomiting.  Genitourinary:  Negative for dysuria and frequency.  Musculoskeletal:  Negative for joint pain and myalgias.  Skin:  Negative for rash.  Neurological:  Negative for headaches.  Psychiatric/Behavioral:  Negative for depression. The patient is not nervous/anxious.       Objective:    Physical Exam Constitutional:      General: She is not in acute distress.    Appearance: Normal appearance. She is not ill-appearing.  HENT:     Head: Normocephalic and atraumatic.     Right Ear: External ear normal.  Eyes:     Extraocular Movements: Extraocular movements intact.     Pupils: Pupils are equal, round, and reactive to light.  Cardiovascular:     Rate and Rhythm: Normal rate.  Pulmonary:     Effort: Pulmonary effort is normal.  Musculoskeletal:     Cervical back: Neck supple.  Skin:    General: Skin is warm and dry.     Comments: + tag like lesion noted left groin near panty line  Neurological:     Mental Status: She is alert and oriented to person, place, and time.  Psychiatric:        Behavior: Behavior normal.        Judgment: Judgment normal.    BP (!) 155/73 (BP Location: Left Arm, Patient Position: Sitting, Cuff Size: Small)    Pulse 92    Temp 98.7 F (37.1 C) (Oral)    Resp 16    Wt 156 lb (70.8 kg)    LMP 11/07/2012    SpO2 98%    BMI 26.36 kg/m  Wt Readings from Last 3 Encounters:  04/17/21 156 lb (70.8 kg)  03/31/21 159 lb (72.1 kg)  09/19/20 157 lb 9.6  oz (71.5 kg)    Diabetic  Foot Exam - Simple   No data filed    Lab Results  Component Value Date   WBC 5.9 09/19/2020   HGB 13.4 09/19/2020   HCT 40.1 09/19/2020   PLT 194.0 09/19/2020   GLUCOSE 99 09/19/2020   CHOL 202 (H) 09/19/2020   TRIG 137.0 09/19/2020   HDL 85.20 09/19/2020   LDLDIRECT 122.7 03/26/2012   LDLCALC 90 09/19/2020   ALT 19 09/19/2020   AST 22 09/19/2020   NA 141 09/19/2020   K 4.2 09/19/2020   CL 102 09/19/2020   CREATININE 0.83 09/19/2020   BUN 14 09/19/2020   CO2 30 09/19/2020   TSH 2.09 09/19/2020   HGBA1C 6.0 09/19/2020    Lab Results  Component Value Date   TSH 2.09 09/19/2020   Lab Results  Component Value Date   WBC 5.9 09/19/2020   HGB 13.4 09/19/2020   HCT 40.1 09/19/2020   MCV 93.2 09/19/2020   PLT 194.0 09/19/2020   Lab Results  Component Value Date   NA 141 09/19/2020   K 4.2 09/19/2020   CO2 30 09/19/2020   GLUCOSE 99 09/19/2020   BUN 14 09/19/2020   CREATININE 0.83 09/19/2020   BILITOT 0.6 09/19/2020   ALKPHOS 102 09/19/2020   AST 22 09/19/2020   ALT 19 09/19/2020   PROT 7.1 09/19/2020   ALBUMIN 4.7 09/19/2020   CALCIUM 10.0 09/19/2020   GFR 77.16 09/19/2020   Lab Results  Component Value Date   CHOL 202 (H) 09/19/2020   Lab Results  Component Value Date   HDL 85.20 09/19/2020   Lab Results  Component Value Date   LDLCALC 90 09/19/2020   Lab Results  Component Value Date   TRIG 137.0 09/19/2020   Lab Results  Component Value Date   CHOLHDL 2 09/19/2020   Lab Results  Component Value Date   HGBA1C 6.0 09/19/2020       Assessment & Plan:   Problem List Items Addressed This Visit       Unprioritized   Skin lesion - Primary    Procedure including risks/benefits explained to patient.  Questions were answered. After informed consent was obtained and a time out completed, site was cleansed with betadine and then alcohol. 1% Lidocaine with epinephrine was injected under lesion and then shave biopsy  was performed. Area was cauterized to obtain hemostasis.  Pt tolerated procedure well.  Specimen sent for pathology review.  Pt instructed to keep the area dry for 24 hours and to contact us if he develops redness, drainage or swelling at the site.  Pt may use tylenol as needed for discomfort today.         Relevant Orders   Surgical pathology( [facility]/ [facility])     No orders of the defined types were placed in this encounter.   I,Zite Okoli,acting as a Neurosurgeon for [employer], NP.,have documented all relevant documentation on the behalf of Lemont Fillers, NP,as directed by  Lemont Fillers, NP while in the presence of Lemont Fillers, NP.   I, Sandford Craze, NP, personally preformed the services described in this documentation.  All medical record entries made by the scribe were at my direction and in my presence.  I have reviewed the chart and discharge instructions (if applicable) and agree that the record reflects my personal performance and is accurate and complete. 04/17/2021

## 2021-04-17 NOTE — Assessment & Plan Note (Signed)
Procedure including risks/benefits explained to patient.  Questions were answered. After informed consent was obtained and a time out completed, site was cleansed with betadine and then alcohol. 1% Lidocaine with epinephrine was injected under lesion and then shave biopsy was performed. Area was cauterized to obtain hemostasis.  Pt tolerated procedure well.  Specimen sent for pathology review.  Pt instructed to keep the area dry for 24 hours and to contact us if he develops redness, drainage or swelling at the site.  Pt may use tylenol as needed for discomfort today.    

## 2021-05-03 ENCOUNTER — Other Ambulatory Visit: Payer: Self-pay

## 2021-05-03 DIAGNOSIS — R011 Cardiac murmur, unspecified: Secondary | ICD-10-CM | POA: Insufficient documentation

## 2021-05-03 DIAGNOSIS — T8859XA Other complications of anesthesia, initial encounter: Secondary | ICD-10-CM | POA: Insufficient documentation

## 2021-05-04 ENCOUNTER — Ambulatory Visit: Payer: 59 | Admitting: Cardiology

## 2021-05-04 ENCOUNTER — Encounter: Payer: Self-pay | Admitting: Cardiology

## 2021-05-04 ENCOUNTER — Ambulatory Visit (INDEPENDENT_AMBULATORY_CARE_PROVIDER_SITE_OTHER): Payer: 59

## 2021-05-04 VITALS — BP 146/80 | HR 88 | Ht 64.6 in | Wt 160.0 lb

## 2021-05-04 DIAGNOSIS — R002 Palpitations: Secondary | ICD-10-CM

## 2021-05-04 DIAGNOSIS — E782 Mixed hyperlipidemia: Secondary | ICD-10-CM | POA: Diagnosis not present

## 2021-05-04 DIAGNOSIS — R011 Cardiac murmur, unspecified: Secondary | ICD-10-CM

## 2021-05-04 NOTE — Progress Notes (Signed)
?Cardiology Office Note:   ? ?Date:  05/04/2021  ? ?ID:  [name], DOB 08/08/1960, MRN [id number] ? ?PCP:  Bradd Canary, MD  ?Cardiologist:  Garwin Brothers, MD  ? ?Referring MD: Sandford Craze, NP  ? ? ?ASSESSMENT:   ? ?1. Mixed hyperlipidemia   ?2. Palpitations   ?3. Cardiac murmur   ? ?PLAN:   ? ?In order of problems listed above: ? ?Primary prevention stressed with the patient.  Importance of compliance with diet and medication stressed and she vocalized understanding.  She was advised to continue regular exercise and walking which she does at least half an hour a day 5 days a week. ?Elevated blood pressure without a diagnosis of hypertension: She was advised to track blood pressures at home.  Diet including salt intake issues were discussed lifestyle modification urged and she understood.  Questions were answered to her satisfaction. ?Cardiac murmur: Echocardiogram will be done to assess murmur heard on auscultation. ?Palpitations: Appear to be benign.  We will do a 2-week monitoring. I also educated her about a cardia app for the future. ?She will be seen in follow-up appointment on a as needed basis or earlier if she has any concerns.  Questions were answered to her satisfaction. ? ? ?Medication Adjustments/Labs and Tests Ordered: ?Current medicines are reviewed at length with the patient today.  Concerns regarding medicines are outlined above.  ?No orders of the defined types were placed in this encounter. ? ?No orders of the defined types were placed in this encounter. ? ? ? ?History of Present Illness:   ? ?Maria Lucas is a 61 y.o. female who is being seen today for the evaluation of palpitations at the request of Sandford Craze, NP.  Patient is a pleasant 61 year old female.  She has past medical history of mixed dyslipidemia.  She is an active lady.  She tells me that she walks about a mile a day without any problem.  She has been noticing some skipped beat sensation and palpitations  like feeling for which she was concerned and referred here.  Denies any chest pain orthopnea or PND.  At the time of my evaluation, the patient is alert awake oriented and in no distress. ? ?Past Medical History:  ?Diagnosis Date  ? Acid reflux 10/29/2017  ? Allergy 04/12/2011  ? Anemia   ? menorraghia  ? Atrophic vaginitis 10/12/2013  ? Atypical chest pain 03/12/2011  ? Breast mass, left 03/21/2011  ? Cervical cancer screening 03/21/2011  ? Menarche at 17, 5-10 day cycles, variable flow LMP 03/06/11 G1P1 s/p SVD Fibrocystic breast by [facility] in past, last MGM 3 years ago. H/o endometrial cells in a woman over 40 once, no there abnormal paps Last pap and MGM 3 years ago.  ? Closed trimalleolar fracture of left ankle 02/10/2020  ? Complication of anesthesia   ? slow to wake up  ? Elevated antinuclear antibody (ANA) level 03/12/2011  ? Elevated BP 03/12/2011  ? Patient reports 115-130/60-80 when she checks it elsewhere She believes it is secondary to a [place] phenomenon  ? Glaucoma 03/12/2011  ? Heart murmur   ? History of gestational diabetes 08/18/2018  ? Hyperglycemia 03/12/2011  ? H/o gestational DM, diet controlled  ? Hyperlipidemia 03/12/2011  ? Left foot pain 01/10/2017  ? Trimalleoloar fracture in early 2022 now with screw, wire and plate  ? Osteopenia of lumbar spine 08/18/2018  ? Palpitations   ? Perennial allergic rhinitis 08/18/2018  ? Last Assessment & Plan:  Formatting of this note might be different from the original. Symptoms controlled with daily loratadine.  She feels more chest tightness lately related to being outdoors more, she has no exertional symptoms when going for exercise. Presently symptom-free, consider additional medication or changing out antihistamines for better control if needed.  For now we will continue l  ? Perimenopausal 10/05/2012  ? Last menses October 2014  ? Preventative health care 10/12/2013  ? Refusal of blood transfusions as patient is Jehovah's Witness 10/12/2014  ? Skin lesion 03/31/2021  ? SOM (serous  otitis media) 04/12/2011  ? Sun-damaged skin 10/24/2016  ? Tachycardia 03/21/2011  ? Varicose veins of lower extremities with other complications 09/07/2013  ? Left leg, has been evaluated by vascular surgery in past.   ? ? ?Past Surgical History:  ?Procedure Laterality Date  ? LACERATION REPAIR    ? age 58, dog attack, stitches in face, multipe sites right side of face  ? ORIF ANKLE FRACTURE Left 02/18/2020  ? Procedure: OPEN REDUCTION INTERNAL FIXATION (ORIF) Left trimalleolar fracture,  syndesmosis repair;  Surgeon: Toni Arthurs, MD;  Location: [facility];  Service: Orthopedics;  Laterality: Left;   ? ? ?Current Medications: ?Current Meds  ?Medication Sig  ? aspirin 81 MG tablet Take 81 mg by mouth daily.  ? atorvastatin (LIPITOR) 10 MG tablet TAKE ONE TABLET BY MOUTH DAILY AT BEDTIME  ? Calcium-Magnesium-Vitamin D (CITRACAL CALCIUM+D PO) Take 1 tablet by mouth daily.  ? Cholecalciferol (VITAMIN D) 2000 units CAPS Take 1 capsule (2,000 Units total) by mouth daily.  ? Coenzyme Q10 10 MG capsule Take 1 capsule by mouth daily.  ? Flaxseed Oil OIL Take 1,400 mg by mouth daily.  ? IRON, FERROUS GLUCONATE, PO Take 65 mg by mouth daily.  ? latanoprost (XALATAN) 0.005 % ophthalmic solution Place 1 drop into both eyes at bedtime.  ? multivitamin-iron-minerals-folic acid (CENTRUM) chewable tablet Chew 1 tablet by mouth daily.  ? Omega-3 350 MG CPDR Take 1 tablet by mouth daily.  ? Probiotic Product (PROBIOTIC DAILY PO) Take 1 each by mouth daily.  ?  ? ?Allergies:   Penicillins and Clindamycin hcl  ? ?Social History  ? ?Socioeconomic History  ? Marital status: Married  ?  Spouse name: Not on file  ? Number of children: Not on file  ? Years of education: Not on file  ? Highest education level: Not on file  ?Occupational History  ? Not on file  ?Tobacco Use  ? Smoking status: Never  ? Smokeless tobacco: Never  ?Substance and Sexual Activity  ? Alcohol use: No  ? Drug use: No  ? Sexual activity: Yes  ?   Partners: Male  ?Other Topics Concern  ? Not on file  ?Social History Narrative  ? Not on file  ? ?Social Determinants of Health  ? ?Financial Resource Strain: Not on file  ?Food Insecurity: Not on file  ?Transportation Needs: Not on file  ?Physical Activity: Not on file  ?Stress: Not on file  ?Social Connections: Not on file  ?  ? ?Family History: ?The patient's family history includes Anxiety disorder in her daughter; Cancer in her maternal grandfather and mother; Glaucoma in her maternal grandfather; Heart attack in her maternal grandmother; Heart attack (age of onset: 74) in her brother; Heart attack (age of onset: 68) in her father; Heart disease in her brother, maternal grandmother, and mother; Heart disease (age of onset: 61) in her father; Hyperlipidemia in her brother, father, mother, and  sister; Hypertension in her brother, father, maternal grandmother, and sister; Melanoma in her mother; OCD in her daughter; Osteoporosis in her mother; Ovarian cysts in her sister; Stroke in her paternal grandfather; Varicose Veins in her father; Vascular Disease in her maternal grandmother. There is no history of Colon cancer. ? ?ROS:   ?Please see the history of present illness.    ?All other systems reviewed and are negative. ? ?EKGs/Labs/Other Studies Reviewed:   ? ?The following studies were reviewed today: ?I discussed my findings with the patient at length.  EKG reveals sinus rhythm and nonspecific ST-T changes ? ? ?Recent Labs: ?09/19/2020: ALT 19; BUN 14; Creatinine, Ser 0.83; Hemoglobin 13.4; Platelets 194.0; Potassium 4.2; Sodium 141; TSH 2.09  ?Recent Lipid Panel ?   ?Component Value Date/Time  ? CHOL 202 (H) 09/19/2020 0940  ? TRIG 137.0 09/19/2020 0940  ? HDL 85.20 09/19/2020 0940  ? CHOLHDL 2 09/19/2020 0940  ? VLDL 27.4 09/19/2020 0940  ? LDLCALC 90 09/19/2020 0940  ? LDLDIRECT 122.7 03/26/2012 0858  ? ? ?Physical Exam:   ? ?VS:  BP (!) 146/80   Pulse 88   Ht 5' 4.6" (1.641 m)   Wt 160 lb (72.6 kg)   LMP  11/07/2012   SpO2 97%   BMI 26.96 kg/m?    ? ?Wt Readings from Last 3 Encounters:  ?05/04/21 160 lb (72.6 kg)  ?04/17/21 156 lb (70.8 kg)  ?03/31/21 159 lb (72.1 kg)  ?  ? ?GEN: Patient is in no acute distress

## 2021-05-04 NOTE — Patient Instructions (Signed)
Medication Instructions:  ?Your physician recommends that you continue on your current medications as directed. Please refer to the Current Medication list given to you today. ? ?*If you need a refill on your cardiac medications before your next appointment, please call your pharmacy* ? ? ?Lab Work: ?None ordered ?If you have labs (blood work) drawn today and your tests are completely normal, you will receive your results only by: ?MyChart Message (if you have MyChart) OR ?A paper copy in the mail ?If you have any lab test that is abnormal or we need to change your treatment, we will call you to review the results. ? ? ?Testing/Procedures: ?Your physician has requested that you have an echocardiogram. Echocardiography is a painless test that uses sound waves to create images of your heart. It provides your doctor with information about the size and shape of your heart and how well your heart?s chambers and valves are working. This procedure takes approximately one hour. There are no restrictions for this procedure. ? ? ?WHY IS MY DOCTOR PRESCRIBING ZIO? ?The Zio system is proven and trusted by physicians to detect and diagnose irregular heart rhythms -- and has been prescribed to hundreds of thousands of patients. ? ?The FDA has cleared the Zio system to monitor for many different kinds of irregular heart rhythms. In a study, physicians were able to reach a diagnosis 90% of the time with the Zio system1. ? ?You can wear the Zio monitor -- a small, discreet, comfortable patch -- during your normal day-to-day activity, including while you sleep, shower, and exercise, while it records every single heartbeat for analysis. ? ?1Barrett, P., et al. Comparison of 24 Hour Holter Monitoring Versus 14 Day Novel Adhesive Patch Electrocardiographic Monitoring. [place], 2014. ? ?ZIO VS. HOLTER MONITORING ?The Zio monitor can be comfortably worn for up to 14 days. Holter monitors can be worn for 24 to 48  hours, limiting the time to record any irregular heart rhythms you may have. Zio is able to capture data for the 51% of patients who have their first symptom-triggered arrhythmia after 48 hours.1 ? ?LIVE WITHOUT RESTRICTIONS ?The Zio ambulatory cardiac monitor is a small, unobtrusive, and water-resistant patch--you might even forget you?re wearing it. The Zio monitor records and stores every beat of your heart, whether you're sleeping, working out, or showering. ?Wear the monitor for 14 days, remove 05/18/21. ? ?Follow-Up: ?At [facility], you and your health needs are our priority.  As part of our continuing mission to provide you with exceptional heart care, we have created designated Provider Care Teams.  These Care Teams include your primary Cardiologist (physician) and Advanced Practice Providers (APPs -  Physician Assistants and Nurse Practitioners) who all work together to provide you with the care you need, when you need it. ? ?We recommend signing up for the patient portal called "MyChart".  Sign up information is provided on this After Visit Summary.  MyChart is used to connect with patients for Virtual Visits (Telemedicine).  Patients are able to view lab/test results, encounter notes, upcoming appointments, etc.  Non-urgent messages can be sent to your provider as well.   ?To learn more about what you can do with MyChart, go to [email or web address].   ? ?Your next appointment:   ?As needed ? ?The format for your next appointment:   ?In Person ? ?Provider:   ?Jyl Heinz, MD ? ? ?Other Instructions ?Echocardiogram ?An echocardiogram is a test that uses sound waves (ultrasound) to produce images of  the heart. ?Images from an echocardiogram can provide important information about: ?Heart size and shape. ?The size and thickness and movement of your heart's walls. ?Heart muscle function and strength. ?Heart valve function or if you have stenosis. Stenosis is when the heart valves are too narrow. ?If  blood is flowing backward through the heart valves (regurgitation). ?A tumor or infectious growth around the heart valves. ?Areas of heart muscle that are not working well because of poor blood flow or injury from a heart attack. ?Aneurysm detection. An aneurysm is a weak or damaged part of an artery wall. The wall bulges out from the normal force of blood pumping through the body. ?Tell a health care provider about: ?Any allergies you have. ?All medicines you are taking, including vitamins, herbs, eye drops, creams, and over-the-counter medicines. ?Any blood disorders you have. ?Any surgeries you have had. ?Any medical conditions you have. ?Whether you are pregnant or may be pregnant. ?What are the risks? ?Generally, this is a safe test. However, problems may occur, including an allergic reaction to dye (contrast) that may be used during the test. ?What happens before the test? ?No specific preparation is needed. You may eat and drink normally. ?What happens during the test? ?You will take off your clothes from the waist up and put on a hospital gown. ?Electrodes or electrocardiogram (ECG)patches may be placed on your chest. The electrodes or patches are then connected to a device that monitors your heart rate and rhythm. ?You will lie down on a table for an ultrasound exam. A gel will be applied to your chest to help sound waves pass through your skin. ?A handheld device, called a transducer, will be pressed against your chest and moved over your heart. The transducer produces sound waves that travel to your heart and bounce back (or "echo" back) to the transducer. These sound waves will be captured in real-time and changed into images of your heart that can be viewed on a video monitor. The images will be recorded on a computer and reviewed by your health care provider. ?You may be asked to change positions or hold your breath for a short time. This makes it easier to get different views or better views of your  heart. ?In some cases, you may receive contrast through an IV in one of your veins. This can improve the quality of the pictures from your heart. ?The procedure may vary among health care providers and hospitals.   ?What can I expect after the test? ?You may return to your normal, everyday life, including diet, activities, and medicines, unless your health care provider tells you not to do that. ?Follow these instructions at home: ?It is up to you to get the results of your test. Ask your health care provider, or the department that is doing the test, when your results will be ready. ?Keep all follow-up visits. This is important. ?Summary ?An echocardiogram is a test that uses sound waves (ultrasound) to produce images of the heart. ?Images from an echocardiogram can provide important information about the size and shape of your heart, heart muscle function, heart valve function, and other possible heart problems. ?You do not need to do anything to prepare before this test. You may eat and drink normally. ?After the echocardiogram is completed, you may return to your normal, everyday life, unless your health care provider tells you not to do that. ?This information is not intended to replace advice given to you by your health care provider. Make  sure you discuss any questions you have with your health care provider. ?Document Revised: 09/15/2019 Document Reviewed: 09/15/2019 ?Elsevier Patient Education ? [place]. ? ? ?FDA-cleared personal EKG: The world?s most clinically validated personal EKG, FDA-cleared to detect Atrial Fibrillation, Bradycardia, and Tachycardia. Evalee Mutton is the most reliable way to check in on your heart from home. ?Take your EKG from anywhere: Capture a medical-grade EKG in 30 seconds and get an instant analysis right on your smartphone. Evalee Mutton is small enough to fit in your pocket, so you can take it with you anywhere. ?Easy to use: Simply place your fingers on the  sensors--no wires, patches, or gels. ?Recommended by doctors: A trusted resource, Evalee Mutton is the #1 doctor-recommended personal EKG with more than 100 million EKGs recorded. ?Save or share your EKGs: With the p

## 2021-05-04 NOTE — Addendum Note (Signed)
Addended by: Eleonore Chiquito on: 05/04/2021 03:02 PM ? ? Modules accepted: Orders ? ?

## 2021-05-05 ENCOUNTER — Other Ambulatory Visit: Payer: Self-pay | Admitting: Family Medicine

## 2021-05-22 ENCOUNTER — Ambulatory Visit (HOSPITAL_BASED_OUTPATIENT_CLINIC_OR_DEPARTMENT_OTHER)
Admission: RE | Admit: 2021-05-22 | Discharge: 2021-05-22 | Disposition: A | Discharge status: Home / Self Care | Payer: 59 | Source: Ambulatory Visit | Attending: Cardiology | Admitting: Cardiology

## 2021-05-22 DIAGNOSIS — R011 Cardiac murmur, unspecified: Secondary | ICD-10-CM | POA: Insufficient documentation

## 2021-05-22 NOTE — Progress Notes (Signed)
?  Echocardiogram ?2D Echocardiogram has been performed. ? ?Maria Lucas ?05/22/2021, 3:44 PM ?

## 2021-05-23 LAB — ECHOCARDIOGRAM COMPLETE
AR max vel: 1.59 cm2
AV Area VTI: 1.57 cm2
AV Area mean vel: 1.62 cm2
AV Mean grad: 6 mmHg
AV Peak grad: 11.3 mmHg
Ao pk vel: 1.68 m/s
Area-P 1/2: 3.21 cm2
S' Lateral: 2.3 cm

## 2021-05-30 ENCOUNTER — Ambulatory Visit (HOSPITAL_BASED_OUTPATIENT_CLINIC_OR_DEPARTMENT_OTHER)
Admission: RE | Admit: 2021-05-30 | Discharge: 2021-05-30 | Disposition: A | Discharge status: Home / Self Care | Payer: 59 | Source: Ambulatory Visit | Attending: Family Medicine | Admitting: Family Medicine

## 2021-05-30 ENCOUNTER — Ambulatory Visit (INDEPENDENT_AMBULATORY_CARE_PROVIDER_SITE_OTHER): Payer: 59 | Admitting: Family Medicine

## 2021-05-30 ENCOUNTER — Encounter: Payer: Self-pay | Admitting: Family Medicine

## 2021-05-30 VITALS — BP 123/71 | HR 86 | Ht 64.6 in | Wt 157.6 lb

## 2021-05-30 DIAGNOSIS — M79672 Pain in left foot: Secondary | ICD-10-CM | POA: Diagnosis present

## 2021-05-30 NOTE — Progress Notes (Signed)
? ?  Acute Office Visit ? ?Subjective:  ? ?  ?Patient ID: [name], female    DOB: 10/20/1960, 61 y.o.   MRN: [id number] ? ?CC: ankle pain ? ? ?HPI ?Patient is in today for left ankle pain ? ?Patient reports she fell and broke her left ankle last year requiring surgery (hardware used) in January 2022. States she had been recovering well, but over the past few weeks she has been trying to get back into daily walking. She has noticed a gradual increase in discomfort and swelling since walking more consistently. Denies any new injuries. Reports that prolonged walking will lead to soreness to lateral ankle and mild swelling laterally and dorsally. The pain resolves with rest/elevation, but she feels like some mild swelling is remaining at baseline. She denies any bruising, severe pain, popping/locking, altered ROM other than some mild stiffness. Reports that she tried to follow-up with the surgeon she used last year, but they are not in her network now.  ? ? ? ? ?ROS ?All review of systems negative except what is listed in the HPI ? ? ?   ?Objective:  ?  ?BP 123/71   Pulse 86   Ht 5' 4.6" (1.641 m)   Wt 157 lb 9.6 oz (71.5 kg)   LMP 11/07/2012   BMI 26.55 kg/m?  ? ? ?Physical Exam ?Vitals reviewed.  ?Constitutional:   ?   Appearance: Normal appearance. She is normal weight.  ?Cardiovascular:  ?   Pulses: Normal pulses.  ?Musculoskeletal:  ?   Comments: Left ankle with baseline ROM/mild stiffness, mild edema laterally and dorsally, no skin changes  ?Skin: ?   General: Skin is warm and dry.  ?Neurological:  ?   General: No focal deficit present.  ?   Mental Status: She is alert and oriented to person, place, and time. Mental status is at baseline.  ?Psychiatric:     ?   Mood and Affect: Mood normal.     ?   Behavior: Behavior normal.     ?   Thought Content: Thought content normal.     ?   Judgment: Judgment normal.  ? ? ?No results found for any visits on 05/30/21. ? ? ?   ?Assessment & Plan:  ? ?1. Left foot  pain ?Xray today to ensure no new changes since surgery ?Rest, ice, compression, elevation, NSAIDs ?We will decide what to do based on xray results - supportive measures vs new ortho referral vs her wanting to stay with previous even though out of network ?Patient aware of signs/symptoms requiring further/urgent evaluation.  ? ?- DG Ankle Complete Left; Future ? ? ? ?No orders of the defined types were placed in this encounter. ? ? ?Return if symptoms worsen or fail to improve, for - pending results . ? ?Clayborne Dana, NP ? ? ?

## 2021-05-30 NOTE — Progress Notes (Signed)
Left ankle pain x2 weeks ?Had surgery a year ago ?swelling ?

## 2021-05-30 NOTE — Patient Instructions (Signed)
Ankle pain: ?Xray today to ensure no new changes since surgery ?Rest, ice, compression, elevation, NSAIDs ?We will decide what to do based on xray results.  ?

## 2021-07-30 ENCOUNTER — Other Ambulatory Visit: Payer: Self-pay | Admitting: Family Medicine

## 2021-09-21 ENCOUNTER — Ambulatory Visit (INDEPENDENT_AMBULATORY_CARE_PROVIDER_SITE_OTHER): Payer: Commercial Managed Care - HMO | Admitting: Family Medicine

## 2021-09-21 VITALS — BP 138/72 | HR 86 | Temp 98.3°F | Resp 16 | Ht 64.0 in | Wt 159.0 lb

## 2021-09-21 DIAGNOSIS — E782 Mixed hyperlipidemia: Secondary | ICD-10-CM

## 2021-09-21 DIAGNOSIS — L578 Other skin changes due to chronic exposure to nonionizing radiation: Secondary | ICD-10-CM

## 2021-09-21 DIAGNOSIS — M8588 Other specified disorders of bone density and structure, other site: Secondary | ICD-10-CM | POA: Diagnosis not present

## 2021-09-21 DIAGNOSIS — R002 Palpitations: Secondary | ICD-10-CM | POA: Diagnosis not present

## 2021-09-21 DIAGNOSIS — R739 Hyperglycemia, unspecified: Secondary | ICD-10-CM

## 2021-09-21 DIAGNOSIS — K219 Gastro-esophageal reflux disease without esophagitis: Secondary | ICD-10-CM | POA: Diagnosis not present

## 2021-09-21 DIAGNOSIS — Z0000 Encounter for general adult medical examination without abnormal findings: Secondary | ICD-10-CM | POA: Diagnosis not present

## 2021-09-21 LAB — COMPREHENSIVE METABOLIC PANEL
ALT: 17 U/L (ref 0–35)
AST: 23 U/L (ref 0–37)
Albumin: 4.6 g/dL (ref 3.5–5.2)
Alkaline Phosphatase: 88 U/L (ref 39–117)
BUN: 16 mg/dL (ref 6–23)
CO2: 29 mEq/L (ref 19–32)
Calcium: 9.6 mg/dL (ref 8.4–10.5)
Chloride: 101 mEq/L (ref 96–112)
Creatinine, Ser: 0.84 mg/dL (ref 0.40–1.20)
GFR: 75.52 mL/min (ref >60.00)
Glucose, Bld: 95 mg/dL (ref 70–99)
Potassium: 4 mEq/L (ref 3.5–5.1)
Sodium: 141 mEq/L (ref 135–145)
Total Bilirubin: 0.6 mg/dL (ref 0.2–1.2)
Total Protein: 7 g/dL (ref 6.0–8.3)

## 2021-09-21 LAB — HEMOGLOBIN A1C: Hgb A1c MFr Bld: 6 % (ref 4.6–6.5)

## 2021-09-21 LAB — LIPID PANEL
Cholesterol: 192 mg/dL (ref 0–200)
HDL: 83.3 mg/dL (ref >39.00)
LDL Cholesterol: 87 mg/dL (ref 0–99)
NonHDL: 108.42
Total CHOL/HDL Ratio: 2
Triglycerides: 109 mg/dL (ref 0.0–149.0)
VLDL: 21.8 mg/dL (ref 0.0–40.0)

## 2021-09-21 LAB — CBC
HCT: 41.3 % (ref 36.0–46.0)
Hemoglobin: 13.5 g/dL (ref 12.0–15.0)
MCHC: 32.8 g/dL (ref 30.0–36.0)
MCV: 96.1 fl (ref 78.0–100.0)
Platelets: 173 10*3/uL (ref 150.0–400.0)
RBC: 4.29 Mil/uL (ref 3.87–5.11)
RDW: 12.7 % (ref 11.5–15.5)
WBC: 5.1 10*3/uL (ref 4.0–10.5)

## 2021-09-21 LAB — TSH: TSH: 1.35 u[IU]/mL (ref 0.35–5.50)

## 2021-09-21 NOTE — Patient Instructions (Addendum)
Bone density shows osteopenia, which is thinner than normal but not as bad as osteoporosis. Recommend calcium intake of 1200 to 1500 mg daily, divided into roughly 3 doses. Best source is the diet and a single dairy serving is about 500 mg, a supplement of calcium citrate once or twice daily to balance diet is fine if not getting enough in diet. Also need Vitamin D 2000 IU caps, 1 cap daily if not already taking vitamin D. Also recommend weight baring exercise on hips and upper body to keep bones strong   Shingrix is the new shingles shot, 2 shots over 2-6 months, confirm coverage with insurance and document, then can return here for shots with nurse appt or at pharmacy   RSV immunization at 61   Covid immunization is new late sept or early 61  Preventive Care 60-65 Years Old, Female Preventive care refers to lifestyle choices and visits with your health care provider that can promote health and wellness. Preventive care visits are also called wellness exams. What can I expect for my preventive care visit? Counseling Your health care provider may ask you questions about your: Medical history, including: Past medical problems. Family medical history. Pregnancy history. Current health, including: Menstrual cycle. Method of birth control. Emotional well-being. Home life and relationship well-being. Sexual activity and sexual health. Lifestyle, including: Alcohol, nicotine or tobacco, and drug use. Access to firearms. Diet, exercise, and sleep habits. Work and work Statistician. Sunscreen use. Safety issues such as seatbelt and bike helmet use. Physical exam Your health care provider will check your: Height and weight. These may be used to calculate your BMI (body mass index). BMI is a measurement that tells if you are at a healthy weight. Waist circumference. This measures the distance around your waistline. This measurement also tells if you are at a healthy weight and may help predict  your risk of certain diseases, such as type 2 diabetes and high blood pressure. Heart rate and blood pressure. Body temperature. Skin for abnormal spots. What immunizations do I need?  Vaccines are usually given at various ages, according to a schedule. Your health care provider will recommend vaccines for you based on your age, medical history, and lifestyle or other factors, such as travel or where you work. What tests do I need? Screening Your health care provider may recommend screening tests for certain conditions. This may include: Lipid and cholesterol levels. Diabetes screening. This is done by checking your blood sugar (glucose) after you have not eaten for a while (fasting). Pelvic exam and Pap test. Hepatitis B test. Hepatitis C test. HIV (human immunodeficiency virus) test. STI (sexually transmitted infection) testing, if you are at risk. Lung cancer screening. Colorectal cancer screening. Mammogram. Talk with your health care provider about when you should start having regular mammograms. This may depend on whether you have a family history of breast cancer. BRCA-related cancer screening. This may be done if you have a family history of breast, ovarian, tubal, or peritoneal cancers. Bone density scan. This is done to screen for osteoporosis. Talk with your health care provider about your test results, treatment options, and if necessary, the need for more tests. Follow these instructions at home: Eating and drinking  Eat a diet that includes fresh fruits and vegetables, whole grains, lean protein, and low-fat dairy products. Take vitamin and mineral supplements as recommended by your health care provider. Do not drink alcohol if: Your health care provider tells you not to drink. You are pregnant, may be  pregnant, or are planning to become pregnant. If you drink alcohol: Limit how much you have to 0-1 drink a day. Know how much alcohol is in your drink. In the [place] one  drink equals one 12 oz bottle of beer (355 mL), one 5 oz glass of wine (148 mL), or one 1 oz glass of hard liquor (44 mL). Lifestyle Brush your teeth every morning and night with fluoride toothpaste. Floss one time each day. Exercise for at least 30 minutes 5 or more days each week. Do not use any products that contain nicotine or tobacco. These products include cigarettes, chewing tobacco, and vaping devices, such as e-cigarettes. If you need help quitting, ask your health care provider. Do not use drugs. If you are sexually active, practice safe sex. Use a condom or other form of protection to prevent STIs. If you do not wish to become pregnant, use a form of birth control. If you plan to become pregnant, see your health care provider for a prepregnancy visit. Take aspirin only as told by your health care provider. Make sure that you understand how much to take and what form to take. Work with your health care provider to find out whether it is safe and beneficial for you to take aspirin daily. Find healthy ways to manage stress, such as: Meditation, yoga, or listening to music. Journaling. Talking to a trusted person. Spending time with friends and family. Minimize exposure to UV radiation to reduce your risk of skin cancer. Safety Always wear your seat belt while driving or riding in a vehicle. Do not drive: If you have been drinking alcohol. Do not ride with someone who has been drinking. When you are tired or distracted. While texting. If you have been using any mind-altering substances or drugs. Wear a helmet and other protective equipment during sports activities. If you have firearms in your house, make sure you follow all gun safety procedures. Seek help if you have been physically or sexually abused. What's next? Visit your health care provider once a year for an annual wellness visit. Ask your health care provider how often you should have your eyes and teeth checked. Stay up  to date on all vaccines. This information is not intended to replace advice given to you by your health care provider. Make sure you discuss any questions you have with your health care provider. Document Revised: 07/20/2020 Document Reviewed: 07/20/2020 Elsevier Patient Education  [place].

## 2021-09-21 NOTE — Progress Notes (Signed)
**Note Maria-Identified via Obfuscation** Subjective:   By signing my name below, I, Maria Lucas, attest that this documentation has been prepared under the direction and in the presence of Bradd Canary, MD 09/21/2021.    Patient ID: Maria Lucas, female    DOB: 05-May-1960, 61 y.o.   MRN: [id number]  Chief Complaint  Patient presents with   Annual Exam   HPI Patient is in today for a comprehensive physical exam.   She denies having any fever, chills, ear pain, headaches, new muscle pain, joint pain, new moles, rash, itching, congestion, sinus pain, sore throat, chest pain, palpitations, wheezing, nausea, vomitting, abdominal pain, diarrhea, constipation, blood in stool, dysuria, urgency, frequency and hematuria.  Ankle fracture: She reports that she fractured her ankle and underwent surgery on 01/22 under Dr. Victorino Dike, receiving screws and wires.  Family history: She reports no recent changes to her family history.   Immunizations: She is up to date on her Tetanus immunization. She is due for her Shingles immunizations. She has been informed about receiving COVID-19 and RSV immunizations in the fall.   Exercise: She states that she walks to remain active.   Vitamin D: She is currently taking Calcium-Magnesium- Vitam D.   Dermatology: She states that she does not currently see a dermatologist.   Past Medical History:  Diagnosis Date   Acid reflux 10/29/2017   Allergy 04/12/2011   Anemia    menorraghia   Atrophic vaginitis 10/12/2013   Atypical chest pain 03/12/2011   Breast mass, left 03/21/2011   Cervical cancer screening 03/21/2011   Menarche at 17, 5-10 day cycles, variable flow LMP 03/06/11 G1P1 s/p SVD Fibrocystic breast by [facility] in past, last MGM 3 years ago. H/o endometrial cells in a woman over 40 once, no there abnormal paps Last pap and MGM 3 years ago.   Closed trimalleolar fracture of left ankle 02/10/2020   Complication of anesthesia    slow to wake up   Elevated antinuclear antibody (ANA) level 03/12/2011    Elevated BP 03/12/2011   Patient reports 115-130/60-80 when she checks it elsewhere She believes it is secondary to a [place] phenomenon   Glaucoma 03/12/2011   Heart murmur    History of gestational diabetes 08/18/2018   Hyperglycemia 03/12/2011   H/o gestational DM, diet controlled   Hyperlipidemia 03/12/2011   Left foot pain 01/10/2017   Trimalleoloar fracture in early 2022 now with screw, wire and plate   Osteopenia of lumbar spine 08/18/2018   Palpitations    Perennial allergic rhinitis 08/18/2018   Last Assessment & Plan:  Formatting of this note might be different from the original. Symptoms controlled with daily loratadine.  She feels more chest tightness lately related to being outdoors more, she has no exertional symptoms when going for exercise. Presently symptom-free, consider additional medication or changing out antihistamines for better control if needed.  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     Past Surgical History:  Procedure Laterality Date   LACERATION REPAIR     age 7, dog attack, stitches in face, multipe sites right side of  face   ORIF ANKLE FRACTURE Left 02/18/2020   Procedure: OPEN REDUCTION INTERNAL FIXATION (ORIF) Left trimalleolar fracture,  syndesmosis repair;  Surgeon: Toni Arthurs, MD;  Location: [facility];  Service: Orthopedics;  Laterality: Left;     Family History  Problem Relation Age of Onset   Cancer Mother        skin, bcc, scc   Heart disease Mother        arrythmia   Hyperlipidemia Mother    Melanoma Mother    Osteoporosis Mother     Heart attack Father 78       smoker/drinker   Hypertension Father    Hyperlipidemia Father    Heart disease Father 93       MI   Varicose Veins Father    Hypertension Sister    Ovarian cysts Sister    Hyperlipidemia Sister    Heart attack Brother 29   Hypertension Brother    Heart disease Brother    Hyperlipidemia Brother    Heart attack Maternal Grandmother    Hypertension Maternal Grandmother        valvular heart disease   Heart disease Maternal Grandmother    Vascular Disease Maternal Grandmother    Cancer Maternal Grandfather        skin, bcc   Glaucoma Maternal Grandfather    Stroke Paternal Grandfather    Anxiety disorder Daughter    OCD Daughter    Colon cancer Neg Hx     Social History   Socioeconomic History   Marital status: Married    Spouse name: Not on file   Number of children: Not on file   Years of education: Not on file   Highest education level: Not on file  Occupational History   Not on file  Tobacco Use   Smoking status: Never   Smokeless tobacco: Never  Substance and Sexual Activity   Alcohol use: No   Drug use: No   Sexual activity: Yes    Partners: Male  Other Topics Concern   Not on file  Social History Narrative   Not on file   Social Determinants of Health   Financial Resource Strain: Not on file  Food Insecurity: Not on file  Transportation Needs: Not on file  Physical Activity: Not on file  Stress: Not on file  Social Connections: Not on file  Intimate Partner Violence: Not on file    Outpatient Medications Prior to Visit  Medication Sig Dispense Refill   aspirin 81 MG tablet Take 81 mg by mouth daily.     atorvastatin (LIPITOR) 10 MG tablet TAKE ONE TABLET BY MOUTH DAILY AT BEDTIME 90 tablet 0   Calcium-Magnesium-Vitamin D (CITRACAL CALCIUM+D PO) Take 1 tablet by mouth daily.     Cholecalciferol (VITAMIN D) 2000 units CAPS Take 1 capsule (2,000 Units total) by mouth daily. 30 capsule    Coenzyme Q10 10 MG capsule  Take 1 capsule by mouth daily.     Flaxseed Oil OIL Take 1,400 mg by mouth daily.     IRON, FERROUS GLUCONATE, PO Take 65 mg by mouth daily.     latanoprost (XALATAN) 0.005 % ophthalmic solution Place 1 drop into both eyes at bedtime.     multivitamin-iron-minerals-folic acid (CENTRUM) chewable tablet Chew 1 tablet by mouth daily.     Omega-3 350 MG CPDR Take 1 tablet by mouth daily.     Probiotic Product (PROBIOTIC DAILY PO) Take 1 each by mouth daily.  No facility-administered medications prior to visit.    Allergies  Allergen Reactions   Penicillins Rash   Clindamycin Hcl Rash    Review of Systems  Constitutional:  Negative for chills and fever.  HENT:  Negative for congestion, ear pain, sinus pain and sore throat.   Respiratory:  Negative for cough, shortness of breath and wheezing.   Cardiovascular:  Negative for chest pain.  Gastrointestinal:  Negative for abdominal pain, blood in stool, constipation, diarrhea, nausea and vomiting.  Genitourinary:  Negative for dysuria, frequency, hematuria and urgency.  Musculoskeletal:  Negative for joint pain and myalgias.  Skin:  Negative for itching and rash.       (-) New moles.  Neurological:  Negative for headaches.       Objective:    Physical Exam Constitutional:      General: She is not in acute distress.    Appearance: Normal appearance. She is not ill-appearing.  HENT:     Head: Normocephalic and atraumatic.     Right Ear: Tympanic membrane, ear canal and external ear normal.     Left Ear: Tympanic membrane, ear canal and external ear normal.     Mouth/Throat:     Mouth: Mucous membranes are moist.     Pharynx: Oropharynx is clear.  Eyes:     Extraocular Movements: Extraocular movements intact.     Right eye: No nystagmus.     Left eye: No nystagmus.     Pupils: Pupils are equal, round, and reactive to light.  Neck:     Vascular: No carotid bruit.  Cardiovascular:     Rate and Rhythm: Normal rate and regular  rhythm.     Pulses: Normal pulses.     Heart sounds: Normal heart sounds. No murmur heard.    No gallop.  Pulmonary:     Effort: Pulmonary effort is normal. No respiratory distress.     Breath sounds: Normal breath sounds. No wheezing or rales.  Abdominal:     General: Bowel sounds are normal.     Palpations: Abdomen is soft.     Tenderness: There is no abdominal tenderness.  Musculoskeletal:     Comments: Muscle strength 5/5 on upper and lower extremities.  Lymphadenopathy:     Cervical: No cervical adenopathy.  Skin:    General: Skin is warm and dry.  Neurological:     Mental Status: She is alert and oriented to person, place, and time.     Deep Tendon Reflexes:     Reflex Scores:      Patellar reflexes are 2+ on the right side and 2+ on the left side. Psychiatric:        Mood and Affect: Mood normal.        Behavior: Behavior normal.        Judgment: Judgment normal.     BP 138/72 (BP Location: Right Arm, Patient Position: Sitting, Cuff Size: Normal)   Pulse 86   Temp 98.3 F (36.8 C) (Oral)   Resp 16   Ht 5\' 4"  (1.626 m)   Wt 159 lb (72.1 kg)   LMP 11/05/2012   SpO2 99%   BMI 27.29 kg/m  Wt Readings from Last 3 Encounters:  09/21/21 159 lb (72.1 kg)  05/30/21 157 lb 9.6 oz (71.5 kg)  05/04/21 160 lb (72.6 kg)    Diabetic Foot Exam - Simple   No data filed    Lab Results  Component Value Date   WBC 5.1 09/21/2021  HGB 13.5 09/21/2021   HCT 41.3 09/21/2021   PLT 173.0 09/21/2021   GLUCOSE 95 09/21/2021   CHOL 192 09/21/2021   TRIG 109.0 09/21/2021   HDL 83.30 09/21/2021   LDLDIRECT 122.7 03/26/2012   LDLCALC 87 09/21/2021   ALT 17 09/21/2021   AST 23 09/21/2021   NA 141 09/21/2021   K 4.0 09/21/2021   CL 101 09/21/2021   CREATININE 0.84 09/21/2021   BUN 16 09/21/2021   CO2 29 09/21/2021   TSH 1.35 09/21/2021   HGBA1C 6.0 09/21/2021    Lab Results  Component Value Date   TSH 1.35 09/21/2021   Lab Results  Component Value Date   WBC 5.1  09/21/2021   HGB 13.5 09/21/2021   HCT 41.3 09/21/2021   MCV 96.1 09/21/2021   PLT 173.0 09/21/2021   Lab Results  Component Value Date   NA 141 09/21/2021   K 4.0 09/21/2021   CO2 29 09/21/2021   GLUCOSE 95 09/21/2021   BUN 16 09/21/2021   CREATININE 0.84 09/21/2021   BILITOT 0.6 09/21/2021   ALKPHOS 88 09/21/2021   AST 23 09/21/2021   ALT 17 09/21/2021   PROT 7.0 09/21/2021   ALBUMIN 4.6 09/21/2021   CALCIUM 9.6 09/21/2021   GFR 75.52 09/21/2021   Lab Results  Component Value Date   CHOL 192 09/21/2021   Lab Results  Component Value Date   HDL 83.30 09/21/2021   Lab Results  Component Value Date   LDLCALC 87 09/21/2021   Lab Results  Component Value Date   TRIG 109.0 09/21/2021   Lab Results  Component Value Date   CHOLHDL 2 09/21/2021   Lab Results  Component Value Date   HGBA1C 6.0 09/21/2021   Colonoscopy: Last completed on 06/19/2013. Normal results. Repeat in 2025.   Dexxa: Last completed on 10/18/2020. -The probability of a major osteoporotic fracture is 14.3% within the next 10 years. -The probability of a hip fracture is 1.5% within the next ten years.  Repeat in 10/2022.   Pap Smear: Last completed on 09/19/2020. Normal results. Repeat in 3-5 years.    Mammogram: Last completed on 10/18/2020. No mammographic evidence of malignancy. Repeat in 2023. Due this fall    Assessment & Plan:   Problem List Items Addressed This Visit     Palpitations   Relevant Orders   CBC (Completed)   TSH (Completed)   Hyperlipidemia    Encourage heart healthy diet such as MIND or DASH diet, increase exercise, avoid trans fats, simple carbohydrates and processed foods, consider a krill or fish or flaxseed oil cap daily. Tolerating Atorvastatin      Relevant Orders   Lipid panel (Completed)   Hyperglycemia    hgba1c acceptable, minimize simple carbs. Increase exercise as tolerated.       Relevant Orders   Comprehensive metabolic panel (Completed)   TSH  (Completed)   Hemoglobin A1c (Completed)   Preventative health care - Primary    Patient encouraged to maintain heart healthy diet, regular exercise, adequate sleep. Consider daily probiotics. Take medications as prescribed.Colonoscopy: Last completed on 06/19/2013. Normal results. Repeat in 2025.   Dexxa: Last completed on 10/18/2020. -The probability of a major osteoporotic fracture is 14.3% within the next 10 years. -The probability of a hip fracture is 1.5% within the next ten years.  Repeat in 10/2022.   Pap Smear: Last completed on 09/19/2020. Normal results. Repeat in 3-5 years.    Mammogram: Last completed on 10/18/2020. No mammographic evidence  of malignancy. Repeat in 2023. Due this fall      Sun-damaged skin    Referred to dermatology for further evaluation      Relevant Orders   Ambulatory referral to Dermatology   Acid reflux   Relevant Orders   CBC (Completed)   Osteopenia of lumbar spine    Bone density shows osteopenia, which is thinner than normal but not as bad as osteoporosis. Recommend calcium intake of 1200 to 1500 mg daily, divided into roughly 3 doses. Best source is the diet and a single dairy serving is about 500 mg, a supplement of calcium citrate once or twice daily to balance diet is fine if not getting enough in diet. Also need Vitamin D 2000 IU caps, 1 cap daily if not already taking vitamin D. Also recommend weight baring exercise on hips and upper body to keep bones strong      No orders of the defined types were placed in this encounter.  I, Maria Edge, MD, personally preformed the services described in this documentation.  All medical record entries made by the scribe were at my direction and in my presence.  I have reviewed the chart and discharge instructions (if applicable) and agree that the record reflects my personal performance and is accurate and complete. 09/21/2021  I,Maria Lucas,acting as a scribe for Maria Edge, MD.,have documented  all relevant documentation on the behalf of Maria Edge, MD,as directed by  Maria Edge, MD while in the presence of Maria Edge, MD.  Maria Edge, MD

## 2021-09-22 NOTE — Assessment & Plan Note (Signed)
Encourage heart healthy diet such as MIND or DASH diet, increase exercise, avoid trans fats, simple carbohydrates and processed foods, consider a krill or fish or flaxseed oil cap daily. Tolerating Atorvastatin 

## 2021-09-22 NOTE — Assessment & Plan Note (Signed)
Referred to dermatology for further evaluation.

## 2021-09-22 NOTE — Assessment & Plan Note (Signed)
hgba1c acceptable, minimize simple carbs. Increase exercise as tolerated.  

## 2021-09-22 NOTE — Assessment & Plan Note (Signed)

## 2021-09-22 NOTE — Assessment & Plan Note (Signed)
Patient encouraged to maintain heart healthy diet, regular exercise, adequate sleep. Consider daily probiotics. Take medications as prescribed.Colonoscopy: Last completed on 06/19/2013. Normal results. Repeat in 2025.   Dexxa: Last completed on 10/18/2020. -The probability of a major osteoporotic fracture is 14.3% within the next 10 years. -The probability of a hip fracture is 1.5% within the next ten years.  Repeat in 10/2022.   Pap Smear: Last completed on 09/19/2020. Normal results. Repeat in 3-5 years.    Mammogram: Last completed on 10/18/2020. No mammographic evidence of malignancy. Repeat in 2023. Due this fall

## 2021-10-27 ENCOUNTER — Other Ambulatory Visit: Payer: Self-pay | Admitting: Family Medicine

## 2022-06-30 ENCOUNTER — Telehealth (HOSPITAL_COMMUNITY): Payer: Self-pay | Admitting: Emergency Medicine

## 2022-06-30 ENCOUNTER — Ambulatory Visit (HOSPITAL_COMMUNITY)
Admission: EM | Admit: 2022-06-30 | Discharge: 2022-06-30 | Disposition: A | Discharge status: Home / Self Care | Payer: Commercial Managed Care - HMO | Attending: Emergency Medicine | Admitting: Emergency Medicine

## 2022-06-30 ENCOUNTER — Other Ambulatory Visit: Payer: Self-pay

## 2022-06-30 ENCOUNTER — Encounter (HOSPITAL_COMMUNITY): Payer: Self-pay | Admitting: Emergency Medicine

## 2022-06-30 DIAGNOSIS — W57XXXA Bitten or stung by nonvenomous insect and other nonvenomous arthropods, initial encounter: Secondary | ICD-10-CM

## 2022-06-30 DIAGNOSIS — R238 Other skin changes: Secondary | ICD-10-CM

## 2022-06-30 DIAGNOSIS — S30860A Insect bite (nonvenomous) of lower back and pelvis, initial encounter: Secondary | ICD-10-CM | POA: Diagnosis not present

## 2022-06-30 MED ORDER — DOXYCYCLINE HYCLATE 100 MG PO CAPS
100.0000 mg | ORAL_CAPSULE | Freq: Two times a day (BID) | ORAL | 0 refills | Status: DC
  Filled 2022-06-30: qty 14, 7d supply, fill #0

## 2022-06-30 MED ORDER — CEPHALEXIN 500 MG PO CAPS: 500.0000 mg | ORAL_CAPSULE | Freq: Two times a day (BID) | ORAL | 0 refills | Status: DC

## 2022-06-30 NOTE — ED Triage Notes (Signed)
Pt states she has a tick bite since last night on her left back shoulder, very irritated and itchy.

## 2022-06-30 NOTE — Discharge Instructions (Signed)
Today you were evaluated for your tick bite, on exam there is redness surrounding the bite mark most likely from irritation however as there is an open area to your skin will provide you with bacterial coverage to ensure this does not become infected, presentation of redness is not consistent with a bull's-eye appearance of Lyme disease  Keflex every morning and every evening for 5 days  For management of itchiness you may use a topical hydrocortisone cream or Benadryl cream twice daily over the affected area  For any concerns regarding healing please return for reevaluation or follow-up with your primary doctor

## 2022-06-30 NOTE — ED Provider Notes (Signed)
[facility]    CSN: [id number] Arrival date & time: 06/30/22  1547      History   Chief Complaint Chief Complaint  Patient presents with   Insect Bite    Pt states she has a tick bite since last night on her left back shoulder, very irritated and itchy.     HPI Maria Lucas is a 62 y.o. female.   Patient presents for evaluation of erythema and pruritus present to the back after tick bite that occurred 1 day ago.  Tick was then removed in its entirety and patient has brought tick to examination.  Has cleansed with alcohol and apply topical antibiotic ointment.  Denies fever or drainage.     Past Medical History:  Diagnosis Date   Acid reflux 10/29/2017   Allergy 04/12/2011   Anemia    menorraghia   Atrophic vaginitis 10/12/2013   Atypical chest pain 03/12/2011   Breast mass, left 03/21/2011   Cervical cancer screening 03/21/2011   Menarche at 17, 5-10 day cycles, variable flow LMP 03/06/11 G1P1 s/p SVD Fibrocystic breast by [facility] in past, last MGM 3 years ago. H/o endometrial cells in a woman over 40 once, no there abnormal paps Last pap and MGM 3 years ago.   Closed trimalleolar fracture of left ankle 02/10/2020   Complication of anesthesia    slow to wake up   Elevated antinuclear antibody (ANA) level 03/12/2011   Elevated BP 03/12/2011   Patient reports 115-130/60-80 when she checks it elsewhere She believes it is secondary to a [place] phenomenon   Glaucoma 03/12/2011   Heart murmur    History of gestational diabetes 08/18/2018   Hyperglycemia 03/12/2011   H/o gestational DM, diet controlled   Hyperlipidemia 03/12/2011   Left foot pain 01/10/2017   Trimalleoloar fracture in early 2022 now with screw, wire and plate   Osteopenia of lumbar spine 08/18/2018   Palpitations    Perennial allergic rhinitis 08/18/2018   Last Assessment & Plan:  Formatting of this note might be different from the original. Symptoms controlled with daily loratadine.  She feels more chest tightness  lately related to being outdoors more, she has no exertional symptoms when going for exercise. Presently symptom-free, consider additional medication or changing out antihistamines for better control if needed.  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/07/2013   Perimenopausal 10/05/2012   Allergy 04/12/2011   SOM (serous otitis media) 04/12/2011   Cervical cancer  screening 03/21/2011   Tachycardia 03/21/2011   Breast mass, left 03/21/2011   Atypical chest pain 03/12/2011   Glaucoma 03/12/2011   Elevated antinuclear antibody (ANA) level 03/12/2011   Hyperlipidemia 03/12/2011   Hyperglycemia 03/12/2011   Elevated BP 03/12/2011   Palpitations    Anemia     Past Surgical History:  Procedure Laterality Date   LACERATION REPAIR     age 23, dog attack, stitches in face, multipe sites right side of face   ORIF ANKLE FRACTURE Left 02/18/2020   Procedure: OPEN REDUCTION INTERNAL  FIXATION (ORIF) Left trimalleolar fracture,  syndesmosis repair;  Surgeon: Toni Arthurs, MD;  Location: [facility];  Service: Orthopedics;  Laterality: Left;     OB History   No obstetric history on file.      Home Medications    Prior to Admission medications   Medication Sig Start Date End Date Taking? Authorizing Provider  aspirin 81 MG tablet Take 81 mg by mouth daily.    [provider]  atorvastatin (LIPITOR) 10 MG tablet Take 1 tablet (10 mg total) by mouth at bedtime. 10/27/21   Bradd Canary, MD  Calcium-Magnesium-Vitamin D (CITRACAL CALCIUM+D PO) Take 1 tablet by mouth daily.    [provider]  Cholecalciferol (VITAMIN D) 2000 units CAPS Take 1 capsule (2,000 Units total) by mouth daily. 10/29/17   Bradd Canary, MD  Coenzyme Q10 10 MG capsule Take 1 capsule by mouth daily.    [provider]  Flaxseed Oil OIL Take 1,400 mg by mouth daily.    [provider]  IRON, FERROUS GLUCONATE, PO Take 65 mg by mouth daily.    [provider]  latanoprost (XALATAN) 0.005 % ophthalmic solution Place 1 drop into both eyes at bedtime.    [provider]  multivitamin-iron-minerals-folic acid (CENTRUM) chewable tablet Chew 1 tablet by mouth daily.    [provider]  Omega-3 350 MG CPDR Take 1 tablet by mouth daily.    [provider]  Probiotic Product (PROBIOTIC DAILY PO) Take 1 each by mouth daily.    [provider]    Family History Family History  Problem Relation Age of Onset   Cancer Mother        skin, bcc, scc   Heart disease Mother        arrythmia   Hyperlipidemia Mother    Melanoma Mother    Osteoporosis Mother    Heart attack Father 50       smoker/drinker   Hypertension Father    Hyperlipidemia Father    Heart disease Father 78       MI   Varicose Veins Father    Hypertension Sister    Ovarian cysts Sister    Hyperlipidemia Sister    Heart attack Brother 55    Hypertension Brother    Heart disease Brother    Hyperlipidemia Brother    Heart attack Maternal Grandmother    Hypertension Maternal Grandmother        valvular heart disease   Heart disease Maternal Grandmother    Vascular Disease Maternal Grandmother    Cancer Maternal Grandfather        skin, bcc   Glaucoma Maternal Grandfather    Stroke Paternal Grandfather    Anxiety disorder Daughter    OCD Daughter    Colon cancer Neg Hx     Social History Social History   Tobacco Use   Smoking status: Never  Smokeless tobacco: Never  Substance Use Topics   Alcohol use: No   Drug use: No     Allergies   Penicillins and Clindamycin hcl   Review of Systems Review of Systems   Physical Exam Triage Vital Signs ED Triage Vitals  Enc Vitals Group     BP 06/30/22 1601 (!) 170/90     Pulse Rate 06/30/22 1601 83     Resp 06/30/22 1601 16     Temp 06/30/22 1601 98 F (36.7 C)     Temp Source 06/30/22 1601 Oral     SpO2 06/30/22 1601 99 %     Weight 06/30/22 1602 158 lb 11.7 oz (72 kg)     Height 06/30/22 1602 5\' 4"  (1.626 m)     Head Circumference --      Peak Flow --      Pain Score 06/30/22 1602 0     Pain Loc --      Pain Edu? --      Excl. in GC? --    No data found.  Updated Vital Signs BP (!) 170/90 (BP Location: Right Arm)   Pulse 83   Temp 98 F (36.7 C) (Oral)   Resp 16   Ht 5\' 4"  (1.626 m)   Wt 158 lb 11.7 oz (72 kg)   LMP 11/05/2012   SpO2 99%   BMI 27.25 kg/m   Visual Acuity Right Eye Distance:   Left Eye Distance:   Bilateral Distance:    Right Eye Near:   Left Eye Near:    Bilateral Near:     Physical Exam Constitutional:      Appearance: Normal appearance.  HENT:     Head: Normocephalic.  Eyes:     Extraocular Movements: Extraocular movements intact.  Pulmonary:     Effort: Pulmonary effort is normal.  Skin:    Comments: Less than 0.5 cm puncture to the left upper thoracic region with surrounding erythema, no drainage present,  skin is warm to touch but is not hot  Neurological:     Mental Status: She is alert and oriented to person, place, and time. Mental status is at baseline.      UC Treatments / Results  Labs (all labs ordered are listed, but only abnormal results are displayed) Labs Reviewed - No data to display  EKG   Radiology No results found.  Procedures Procedures (including critical care time)  Medications Ordered in UC Medications - No data to display  Initial Impression / Assessment and Plan / UC Course  I have reviewed the triage vital signs and the nursing notes.  Pertinent labs & imaging results that were available during my care of the patient were reviewed by me and considered in my medical decision making (see chart for details).  Skin irritation, tick bite of back, initial encounter   Most likely bite reaction, no signs of infection, presentation of rash is not consistent with bull's-eye appearance and in [place] low risk for Lyme disease, discussed with patient, prophylactically providing antibiotic as a preventative measure, cephalexin prescribed, confirm penicillin allergy with reaction as rash however at pharmacy patient unwilling to take antibiotic, doxycycline sent in as an alternative, recommended topical hydrocortisone cream or Benadryl cream for management of pruritus, may follow-up with her primary doctor for further concerns Final Clinical Impressions(s) / UC Diagnoses   Final diagnoses:  None   Discharge Instructions   None    ED Prescriptions   None  PDMP not reviewed this encounter.   Valinda Hoar, NP 06/30/22 1721

## 2022-06-30 NOTE — Telephone Encounter (Signed)
Notified by [facility] pharmacy that patient unwilling to take cephalexin due to penicillin allergy, confirmed with patient prior to discharge that penicillin allergy is a rash therefore low risk for reaction to cephalosporin family, discussed medication with patient before discharge however as she is declining prescription at pharmacy will prescribe doxycycline as an alternative to be taken twice daily for 7 days, for any further concerns patient may follow-up with her primary doctor for further evaluation and management as antibiotic is a prophylactic measure and there are no signs of infection on exam

## 2022-07-01 ENCOUNTER — Telehealth (HOSPITAL_COMMUNITY): Payer: Self-pay | Admitting: Emergency Medicine

## 2022-07-01 MED ORDER — DOXYCYCLINE HYCLATE 100 MG PO CAPS: 100.0000 mg | ORAL_CAPSULE | Freq: Two times a day (BID) | ORAL | 0 refills | Status: DC

## 2022-07-01 MED ORDER — CEPHALEXIN 500 MG PO CAPS: 500.0000 mg | ORAL_CAPSULE | Freq: Two times a day (BID) | ORAL | 0 refills | Status: AC

## 2022-07-02 ENCOUNTER — Other Ambulatory Visit: Payer: Self-pay

## 2022-07-03 ENCOUNTER — Telehealth: Payer: Self-pay

## 2022-07-03 NOTE — Telephone Encounter (Signed)
Pt seen at UC.  

## 2022-07-03 NOTE — Telephone Encounter (Signed)
Initial Comment Caller states she was bit by a tick and they got it off but there is a round circle and it is itchy and red. Translation No Nurse Assessment Nurse: Tresa Endo, RN, Kim Date/Time (Eastern Time): 06/30/2022 2:47:49 PM Confirm and document reason for call. If symptomatic, describe symptoms. ---Caller states a tick was removed from the left side of her back at her shoulder, thinks it must have been there since last night. States the entire tick was removed. States this was a bigger tick but wasn't sure if it was engorged. No fever. There is a bullseye around the site. Does the patient have any new or worsening symptoms? ---Yes Will a triage be completed? ---Yes Related visit to physician within the last 2 weeks? ---No Does the PT have any chronic conditions? (i.e. diabetes, asthma, this includes High risk factors for pregnancy, etc.) ---No Is this a behavioral health or substance abuse call? ---No Guidelines Guideline Title Affirmed Question Affirmed Notes Nurse Date/Time (Eastern Time) Tick Bite Red ring or bull's-eye rash occurs at tick bite Tresa Endo, RN, Selena Batten 06/30/2022 2:51:37 PM Disp. Time Lamount Cohen Time) Disposition Final User 06/30/2022 2:55:23 PM See PCP within 24 Hours Yes Tresa Endo, RN, Selena Batten PLEASE NOTE: All timestamps contained within this report are represented as [place] Standard Time. CONFIDENTIALTY NOTICE: This fax transmission is intended only for the addressee. It contains information that is legally privileged, confidential or otherwise protected from use or disclosure. If you are not the intended recipient, you are strictly prohibited from reviewing, disclosing, copying using or disseminating any of this information or taking any action in reliance on or regarding this information. If you have received this fax in error, please notify us immediately by telephone so that we can arrange for its return to [place]. Phone: [phone], Toll-Free: [phone], Fax:  [phone] Page: 2 of 2 Call Id: [id number] Final Disposition 06/30/2022 2:55:23 PM See PCP within 24 Hours Yes Tresa Endo, RN, Tami Lin Disagree/Comply Comply Caller Understands Yes PreDisposition Did not know what to do Care Advice Given Per Guideline SEE PCP WITHIN 24 HOURS: * IF OFFICE WILL BE CLOSED: You need to be seen within the next 24 hours. A clinic or an urgent care center is often a good source of care if your doctor's office is closed or you can't get an appointment. ANTIBIOTIC OINTMENT FOR REDNESS OR INFECTION OF A TICK BITE: * Put a small amount of antibiotic ointment on the area 3 times per day. CALL BACK IF: * Fever occurs * You become worse CARE ADVICE given per Tick Bites (Adult) guideline. Referrals [facility] [place] - UC

## 2022-08-28 ENCOUNTER — Encounter: Payer: Self-pay | Admitting: Family Medicine

## 2022-08-29 ENCOUNTER — Ambulatory Visit (INDEPENDENT_AMBULATORY_CARE_PROVIDER_SITE_OTHER): Payer: Commercial Managed Care - HMO | Admitting: Physician Assistant

## 2022-08-29 ENCOUNTER — Encounter: Payer: Self-pay | Admitting: Physician Assistant

## 2022-08-29 VITALS — BP 132/84 | HR 78 | Temp 98.6°F | Resp 20 | Ht 64.0 in | Wt 159.0 lb

## 2022-08-29 DIAGNOSIS — R220 Localized swelling, mass and lump, head: Secondary | ICD-10-CM

## 2022-08-29 DIAGNOSIS — U071 COVID-19: Secondary | ICD-10-CM | POA: Diagnosis not present

## 2022-08-29 DIAGNOSIS — J069 Acute upper respiratory infection, unspecified: Secondary | ICD-10-CM | POA: Diagnosis not present

## 2022-08-29 NOTE — Progress Notes (Signed)
Established patient visit   Patient: Maria Lucas   DOB: 1960-03-21   62 y.o. Female  MRN: [id number] Visit Date: 08/29/2022  Today's healthcare provider: Alfredia Ferguson, PA-C   Cc. Covid 19, lip swelling  Subjective    HPI Discussed the use of AI scribe software for clinical note transcription with the patient, who gave verbal consent to proceed.  History of Present Illness   The patient, who tested positive for COVID-19 this week, presents with symptoms that started on Sunday. The most severe symptom is a sore throat, described as redness especially around the edges. Reports fever with tmax 101 but normal today. The patient also experienced an unusual swelling of the lips yesterday, described as feeling like they had received a shot of lidocaine. This swelling lasted mainly through the morning and was accompanied by itchiness. The patient took Benadryl, which seemed to reduce the swelling.  The patient had tried a new immune booster juice and a combination of Motrin and Tylenol for the first time the night before the lip swelling occurred. However, the patient had taken these ingredients separately before without any adverse reactions.      Medications: Outpatient Medications Prior to Visit  Medication Sig   aspirin 81 MG tablet Take 81 mg by mouth daily.   atorvastatin (LIPITOR) 10 MG tablet Take 1 tablet (10 mg total) by mouth at bedtime.   Calcium-Magnesium-Vitamin D (CITRACAL CALCIUM+D PO) Take 1 tablet by mouth daily.   Cholecalciferol (VITAMIN D) 2000 units CAPS Take 1 capsule (2,000 Units total) by mouth daily.   Coenzyme Q10 10 MG capsule Take 1 capsule by mouth daily.   conjugated estrogens (PREMARIN) vaginal cream Place 1 applicator vaginally daily.   Flaxseed Oil OIL Take 1,400 mg by mouth daily.   latanoprost (XALATAN) 0.005 % ophthalmic solution Place 1 drop into both eyes at bedtime.   multivitamin-iron-minerals-folic acid (CENTRUM) chewable tablet Chew 1 tablet  by mouth daily.   Omega-3 350 MG CPDR Take 1 tablet by mouth daily.   Probiotic Product (PROBIOTIC DAILY PO) Take 1 each by mouth daily.   doxycycline (VIBRAMYCIN) 100 MG capsule Take 1 capsule (100 mg total) by mouth 2 (two) times daily. (Patient not taking: Reported on 08/29/2022)   IRON, FERROUS GLUCONATE, PO Take 65 mg by mouth daily. (Patient not taking: Reported on 08/29/2022)   No facility-administered medications prior to visit.    Review of Systems  Constitutional:  Positive for fatigue. Negative for fever.  HENT:  Positive for congestion and sore throat.   Respiratory:  Negative for cough and shortness of breath.   Cardiovascular:  Negative for chest pain and leg swelling.  Gastrointestinal:  Negative for abdominal pain.  Neurological:  Negative for dizziness and headaches.       Objective    BP 132/84 (BP Location: Left Arm, Patient Position: Sitting, Cuff Size: Normal)   Pulse 78   Temp 98.6 F (37 C) (Oral)   Resp 20   Ht 5\' 4"  (1.626 m)   Wt 159 lb (72.1 kg)   LMP 11/05/2012   SpO2 97%   BMI 27.29 kg/m    Physical Exam Constitutional:      General: She is awake.     Appearance: She is well-developed.  HENT:     Head: Normocephalic.     Right Ear: Tympanic membrane normal.     Left Ear: Tympanic membrane normal.     Mouth/Throat:     Pharynx: Posterior oropharyngeal  erythema present. No oropharyngeal exudate.  Eyes:     Conjunctiva/sclera: Conjunctivae normal.  Neck:     Comments: Shotty b/l cervical lymphadenopathy  Cardiovascular:     Rate and Rhythm: Normal rate and regular rhythm.     Heart sounds: Normal heart sounds.  Pulmonary:     Effort: Pulmonary effort is normal.     Breath sounds: Normal breath sounds.  Skin:    General: Skin is warm.  Neurological:     Mental Status: She is alert and oriented to person, place, and time.  Psychiatric:        Attention and Perception: Attention normal.        Mood and Affect: Mood normal.         Speech: Speech normal.        Behavior: Behavior is cooperative.      No results found for any visits on 08/29/22.  Assessment & Plan    1. Upper respiratory tract infection due to COVID-19 virus First-time infection with symptoms of sore throat, fever, and lip swelling. No shortness of breath or severe symptoms. -Continue rest and symptomatic treatment with over-the-counter medications as needed.  2. Lip swelling  Possible Drug Reaction: Lip swelling occurred after taking a combination of Motrin and Tylenol for the first time. No clear causative agent identified. -Avoid the combination medication and monitor for recurrence of lip swelling. -Consider use of antihistamines for lip swelling if it recurs.    Return if symptoms worsen or fail to improve.      I, Alfredia Ferguson, PA-C have reviewed all documentation for this visit. The documentation on  08/29/22   for the exam, diagnosis, procedures, and orders are all accurate and complete.    Alfredia Ferguson, PA-C  [facility] at [facility] [phone] (phone) [phone] (fax)  [facility]

## 2022-08-29 NOTE — Telephone Encounter (Signed)
Called pt per Dr.Copland she need to come in the office to be seen due to the swollen  In the pt lip. Appt was made to be seen today.

## 2022-09-05 ENCOUNTER — Encounter (INDEPENDENT_AMBULATORY_CARE_PROVIDER_SITE_OTHER): Payer: Self-pay

## 2022-09-24 ENCOUNTER — Encounter: Payer: Commercial Managed Care - HMO | Admitting: Family Medicine

## 2022-11-05 ENCOUNTER — Encounter: Payer: Self-pay | Admitting: Family

## 2022-11-05 ENCOUNTER — Ambulatory Visit (INDEPENDENT_AMBULATORY_CARE_PROVIDER_SITE_OTHER): Payer: Commercial Managed Care - HMO | Admitting: Family

## 2022-11-05 ENCOUNTER — Other Ambulatory Visit: Payer: Self-pay | Admitting: Family Medicine

## 2022-11-05 VITALS — BP 138/83 | HR 84 | Temp 98.2°F | Resp 16 | Ht 64.5 in | Wt 158.0 lb

## 2022-11-05 DIAGNOSIS — E782 Mixed hyperlipidemia: Secondary | ICD-10-CM | POA: Diagnosis not present

## 2022-11-05 DIAGNOSIS — R011 Cardiac murmur, unspecified: Secondary | ICD-10-CM

## 2022-11-05 DIAGNOSIS — Z1231 Encounter for screening mammogram for malignant neoplasm of breast: Secondary | ICD-10-CM

## 2022-11-05 DIAGNOSIS — Z0000 Encounter for general adult medical examination without abnormal findings: Secondary | ICD-10-CM

## 2022-11-05 DIAGNOSIS — R739 Hyperglycemia, unspecified: Secondary | ICD-10-CM

## 2022-11-05 DIAGNOSIS — L659 Nonscarring hair loss, unspecified: Secondary | ICD-10-CM | POA: Diagnosis not present

## 2022-11-05 DIAGNOSIS — Z23 Encounter for immunization: Secondary | ICD-10-CM

## 2022-11-05 DIAGNOSIS — N952 Postmenopausal atrophic vaginitis: Secondary | ICD-10-CM

## 2022-11-05 DIAGNOSIS — H409 Unspecified glaucoma: Secondary | ICD-10-CM

## 2022-11-05 LAB — COMPREHENSIVE METABOLIC PANEL
ALT: 16 U/L (ref 0–35)
AST: 22 U/L (ref 0–37)
Albumin: 4.4 g/dL (ref 3.5–5.2)
Alkaline Phosphatase: 82 U/L (ref 39–117)
BUN: 13 mg/dL (ref 6–23)
CO2: 30 meq/L (ref 19–32)
Calcium: 9.8 mg/dL (ref 8.4–10.5)
Chloride: 102 meq/L (ref 96–112)
Creatinine, Ser: 0.88 mg/dL (ref 0.40–1.20)
GFR: 70.86 mL/min (ref >60.00)
Glucose, Bld: 95 mg/dL (ref 70–99)
Potassium: 4.4 meq/L (ref 3.5–5.1)
Sodium: 142 meq/L (ref 135–145)
Total Bilirubin: 0.6 mg/dL (ref 0.2–1.2)
Total Protein: 6.9 g/dL (ref 6.0–8.3)

## 2022-11-05 LAB — LIPID PANEL
Cholesterol: 196 mg/dL (ref 0–200)
HDL: 84.6 mg/dL (ref >39.00)
LDL Cholesterol: 80 mg/dL (ref 0–99)
NonHDL: 111.21
Total CHOL/HDL Ratio: 2
Triglycerides: 155 mg/dL — ABNORMAL HIGH (ref 0.0–149.0)
VLDL: 31 mg/dL (ref 0.0–40.0)

## 2022-11-05 LAB — CBC WITH DIFFERENTIAL/PLATELET
Basophils Absolute: 0 10*3/uL (ref 0.0–0.1)
Basophils Relative: 0.3 % (ref 0.0–3.0)
Eosinophils Absolute: 0.2 10*3/uL (ref 0.0–0.7)
Eosinophils Relative: 3.1 % (ref 0.0–5.0)
HCT: 41.6 % (ref 36.0–46.0)
Hemoglobin: 13.6 g/dL (ref 12.0–15.0)
Lymphocytes Relative: 29.1 % (ref 12.0–46.0)
Lymphs Abs: 1.7 10*3/uL (ref 0.7–4.0)
MCHC: 32.8 g/dL (ref 30.0–36.0)
MCV: 94.9 fL (ref 78.0–100.0)
Monocytes Absolute: 0.4 10*3/uL (ref 0.1–1.0)
Monocytes Relative: 7 % (ref 3.0–12.0)
Neutro Abs: 3.4 10*3/uL (ref 1.4–7.7)
Neutrophils Relative %: 60.5 % (ref 43.0–77.0)
Platelets: 197 10*3/uL (ref 150.0–400.0)
RBC: 4.38 Mil/uL (ref 3.87–5.11)
RDW: 12.9 % (ref 11.5–15.5)
WBC: 5.7 10*3/uL (ref 4.0–10.5)

## 2022-11-05 LAB — HEMOGLOBIN A1C: Hgb A1c MFr Bld: 5.9 % (ref 4.6–6.5)

## 2022-11-05 LAB — TSH: TSH: 2.11 u[IU]/mL (ref 0.35–5.50)

## 2022-11-05 MED ORDER — ESTROGENS CONJUGATED 0.625 MG/GM VA CREA: TOPICAL_CREAM | VAGINAL | 5 refills | Status: DC

## 2022-11-05 NOTE — Assessment & Plan Note (Signed)
Pt reports pressure is well controlled with Xalatan drops. She is followed by Dr. Burgess Estelle, Ophthalmology.

## 2022-11-05 NOTE — Assessment & Plan Note (Signed)
New. Update labs as ordered.

## 2022-11-05 NOTE — Patient Instructions (Signed)
VISIT SUMMARY:  During your visit, we discussed your concerns about hair thinning, your ongoing management of hyperlipidemia and glaucoma, and the use of Premarin for vaginal dryness. We also reviewed your general health maintenance.  YOUR PLAN:  -HAIR THINNING: You've noticed your hair thinning. This can be due to a variety of factors including hormonal changes, age, heredity, and stress. We will conduct some tests to rule out anemia and thyroid issues.  -HYPERLIPIDEMIA: Hyperlipidemia is a condition where there are high levels of fats (lipids) in the blood. You are currently taking Lipitor for this, which is helping to control your cholesterol levels. We will continue with the same dosage and monitor your cholesterol levels.  -GLAUCOMA: Glaucoma is a condition that damages your eye's optic nerve. You are currently using three prescription eye drops for this under the care of Dr. Burgess Estelle. We will continue with the same treatment.  -VAGINAL DRYNESS: You've reported improvement in vaginal dryness with the use of topical Premarin. We will refill your prescription and you should continue to apply it twice weekly.  -GENERAL HEALTH MAINTENANCE: We will order a mammogram as it is due. Remember that your colonoscopy is due in May 2025. We will also administer a tetanus vaccine today and order labs to check your kidney and liver function, cholesterol, and A1c. We will schedule a Pap smear for next year and encourage you to perform a self breast exam once a month.  INSTRUCTIONS:  Please make sure to follow the instructions for your medications and keep up with the scheduled tests and exams. If you have any questions or concerns, don't hesitate to contact us.

## 2022-11-05 NOTE — Progress Notes (Signed)
**Note Maria-Identified via Obfuscation** Subjective:     Patient ID: Maria Lucas, female    DOB: 07-Feb-1960, 62 y.o.   MRN: [id number]  Chief Complaint  Patient presents with   Annual Exam   Asthma    Asthma There is no cough. Pertinent negatives include no headaches, myalgias or weight loss. Her past medical history is significant for asthma.    Discussed the use of AI scribe software for clinical note transcription with the patient, who gave verbal consent to proceed.  History of Present Illness   The patient, with a history of hyperlipidemia and glaucoma, presents for a medication refill and reports hair thinning. She has been using vaginal estrogen twice weekly for the past year and notes an improvement in her vaginal dryness and irritation.   The patient maintains a healthy diet, mostly consuming vegetables, [place], and chicken. She has been maintaining a stable weight, although she expresses a desire to lose weight. She walks regularly for exercise but finds it difficult to lose weight, attributing it to postmenopausal changes.  The patient is due for a colonoscopy next May and has been keeping up with regular eye and dental exams. She is also due for a mammogram. She has been using Lipitor for her cholesterol, which was last checked the previous summer and was found to be very good. She also takes three prescriptions for her eyes due to glaucoma.  The patient's family medical history includes hypertension, ovarian cysts, and hyperlipidemia in her sister, who also has hemochromatosis. The patient does not smoke, drink alcohol, or use drugs. She enjoys working with flowers in her yard and watching [place] dram[place] in her free time. She has a dog and a daughter.     Patient presents today for complete physical.  Immunizations: declines flu shot,covid shot and shingrix.  tetanus due this spring Diet: reports healthy diet Exercise: walks regularly Wt Readings from Last 3 Encounters:  11/05/22 158 lb (71.7 kg)  08/29/22  159 lb (72.1 kg)  06/30/22 158 lb 11.7 oz (72 kg)  Colonoscopy: due 5/25  Pap Smear: due 8/25  Mammogram: 2022- due Vision: up to date Dental: up to date  Hyperlipidemia- maintained on atorvastatin.   Lab Results  Component Value Date   CHOL 192 09/21/2021   HDL 83.30 09/21/2021   LDLCALC 87 09/21/2021   LDLDIRECT 122.7 03/26/2012   TRIG 109.0 09/21/2021   CHOLHDL 2 09/21/2021   Hyperglycemia- last A1C 6.0  Sees Dr. Burgess Estelle from [facility] on the Xalatan drops for increased eye pressure. Lab Results  Component Value Date   HGBA1C 6.0 09/21/2021       Health Maintenance Due  Topic Date Due   COVID-19 Vaccine (3 - 2023-24 season) 10/07/2022   MAMMOGRAM  10/19/2022    Past Medical History:  Diagnosis Date   Acid reflux 10/29/2017   Allergy 04/12/2011   Anemia    menorraghia   Atrophic vaginitis 10/12/2013   Atypical chest pain 03/12/2011   Breast mass, left 03/21/2011   Cervical cancer screening 03/21/2011   Menarche at 17, 5-10 day cycles, variable flow LMP 03/06/11 G1P1 s/p SVD Fibrocystic breast by [facility] in past, last MGM 3 years ago. H/o endometrial cells in a woman over 40 once, no there abnormal paps Last pap and MGM 3 years ago.   Closed trimalleolar fracture of left ankle 02/10/2020   Complication of anesthesia    slow to wake up   Elevated antinuclear antibody (ANA) level 03/12/2011   Elevated BP 03/12/2011  Patient reports 115-130/60-80 when she checks it elsewhere She believes it is secondary to a [place] phenomenon   Glaucoma 03/12/2011   Heart murmur    History of gestational diabetes 08/18/2018   Hyperglycemia 03/12/2011   H/o gestational DM, diet controlled   Hyperlipidemia 03/12/2011   Left foot pain 01/10/2017   Trimalleoloar fracture in early 2022 now with screw, wire and plate   Osteopenia of lumbar spine 08/18/2018   Palpitations    Perennial allergic rhinitis 08/18/2018   Last Assessment & Plan:  Formatting of this note might be different from the  original. Symptoms controlled with daily loratadine.  She feels more chest tightness lately related to being outdoors more, she has no exertional symptoms when going for exercise. Presently symptom-free, consider additional medication or changing out antihistamines for better control if needed.  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     Past Surgical History:  Procedure Laterality Date   LACERATION REPAIR     age 5, dog attack, stitches in face, multipe sites right side of face   ORIF ANKLE FRACTURE Left 02/18/2020   Procedure: OPEN REDUCTION INTERNAL FIXATION (ORIF) Left trimalleolar fracture,  syndesmosis repair;  Surgeon: Toni Arthurs, MD;  Location: [facility];  Service: Orthopedics;  Laterality: Left;     Family History  Problem Relation Age of Onset   Cancer Mother        skin, bcc, scc   Heart disease Mother        arrythmia   Hyperlipidemia Mother    Melanoma Mother    Osteoporosis Mother    Heart attack Father 22       smoker/drinker   Hypertension Father    Hyperlipidemia Father    Heart disease Father 89       MI   Varicose Veins Father    Hypertension Sister    Ovarian cysts Sister    Hyperlipidemia Sister    Hemachromatosis Sister    Heart attack Brother 25   Hypertension Brother    Heart disease Brother    Hyperlipidemia Brother    Heart attack Maternal Grandmother    Hypertension Maternal Grandmother        valvular heart disease   Heart disease Maternal Grandmother    Vascular Disease Maternal Grandmother    Cancer Maternal Grandfather         skin, bcc   Glaucoma Maternal Grandfather    Stroke Paternal Grandfather    Anxiety disorder Daughter    OCD Daughter    Colon cancer Neg Hx     Social History   Socioeconomic History   Marital status: Married    Spouse name: Not on file   Number of children: Not on file   Years of education: Not on file   Highest education level: Not on file  Occupational History   Not on file  Tobacco Use   Smoking status: Never   Smokeless tobacco: Never  Substance and Sexual Activity   Alcohol use: No   Drug use: No   Sexual activity: Yes  Partners: Male  Other Topics Concern   Not on file  Social History Narrative   Homemaker   One daughter   Married   Enjoys gardening, [place]   Has one dog   Social Determinants of Health   Financial Resource Strain: Not on file  Food Insecurity: Not on file  Transportation Needs: Not on file  Physical Activity: Insufficiently Active (08/18/2018)   Received from [facility] visits prior to 04/07/2022., [facility] visits prior to 04/07/2022.   Exercise Vital Sign    Days of Exercise per Week: 3 days    Minutes of Exercise per Session: 30 min  Stress: Not on file  Social Connections: Not on file  Intimate Partner Violence: Not on file    Outpatient Medications Prior to Visit  Medication Sig Dispense Refill   aspirin 81 MG tablet Take 81 mg by mouth daily.     atorvastatin (LIPITOR) 10 MG tablet Take 1 tablet (10 mg total) by mouth at bedtime. 90 tablet 3   Calcium-Magnesium-Vitamin D (CITRACAL CALCIUM+D PO) Take 1 tablet by mouth daily.     Cholecalciferol (DIALYVITE VITAMIN D 5000 PO) Take by mouth.     Coenzyme Q10 10 MG capsule Take 1 capsule by mouth daily.     Flaxseed Oil OIL Take 1,400 mg by mouth daily.     latanoprost (XALATAN) 0.005 % ophthalmic solution Place 1 drop into both eyes at bedtime.     multivitamin-iron-minerals-folic acid (CENTRUM) chewable tablet Chew 1 tablet by  mouth daily.     Omega-3 Fatty Acids (OMEGA 3 500 PO) Take by mouth.     Probiotic Product (PROBIOTIC DAILY PO) Take 1 each by mouth daily.     conjugated estrogens (PREMARIN) vaginal cream Place 1 applicator vaginally daily.     estradiol (CLIMARA - DOSED IN MG/24 HR) 0.1 mg/24hr patch Place 0.1 mg onto the skin once a week.     Cholecalciferol (VITAMIN D) 2000 units CAPS Take 1 capsule (2,000 Units total) by mouth daily. 30 capsule    doxycycline (VIBRAMYCIN) 100 MG capsule Take 1 capsule (100 mg total) by mouth 2 (two) times daily. (Patient not taking: Reported on 08/29/2022) 14 capsule 0   IRON, FERROUS GLUCONATE, PO Take 65 mg by mouth daily. (Patient not taking: Reported on 08/29/2022)     Omega-3 350 MG CPDR Take 1 tablet by mouth daily.     No facility-administered medications prior to visit.    Allergies  Allergen Reactions   Penicillins Rash   Clindamycin Hcl Rash    Review of Systems  Constitutional:  Negative for weight loss.  HENT:  Negative for congestion and hearing loss.   Eyes:  Negative for blurred vision.  Respiratory:  Negative for cough.   Cardiovascular:  Negative for leg swelling.  Gastrointestinal:  Negative for constipation and diarrhea.  Genitourinary:  Negative for dysuria and hematuria.  Musculoskeletal:  Negative for joint pain and myalgias.  Skin:  Negative for rash.  Neurological:  Negative for headaches.  Psychiatric/Behavioral:         Denies depression/anxiety       Objective:    Physical Exam   BP 138/83 (BP Location: Right Arm, Patient Position: Sitting, Cuff Size: Small)   Pulse 84   Temp 98.2 F (36.8 C) (Oral)   Resp 16   Ht 5' 4.5" (1.638 m)   Wt 158 lb (71.7 kg)   LMP 11/05/2012   SpO2 100%  BMI 26.70 kg/m  Wt Readings from Last 3 Encounters:  11/05/22 158 lb (71.7 kg)  08/29/22 159 lb (72.1 kg)  06/30/22 158 lb 11.7 oz (72 kg)  Physical Exam  Constitutional: She is oriented to person, place, and time. She appears  well-developed and well-nourished. No distress.  HENT:  Head: Normocephalic and atraumatic.  Right Ear: Tympanic membrane and ear canal normal.  Left Ear: Tympanic membrane and ear canal normal.  Mouth/Throat: Oropharynx is clear and moist.  Eyes: Pupils are equal, round, and reactive to light. No scleral icterus.  Neck: Normal range of motion. No thyromegaly present.  Cardiovascular: Normal rate and regular rhythm.   No murmur heard. Pulmonary/Chest: Effort normal and breath sounds normal. No respiratory distress. He has no wheezes. She has no rales. She exhibits no tenderness.  Abdominal: Soft. Bowel sounds are normal. She exhibits no distension and no mass. There is no tenderness. There is no rebound and no guarding.  Musculoskeletal: She exhibits no edema.  Lymphadenopathy:    She has no cervical adenopathy.  Neurological: She is alert and oriented to person, place, and time. She has normal patellar reflexes. She exhibits normal muscle tone. Coordination normal.  Skin: Skin is warm and dry.  Psychiatric: She has a normal mood and affect. Her behavior is normal. Judgment and thought content normal.  Breasts: Examined lying Right: Without masses, retractions, discharge or axillary adenopathy.  Left: Without masses, retractions, discharge or axillary adenopathy.  Pelvic: deferred        Assessment & Plan:        Assessment & Plan:   Problem List Items Addressed This Visit       Unprioritized   Preventative health care     -Order mammogram as it is due. -would like to update Td today. Declines all other vaccines.  -Remind patient of upcoming colonoscopy due in May 2025. -Administer tetanus vaccine today. -Order labs to check kidney and liver function, cholesterol, and A1c. -Schedule Pap smear for next year. -Encourage self breast exam once a month.      Hyperlipidemia - Primary     Patient is on Lipitor for cholesterol management. Last cholesterol check showed good  control. -Continue Lipitor at current dose. -Order lipid panel to monitor cholesterol levels.      Relevant Orders   Lipid panel   Hyperglycemia   Relevant Orders   HgB A1c   Comp Met (CMET)   Heart murmur    Not appreciated on today's exam. Normal valves noted on 2D echo performed 2023.      Hair loss    New. Update labs as ordered.      Relevant Orders   TSH   CBC w/Diff   Glaucoma    Pt reports pressure is well controlled with Xalatan drops. She is followed by Dr. Burgess Estelle, Ophthalmology.       Atrophic vaginitis    Stable with topical application twice weekly of estrace cram.       Relevant Medications   conjugated estrogens (PREMARIN) vaginal cream   Other Visit Diagnoses     Breast cancer screening by mammogram       Relevant Orders   MM 3D SCREENING MAMMOGRAM BILATERAL BREAST       I have discontinued Maria Lucas's Omega-3, (IRON, FERROUS GLUCONATE, PO), Vitamin D, doxycycline, and estradiol. I have also changed her conjugated estrogens. Additionally, I am having her maintain her Flaxseed Oil, multivitamin-iron-minerals-folic acid, Calcium-Magnesium-Vitamin D (CITRACAL CALCIUM+D PO), latanoprost, aspirin, Probiotic Product (  PROBIOTIC DAILY PO), Coenzyme Q10, atorvastatin, Cholecalciferol (DIALYVITE VITAMIN D 5000 PO), and Omega-3 Fatty Acids (OMEGA 3 500 PO).  Meds ordered this encounter  Medications   conjugated estrogens (PREMARIN) vaginal cream    Sig: Apply 0.5 mg topically twice weekly    Dispense:  42.5 g    Refill:  5    Order Specific Question:   Supervising Provider    Answer:   Danise Edge A [[id number]]

## 2022-11-05 NOTE — Assessment & Plan Note (Addendum)
Not appreciated on today's exam. Normal valves noted on 2D echo performed 2023.

## 2022-11-05 NOTE — Assessment & Plan Note (Signed)
Stable with topical application twice weekly of estrace cram.

## 2022-11-05 NOTE — Assessment & Plan Note (Signed)
  Patient is on Lipitor for cholesterol management. Last cholesterol check showed good control. -Continue Lipitor at current dose. -Order lipid panel to monitor cholesterol levels.

## 2022-11-05 NOTE — Assessment & Plan Note (Addendum)
-  Order mammogram as it is due. -would like to update Td today. Declines all other vaccines.  -Remind patient of upcoming colonoscopy due in May 2025. -Administer tetanus vaccine today. -Order labs to check kidney and liver function, cholesterol, and A1c. -Schedule Pap smear for next year. -Encourage self breast exam once a month.

## 2022-11-12 ENCOUNTER — Ambulatory Visit (HOSPITAL_BASED_OUTPATIENT_CLINIC_OR_DEPARTMENT_OTHER)
Admission: RE | Admit: 2022-11-12 | Discharge: 2022-11-12 | Disposition: A | Discharge status: Home / Self Care | Payer: Commercial Managed Care - HMO | Source: Ambulatory Visit | Attending: Family | Admitting: Family

## 2022-11-12 ENCOUNTER — Encounter (HOSPITAL_BASED_OUTPATIENT_CLINIC_OR_DEPARTMENT_OTHER): Payer: Self-pay

## 2022-11-12 DIAGNOSIS — Z1231 Encounter for screening mammogram for malignant neoplasm of breast: Secondary | ICD-10-CM | POA: Diagnosis present

## 2022-12-06 ENCOUNTER — Telehealth: Payer: Self-pay | Admitting: Family Medicine

## 2022-12-06 NOTE — Telephone Encounter (Signed)
Pt called stating the vaginal cream that was sent in for her wont be covered by insurance and needs the following sent in instead:  estradiol (ESTRACE VAGINAL) 0.1 MG/GM vaginal cream [[id number]]  DISCONTINUED

## 2022-12-07 ENCOUNTER — Other Ambulatory Visit (HOSPITAL_BASED_OUTPATIENT_CLINIC_OR_DEPARTMENT_OTHER): Payer: Self-pay

## 2022-12-07 ENCOUNTER — Other Ambulatory Visit: Payer: Self-pay | Admitting: Family Medicine

## 2022-12-07 MED ORDER — ESTRADIOL 0.1 MG/GM VA CREA
TOPICAL_CREAM | VAGINAL | 5 refills | Status: AC
  Filled 2022-12-07: qty 42.5, 90d supply, fill #0

## 2022-12-07 NOTE — Telephone Encounter (Signed)
Patient notified

## 2023-01-21 ENCOUNTER — Other Ambulatory Visit: Payer: Self-pay | Admitting: Family Medicine

## 2023-03-20 DIAGNOSIS — H1132 Conjunctival hemorrhage, left eye: Secondary | ICD-10-CM | POA: Diagnosis not present

## 2023-03-26 ENCOUNTER — Other Ambulatory Visit (HOSPITAL_BASED_OUTPATIENT_CLINIC_OR_DEPARTMENT_OTHER): Payer: Self-pay

## 2023-03-26 ENCOUNTER — Ambulatory Visit: Payer: Commercial Managed Care - HMO | Admitting: Family

## 2023-03-26 VITALS — BP 160/83 | HR 109 | Temp 98.8°F | Resp 16 | Ht 64.5 in | Wt 161.0 lb

## 2023-03-26 DIAGNOSIS — I8392 Asymptomatic varicose veins of left lower extremity: Secondary | ICD-10-CM | POA: Diagnosis not present

## 2023-03-26 DIAGNOSIS — I10 Essential (primary) hypertension: Secondary | ICD-10-CM

## 2023-03-26 MED ORDER — LISINOPRIL 10 MG PO TABS
10.0000 mg | ORAL_TABLET | Freq: Every day | ORAL | 0 refills | Status: DC
  Filled 2023-03-26: qty 30, 30d supply, fill #0
  Filled 2023-04-18 – 2023-04-19 (×2): qty 30, 30d supply, fill #1

## 2023-03-26 NOTE — Progress Notes (Unsigned)
**Note Maria-Identified via Obfuscation**  Subjective:     Patient ID: Maria Lucas, female    DOB: 1960/09/07, 63 y.o.   MRN: [id number]  Chief Complaint  Patient presents with   Tingling    Patient complains of tingling on upper part of left leg    HPI  Discussed the use of AI scribe software for clinical note transcription with the patient, who gave verbal consent to proceed.  History of Present Illness   Maria Lucas is a 63 year old female with varicose veins who presents with worsening symptoms and swelling.  She experiences a burning, tingling sensation in her leg, described as feeling like a 'root', associated with visible varicose veins and spider veins, particularly over the kneecap. These symptoms have become more prominent recently and are exacerbated at night, making it difficult for her to sleep on the affected side. Tingling sometimes extends to her fingers, and symptoms also occur during the day. Her feet are slightly swollen, which she attributes to the varicose veins.  She recalls a previous consultation with a vascular specialist after significant swelling in her leg following a bike ride. Although the swelling subsided by the time of the appointment, the varicose veins have become more pronounced since then. She has a history of a broken ankle on the same leg, which she refers to as her 'trouble leg'.  She has been monitoring her blood pressure at home, noting a reading of 160/83, consistent with the reading taken during the visit. She is concerned about the potential impact of high blood pressure on her varicose veins.  She engages in regular walking in her neighborhood, although her routine can be disrupted by bad weather or other factors.      160/83  BP Readings from Last 3 Encounters:  03/26/23 (!) 160/83  11/05/22 138/83  08/29/22 132/84     Health Maintenance Due  Topic Date Due   COVID-19 Vaccine (3 - 2024-25 season) 10/07/2022   Colonoscopy  06/20/2023    Past Medical History:   Diagnosis Date   Acid reflux 10/29/2017   Allergy 04/12/2011   Anemia    menorraghia   Atrophic vaginitis 10/12/2013   Atypical chest pain 03/12/2011   Breast mass, left 03/21/2011   Cervical cancer screening 03/21/2011   Menarche at 17, 5-10 day cycles, variable flow LMP 03/06/11 G1P1 s/p SVD Fibrocystic breast by [facility] in past, last MGM 3 years ago. H/o endometrial cells in a woman over 40 once, no there abnormal paps Last pap and MGM 3 years ago.   Closed trimalleolar fracture of left ankle 02/10/2020   Complication of anesthesia    slow to wake up   Elevated antinuclear antibody (ANA) level 03/12/2011   Elevated BP 03/12/2011   Patient reports 115-130/60-80 when she checks it elsewhere She believes it is secondary to a White Coat phenomenon   Glaucoma 03/12/2011   Heart murmur    History of gestational diabetes 08/18/2018   Hyperglycemia 03/12/2011   H/o gestational DM, diet controlled   Hyperlipidemia 03/12/2011   Left foot pain 01/10/2017   Trimalleoloar fracture in early 2022 now with screw, wire and plate   Osteopenia of lumbar spine 08/18/2018   Palpitations    Perennial allergic rhinitis 08/18/2018   Last Assessment & Plan:  Formatting of this note might be different from the original. Symptoms controlled with daily loratadine.  She feels more chest tightness lately related to being outdoors more, she has no exertional symptoms when going for exercise. Presently symptom-free, consider additional  medication or changing out antihistamines for better control if needed.  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      Past Surgical History:  Procedure Laterality Date   LACERATION REPAIR     age 83, dog attack, stitches in face, multipe sites right side of face   ORIF ANKLE FRACTURE Left 02/18/2020   Procedure: OPEN REDUCTION INTERNAL FIXATION (ORIF) Left trimalleolar fracture,  syndesmosis repair;  Surgeon: Toni Arthurs, MD;  Location: [facility];  Service: Orthopedics;  Laterality: Left;     Family History  Problem Relation Age of Onset   Cancer Mother        skin, bcc, scc   Heart disease Mother        arrythmia   Hyperlipidemia Mother    Melanoma Mother    Osteoporosis Mother    Heart attack Father 22       smoker/drinker   Hypertension Father    Hyperlipidemia Father    Heart disease Father 107       MI   Varicose Veins Father    Hypertension Sister    Ovarian cysts Sister    Hyperlipidemia Sister    Hemachromatosis Sister    Heart attack Brother 47   Hypertension Brother    Heart disease Brother    Hyperlipidemia Brother    Heart attack Maternal Grandmother    Hypertension Maternal Grandmother        valvular heart disease   Heart disease Maternal Grandmother    Vascular Disease Maternal Grandmother    Cancer Maternal Grandfather        skin, bcc   Glaucoma Maternal Grandfather    Stroke Paternal Grandfather    Anxiety disorder Daughter    OCD Daughter    Colon cancer Neg Hx     Social History   Socioeconomic History   Marital status: Married    Spouse name: Not on file   Number of children: Not on file   Years of education: Not on file   Highest education level: 11th grade  Occupational History   Not on file  Tobacco Use   Smoking status: Never   Smokeless tobacco: Never  Substance and Sexual Activity   Alcohol use: No   Drug use: No   Sexual activity: Yes    Partners: Male  Other Topics Concern   Not on file  Social History Narrative   Homemaker   One daughter   Married   Enjoys gardening, [place]   Has one dog   Social  Drivers of Corporate investment banker Strain: Low Risk  (03/26/2023)   Overall Financial Resource Strain (CARDIA)    Difficulty of Paying Living Expenses: Not very hard  Food Insecurity: No Food Insecurity (03/26/2023)   Hunger Vital Sign    Worried About Running Out of Food in the Last Year: Never true    Ran Out of Food in the Last Year: Never true  Transportation Needs: No Transportation Needs (03/26/2023)   PRAPARE - Administrator, Civil Service (Medical): No  Lack of Transportation (Non-Medical): No  Physical Activity: Insufficiently Active (03/26/2023)   Exercise Vital Sign    Days of Exercise per Week: 2 days    Minutes of Exercise per Session: 30 min  Stress: No Stress Concern Present (03/26/2023)   [employer] of Occupational Health - Occupational Stress Questionnaire    Feeling of Stress : Not at all  Social Connections: Unknown (03/26/2023)   Social Connection and Isolation Panel [NHANES]    Frequency of Communication with Friends and Family: Patient declined    Frequency of Social Gatherings with Friends and Family: Twice a week    Attends Religious Services: More than 4 times per year    Active Member of [employer] or Organizations: Yes    Attends Engineer, structural: More than 4 times per year    Marital Status: Married  Catering manager Violence: Not on file    Outpatient Medications Prior to Visit  Medication Sig Dispense Refill   aspirin 81 MG tablet Take 81 mg by mouth daily.     atorvastatin (LIPITOR) 10 MG tablet TAKE ONE TABLET BY MOUTH DAILY AT BEDTIME 90 tablet 1   Calcium-Magnesium-Vitamin D (CITRACAL CALCIUM+D PO) Take 1 tablet by mouth daily.     Cholecalciferol (DIALYVITE VITAMIN D 5000 PO) Take by mouth.     Coenzyme Q10 10 MG capsule Take 1 capsule by mouth daily.     estradiol (ESTRACE VAGINAL) 0.1 MG/GM vaginal cream Apply small amount vaginally 3 x weekly 42.5 g 5   Flaxseed Oil OIL Take 1,400 mg by mouth daily.      latanoprost (XALATAN) 0.005 % ophthalmic solution Place 1 drop into both eyes at bedtime.     multivitamin-iron-minerals-folic acid (CENTRUM) chewable tablet Chew 1 tablet by mouth daily.     Omega-3 Fatty Acids (OMEGA 3 500 PO) Take by mouth.     Probiotic Product (PROBIOTIC DAILY PO) Take 1 each by mouth daily.     conjugated estrogens (PREMARIN) vaginal cream Apply 0.5 mg topically twice weekly 42.5 g 5   No facility-administered medications prior to visit.    Allergies  Allergen Reactions   Penicillins Rash   Clindamycin Hcl Rash    ROS     Objective:    Physical Exam Constitutional:      General: She is not in acute distress.    Appearance: Normal appearance. She is well-developed.  HENT:     Head: Normocephalic and atraumatic.     Right Ear: Tympanic membrane, ear canal and external ear normal.     Left Ear: Tympanic membrane, ear canal and external ear normal.  Eyes:     General: No scleral icterus. Neck:     Thyroid: No thyromegaly.  Cardiovascular:     Rate and Rhythm: Normal rate and regular rhythm.     Heart sounds: Normal heart sounds. No murmur heard.    Comments: Varicose vein noted left leg running over the let knee cap. Multiple spider veins noted left lateral thigh Pulmonary:     Effort: Pulmonary effort is normal. No respiratory distress.     Breath sounds: Normal breath sounds. No wheezing.  Musculoskeletal:     Cervical back: Neck supple.  Skin:    General: Skin is warm and dry.  Neurological:     Mental Status: She is alert and oriented to person, place, and time.  Psychiatric:        Mood and Affect: Mood normal.        Behavior:  Behavior normal.        Thought Content: Thought content normal.        Judgment: Judgment normal.      BP (!) 160/83   Pulse (!) 109   Temp 98.8 F (37.1 C) (Oral)   Resp 16   Ht 5' 4.5" (1.638 m)   Wt 161 lb (73 kg)   LMP 11/05/2012   SpO2 100%   BMI 27.21 kg/m  Wt Readings from Last 3 Encounters:   03/26/23 161 lb (73 kg)  11/05/22 158 lb (71.7 kg)  08/29/22 159 lb (72.1 kg)       Assessment & Plan:   Problem List Items Addressed This Visit       Unprioritized   Varicose veins of left lower extremity - Primary    Patient reports discomfort and cosmetic concerns related to varicose veins. Discussed the pathophysiology of varicose veins and the potential treatment options. -Refer to vascular specialist for further evaluation and potential treatment.       Relevant Medications   lisinopril (ZESTRIL) 10 MG tablet   Other Relevant Orders   Ambulatory referral to Vascular Surgery   Primary hypertension   Plan to add lisinopril 10mg  once daily if home bp >140/90 and then follow up in 2 weeks for bp recheck.       Relevant Medications   lisinopril (ZESTRIL) 10 MG tablet    I have discontinued Zanasia Ausmus's conjugated estrogens. I am also having her start on lisinopril. Additionally, I am having her maintain her Flaxseed Oil, multivitamin-iron-minerals-folic acid, Calcium-Magnesium-Vitamin D (CITRACAL CALCIUM+D PO), latanoprost, aspirin, Probiotic Product (PROBIOTIC DAILY PO), Coenzyme Q10, Cholecalciferol (DIALYVITE VITAMIN D 5000 PO), Omega-3 Fatty Acids (OMEGA 3 500 PO), estradiol, and atorvastatin.  Meds ordered this encounter  Medications   lisinopril (ZESTRIL) 10 MG tablet    Sig: Take 1 tablet (10 mg total) by mouth daily.    Dispense:  90 tablet    Refill:  0    Supervising Provider:   Danise Edge A [[id number]]

## 2023-03-28 DIAGNOSIS — I10 Essential (primary) hypertension: Secondary | ICD-10-CM | POA: Insufficient documentation

## 2023-03-28 NOTE — Assessment & Plan Note (Signed)
  Patient reports discomfort and cosmetic concerns related to varicose veins. Discussed the pathophysiology of varicose veins and the potential treatment options. -Refer to vascular specialist for further evaluation and potential treatment.

## 2023-03-28 NOTE — Assessment & Plan Note (Signed)
 Plan to add lisinopril 10mg  once daily if home bp >140/90 and then follow up in 2 weeks for bp recheck.

## 2023-03-28 NOTE — Patient Instructions (Signed)
 VISIT SUMMARY:  Today, we discussed your concerns about worsening varicose veins, high blood pressure, and an ear complaint. We reviewed your symptoms and developed a plan to address each issue.  YOUR PLAN:  -VARICOSE VEINS: Varicose veins are enlarged, twisted veins that can cause discomfort and cosmetic concerns. We discussed the causes and potential treatments, and I have referred you to a vascular specialist for further evaluation and possible treatment options.  -HYPERTENSION: Hypertension, or high blood pressure, can increase the risk of heart disease and other health problems. Your blood pressure readings have been elevated, so we discussed the importance of controlling it. If your blood pressure remains above 140/90 at home, you should start taking Lisinopril 10mg  daily. Please check your blood pressure at home tomorrow and begin the medication if needed. We will follow up in 2 weeks to check your blood pressure and possibly do lab work if you start the medication.  -EAR COMPLAINT: You mentioned a 'crackly' sensation in your ear, but there were no signs of infection or other issues during the examination. No further action is needed at this time.  INSTRUCTIONS:  Please check your blood pressure at home tomorrow. If it remains above 140/90, start taking Lisinopril 10mg  daily. Schedule a follow-up appointment in 2 weeks for a blood pressure check and lab work if you begin the medication. Additionally, follow up with the vascular specialist for your varicose veins as discussed.

## 2023-03-29 ENCOUNTER — Other Ambulatory Visit: Payer: Self-pay | Admitting: *Deleted

## 2023-03-29 DIAGNOSIS — I8393 Asymptomatic varicose veins of bilateral lower extremities: Secondary | ICD-10-CM

## 2023-04-02 ENCOUNTER — Ambulatory Visit (HOSPITAL_COMMUNITY)
Admission: RE | Admit: 2023-04-02 | Discharge: 2023-04-02 | Disposition: A | Discharge status: Home / Self Care | Payer: 59 | Source: Ambulatory Visit | Attending: Vascular Surgery | Admitting: Vascular Surgery

## 2023-04-02 DIAGNOSIS — I8393 Asymptomatic varicose veins of bilateral lower extremities: Secondary | ICD-10-CM

## 2023-04-03 NOTE — Progress Notes (Unsigned)
 Patient ID: [name], female   DOB: November 12, 1960, 63 y.o.   MRN: [id number]  Reason for Consult: No chief complaint on file.   Referred by Sandford Craze, NP  Subjective:     HPI  Maria Lucas is a 63 y.o. female who presents for evaluation of lower extremity *** Timeframe: *** Symptoms: *** Varicosities: *** Previous wounds: *** Previous DVT: *** In compression: ***  Past Medical History:  Diagnosis Date   Acid reflux 10/29/2017   Allergy 04/12/2011   Anemia    menorraghia   Atrophic vaginitis 10/12/2013   Atypical chest pain 03/12/2011   Breast mass, left 03/21/2011   Cervical cancer screening 03/21/2011   Menarche at 17, 5-10 day cycles, variable flow LMP 03/06/11 G1P1 s/p SVD Fibrocystic breast by [facility] in past, last MGM 3 years ago. H/o endometrial cells in a woman over 40 once, no there abnormal paps Last pap and MGM 3 years ago.   Closed trimalleolar fracture of left ankle 02/10/2020   Complication of anesthesia    slow to wake up   Elevated antinuclear antibody (ANA) level 03/12/2011   Elevated BP 03/12/2011   Patient reports 115-130/60-80 when she checks it elsewhere She believes it is secondary to a White Coat phenomenon   Glaucoma 03/12/2011   Heart murmur    History of gestational diabetes 08/18/2018   Hyperglycemia 03/12/2011   H/o gestational DM, diet controlled   Hyperlipidemia 03/12/2011   Left foot pain 01/10/2017   Trimalleoloar fracture in early 2022 now with screw, wire and plate   Osteopenia of lumbar spine 08/18/2018   Palpitations    Perennial allergic rhinitis 08/18/2018   Last Assessment & Plan:  Formatting of this note might be different from the original. Symptoms controlled with daily loratadine.  She feels more chest tightness lately related to being outdoors more, she has no exertional symptoms when going for exercise. Presently symptom-free, consider additional medication or changing out antihistamines for better control if needed.  For now we will  continue l   Perimenopausal 10/05/2012   Last menses October 2014   Preventative health care 10/12/2013   Refusal of blood transfusions as patient is Jehovah's Witness 10/12/2014   Skin lesion 03/31/2021   SOM (serous otitis media) 04/12/2011   Sun-damaged skin 10/24/2016   Tachycardia 03/21/2011   Varicose veins of lower extremities with other complications 09/07/2013   Left leg, has been evaluated by vascular surgery in past.    Family History  Problem Relation Age of Onset   Cancer Mother        skin, bcc, scc   Heart disease Mother        arrythmia   Hyperlipidemia Mother    Melanoma Mother    Osteoporosis Mother    Heart attack Father 20       smoker/drinker   Hypertension Father    Hyperlipidemia Father    Heart disease Father 54       MI   Varicose Veins Father    Hypertension Sister    Ovarian cysts Sister    Hyperlipidemia Sister    Hemachromatosis Sister    Heart attack Brother 85   Hypertension Brother    Heart disease Brother    Hyperlipidemia Brother    Heart attack Maternal Grandmother    Hypertension Maternal Grandmother        valvular heart disease   Heart disease Maternal Grandmother    Vascular Disease Maternal Grandmother    Cancer Maternal Grandfather  skin, bcc   Glaucoma Maternal Grandfather    Stroke Paternal Grandfather    Anxiety disorder Daughter    OCD Daughter    Colon cancer Neg Hx    Past Surgical History:  Procedure Laterality Date   LACERATION REPAIR     age 88, dog attack, stitches in face, multipe sites right side of face   ORIF ANKLE FRACTURE Left 02/18/2020   Procedure: OPEN REDUCTION INTERNAL FIXATION (ORIF) Left trimalleolar fracture,  syndesmosis repair;  Surgeon: Toni Arthurs, MD;  Location: [facility];  Service: Orthopedics;  Laterality: Left;     Short Social History:  Social History   Tobacco Use   Smoking status: Never   Smokeless tobacco: Never  Substance Use Topics   Alcohol use: No     Allergies  Allergen Reactions   Penicillins Rash   Clindamycin Hcl Rash    Current Outpatient Medications  Medication Sig Dispense Refill   aspirin 81 MG tablet Take 81 mg by mouth daily.     atorvastatin (LIPITOR) 10 MG tablet TAKE ONE TABLET BY MOUTH DAILY AT BEDTIME 90 tablet 1   Calcium-Magnesium-Vitamin D (CITRACAL CALCIUM+D PO) Take 1 tablet by mouth daily.     Cholecalciferol (DIALYVITE VITAMIN D 5000 PO) Take by mouth.     Coenzyme Q10 10 MG capsule Take 1 capsule by mouth daily.     estradiol (ESTRACE VAGINAL) 0.1 MG/GM vaginal cream Apply small amount vaginally 3 x weekly 42.5 g 5   Flaxseed Oil OIL Take 1,400 mg by mouth daily.     latanoprost (XALATAN) 0.005 % ophthalmic solution Place 1 drop into both eyes at bedtime.     lisinopril (ZESTRIL) 10 MG tablet Take 1 tablet (10 mg total) by mouth daily. 90 tablet 0   multivitamin-iron-minerals-folic acid (CENTRUM) chewable tablet Chew 1 tablet by mouth daily.     Omega-3 Fatty Acids (OMEGA 3 500 PO) Take by mouth.     Probiotic Product (PROBIOTIC DAILY PO) Take 1 each by mouth daily.     No current facility-administered medications for this visit.    REVIEW OF SYSTEMS  Positive for ***  All other systems were reviewed and are negative     Objective:  Objective   There were no vitals filed for this visit. There is no height or weight on file to calculate BMI.  Physical Exam General: no acute distress Cardiac: hemodynamically stable Pulm: normal work of breathing Neuro: alert, no focal deficit Extremities: *** Vascular:   Right: palpable DP, PT***  Left: palpable DP, PT***   Data: Reflux study +--------------+---------+------+-----------+------------+--------+  LEFT         Reflux NoRefluxReflux TimeDiameter cmsComments                          Yes                                   +--------------+---------+------+-----------+------------+--------+  CFV          no                                               +--------------+---------+------+-----------+------------+--------+  FV mid        no                                              +--------------+---------+------+-----------+------------+--------+  Popliteal    no                                              +--------------+---------+------+-----------+------------+--------+  GSV at SFJ    no                            .399              +--------------+---------+------+-----------+------------+--------+  GSV prox thighno                            .287              +--------------+---------+------+-----------+------------+--------+  GSV mid thigh no                            .260              +--------------+---------+------+-----------+------------+--------+  GSV dist thighno                            .253              +--------------+---------+------+-----------+------------+--------+  GSV at knee   no                            .226              +--------------+---------+------+-----------+------------+--------+  GSV prox calf           yes    >500 ms      .255              +--------------+---------+------+-----------+------------+--------+  GSV mid calf            yes    >500 ms      .191              +--------------+---------+------+-----------+------------+--------+  SSV Pop Fossa no                                              +--------------+---------+------+-----------+------------+--------+  SSV prox calf no                                              +--------------+---------+------+-----------+------------+--------+   Echo reviewed  CMP reviewed, Cr. 0.88     Assessment/Plan:     Maria Lucas is a 62 y.o. female with chronic venous insufficiency with C*** disease and reflux noted in *** I explained the foundation of CVI treatment of compression and elevation I recommended medical grade graduated compression  stockings and intermittent leg elevation We also discussed that many patients find symptom improvement with exercise and if they have access to a pool should attempt water aerobics.  Plan to follow up in 3 months with Dr. Myra Gianotti or Dr. Randie Heinz with a *** reflux study in order to complete bilateral imaging      Daria Pastures MD [facility]

## 2023-04-05 ENCOUNTER — Ambulatory Visit: Payer: 59 | Admitting: Vascular Surgery

## 2023-04-05 ENCOUNTER — Encounter: Payer: Self-pay | Admitting: Vascular Surgery

## 2023-04-05 VITALS — BP 141/84 | HR 85 | Temp 98.1°F | Resp 20 | Ht 64.5 in | Wt 160.0 lb

## 2023-04-05 DIAGNOSIS — I872 Venous insufficiency (chronic) (peripheral): Secondary | ICD-10-CM | POA: Diagnosis not present

## 2023-04-10 ENCOUNTER — Encounter: Payer: Self-pay | Admitting: Family Medicine

## 2023-04-10 ENCOUNTER — Ambulatory Visit (INDEPENDENT_AMBULATORY_CARE_PROVIDER_SITE_OTHER): Payer: 59

## 2023-04-10 ENCOUNTER — Other Ambulatory Visit (INDEPENDENT_AMBULATORY_CARE_PROVIDER_SITE_OTHER)

## 2023-04-10 VITALS — BP 130/80

## 2023-04-10 DIAGNOSIS — I10 Essential (primary) hypertension: Secondary | ICD-10-CM

## 2023-04-10 LAB — BASIC METABOLIC PANEL
BUN: 17 mg/dL (ref 6–23)
CO2: 31 meq/L (ref 19–32)
Calcium: 9.7 mg/dL (ref 8.4–10.5)
Chloride: 102 meq/L (ref 96–112)
Creatinine, Ser: 0.81 mg/dL (ref 0.40–1.20)
GFR: 78.04 mL/min (ref >60.00)
Glucose, Bld: 106 mg/dL — ABNORMAL HIGH (ref 70–99)
Potassium: 3.8 meq/L (ref 3.5–5.1)
Sodium: 140 meq/L (ref 135–145)

## 2023-04-10 NOTE — Progress Notes (Signed)
 Pt here for Blood pressure check per Melissa  Pt currently takes: Lisinopril 10 mg once daily   Pt reports compliance with medication. Pt states taking medication prior to visit  BP today @ = 130/80  HR = 87  Pt advised per  Melissa to continue medication.Check BMP today and follow up with Abner Greenspan in 3 months.

## 2023-04-18 ENCOUNTER — Other Ambulatory Visit (HOSPITAL_BASED_OUTPATIENT_CLINIC_OR_DEPARTMENT_OTHER): Payer: Self-pay

## 2023-04-19 ENCOUNTER — Other Ambulatory Visit: Payer: Self-pay

## 2023-04-22 ENCOUNTER — Other Ambulatory Visit (HOSPITAL_BASED_OUTPATIENT_CLINIC_OR_DEPARTMENT_OTHER): Payer: Self-pay

## 2023-07-05 ENCOUNTER — Other Ambulatory Visit: Payer: Self-pay | Admitting: Family Medicine

## 2023-07-29 ENCOUNTER — Other Ambulatory Visit: Payer: Self-pay | Admitting: Family

## 2023-09-30 ENCOUNTER — Other Ambulatory Visit: Payer: Self-pay | Admitting: Family Medicine

## 2023-10-16 DIAGNOSIS — H0100A Unspecified blepharitis right eye, upper and lower eyelids: Secondary | ICD-10-CM | POA: Diagnosis not present

## 2023-10-16 DIAGNOSIS — H40023 Open angle with borderline findings, high risk, bilateral: Secondary | ICD-10-CM | POA: Diagnosis not present

## 2023-10-16 DIAGNOSIS — H01004 Unspecified blepharitis left upper eyelid: Secondary | ICD-10-CM | POA: Diagnosis not present

## 2023-10-28 ENCOUNTER — Other Ambulatory Visit: Payer: Self-pay | Admitting: Family Medicine

## 2023-10-29 ENCOUNTER — Encounter: Payer: Self-pay | Admitting: Internal Medicine

## 2023-11-10 NOTE — Assessment & Plan Note (Signed)
 [id number] acceptable, minimize simple carbs. Increase exercise as tolerated.

## 2023-11-10 NOTE — Assessment & Plan Note (Addendum)
 Patient encouraged to maintain heart healthy diet, regular exercise, adequate sleep. Consider daily probiotics. Take medications as prescribed.Colonoscopy: Last completed on 06/19/2013. Normal results. Repeat in 2025.   Dexxa: Last completed on 10/18/2020. -The probability of a major osteoporotic fracture is 14.3% within the next 10 years. -The probability of a hip fracture is 1.5% within the next ten years.  Repeat 10/2022 to 2027  Pap Smear: Last completed on 09/19/2020. Normal results. Repeat in 3-5 years.    Mammogram: Last completed on 11/2022. No mammographic evidence of malignancy. Repeat in 2025.

## 2023-11-10 NOTE — Assessment & Plan Note (Signed)
 Encouraged to get adequate exercise, calcium and vitamin d intake

## 2023-11-10 NOTE — Assessment & Plan Note (Signed)
 Encourage heart healthy diet such as MIND or DASH diet, increase exercise, avoid trans fats, simple carbohydrates and processed foods, consider a krill or fish or flaxseed oil cap daily. Tolerating Atorvastatin

## 2023-11-10 NOTE — Assessment & Plan Note (Signed)
 Well controlled, no changes to meds. Encouraged heart healthy diet such as the DASH diet and exercise as tolerated.

## 2023-11-11 ENCOUNTER — Encounter: Payer: Self-pay | Admitting: Family Medicine

## 2023-11-11 ENCOUNTER — Other Ambulatory Visit (HOSPITAL_COMMUNITY)
Admission: RE | Admit: 2023-11-11 | Discharge: 2023-11-11 | Disposition: A | Discharge status: Home / Self Care | Source: Ambulatory Visit | Attending: Family Medicine | Admitting: Family Medicine

## 2023-11-11 ENCOUNTER — Ambulatory Visit (INDEPENDENT_AMBULATORY_CARE_PROVIDER_SITE_OTHER): Payer: Commercial Managed Care - HMO | Admitting: Family Medicine

## 2023-11-11 VITALS — BP 131/63 | HR 80 | Temp 98.2°F | Resp 16 | Ht 64.5 in | Wt 161.0 lb

## 2023-11-11 DIAGNOSIS — I10 Essential (primary) hypertension: Secondary | ICD-10-CM

## 2023-11-11 DIAGNOSIS — R739 Hyperglycemia, unspecified: Secondary | ICD-10-CM | POA: Diagnosis not present

## 2023-11-11 DIAGNOSIS — E2839 Other primary ovarian failure: Secondary | ICD-10-CM

## 2023-11-11 DIAGNOSIS — Z0001 Encounter for general adult medical examination with abnormal findings: Secondary | ICD-10-CM

## 2023-11-11 DIAGNOSIS — E782 Mixed hyperlipidemia: Secondary | ICD-10-CM | POA: Diagnosis not present

## 2023-11-11 DIAGNOSIS — M8588 Other specified disorders of bone density and structure, other site: Secondary | ICD-10-CM

## 2023-11-11 DIAGNOSIS — Z780 Asymptomatic menopausal state: Secondary | ICD-10-CM

## 2023-11-11 DIAGNOSIS — Z1231 Encounter for screening mammogram for malignant neoplasm of breast: Secondary | ICD-10-CM

## 2023-11-11 DIAGNOSIS — Z0000 Encounter for general adult medical examination without abnormal findings: Secondary | ICD-10-CM

## 2023-11-11 DIAGNOSIS — Z124 Encounter for screening for malignant neoplasm of cervix: Secondary | ICD-10-CM

## 2023-11-11 DIAGNOSIS — Z1211 Encounter for screening for malignant neoplasm of colon: Secondary | ICD-10-CM

## 2023-11-11 LAB — COMPREHENSIVE METABOLIC PANEL WITH GFR
ALT: 16 U/L (ref 0–35)
AST: 22 U/L (ref 0–37)
Albumin: 4.6 g/dL (ref 3.5–5.2)
Alkaline Phosphatase: 73 U/L (ref 39–117)
BUN: 12 mg/dL (ref 6–23)
CO2: 29 meq/L (ref 19–32)
Calcium: 9.9 mg/dL (ref 8.4–10.5)
Chloride: 101 meq/L (ref 96–112)
Creatinine, Ser: 0.8 mg/dL (ref 0.40–1.20)
GFR: 78.88 mL/min (ref >60.00)
Glucose, Bld: 108 mg/dL — ABNORMAL HIGH (ref 70–99)
Potassium: 4.7 meq/L (ref 3.5–5.1)
Sodium: 141 meq/L (ref 135–145)
Total Bilirubin: 0.6 mg/dL (ref 0.2–1.2)
Total Protein: 6.8 g/dL (ref 6.0–8.3)

## 2023-11-11 LAB — LIPID PANEL
Cholesterol: 192 mg/dL (ref 0–200)
HDL: 76.8 mg/dL (ref >39.00)
LDL Cholesterol: 81 mg/dL (ref 0–99)
NonHDL: 115.05
Total CHOL/HDL Ratio: 2
Triglycerides: 169 mg/dL — ABNORMAL HIGH (ref 0.0–149.0)
VLDL: 33.8 mg/dL (ref 0.0–40.0)

## 2023-11-11 LAB — CBC WITH DIFFERENTIAL/PLATELET
Basophils Absolute: 0 K/uL (ref 0.0–0.1)
Basophils Relative: 0.2 % (ref 0.0–3.0)
Eosinophils Absolute: 0 K/uL (ref 0.0–0.7)
Eosinophils Relative: 0.7 % (ref 0.0–5.0)
HCT: 39.6 % (ref 36.0–46.0)
Hemoglobin: 13.1 g/dL (ref 12.0–15.0)
Lymphocytes Relative: 20.2 % (ref 12.0–46.0)
Lymphs Abs: 1.2 K/uL (ref 0.7–4.0)
MCHC: 33.2 g/dL (ref 30.0–36.0)
MCV: 94.8 fl (ref 78.0–100.0)
Monocytes Absolute: 0.3 K/uL (ref 0.1–1.0)
Monocytes Relative: 5.3 % (ref 3.0–12.0)
Neutro Abs: 4.4 K/uL (ref 1.4–7.7)
Neutrophils Relative %: 73.6 % (ref 43.0–77.0)
Platelets: 192 K/uL (ref 150.0–400.0)
RBC: 4.18 Mil/uL (ref 3.87–5.11)
RDW: 12.7 % (ref 11.5–15.5)
WBC: 6 K/uL (ref 4.0–10.5)

## 2023-11-11 LAB — TSH: TSH: 1.16 u[IU]/mL (ref 0.35–5.50)

## 2023-11-11 LAB — HEMOGLOBIN A1C: Hgb A1c MFr Bld: 6.2 % (ref 4.6–6.5)

## 2023-11-11 NOTE — Patient Instructions (Addendum)
 [facility]F [facility]  Shingrix is the new shingles shot, 2 shots over 2-6 months, confirm coverage with insurance and document, then can return here for shots with nurse appt or at pharmacy    Preventive Care 21-62 Years Old, Female Preventive care refers to lifestyle choices and visits with your health care provider that can promote health and wellness. Preventive care visits are also called wellness exams. What can I expect for my preventive care visit? Counseling Your health care provider may ask you questions about your: Medical history, including: Past medical problems. Family medical history. Pregnancy history. Current health, including: Menstrual cycle. Method of birth control. Emotional well-being. Home life and relationship well-being. Sexual activity and sexual health. Lifestyle, including: Alcohol, nicotine or tobacco, and drug use. Access to firearms. Diet, exercise, and sleep habits. Work and work Astronomer. Sunscreen use. Safety issues such as seatbelt and bike helmet use. Physical exam Your health care provider will check your: Height and weight. These may be used to calculate your BMI (body mass index). BMI is a measurement that tells if you are at a healthy weight. Waist circumference. This measures the distance around your waistline. This measurement also tells if you are at a healthy weight and may help predict your risk of certain diseases, such as type 2 diabetes and high blood pressure. Heart rate and blood pressure. Body temperature. Skin for abnormal spots. What immunizations do I need?  Vaccines are usually given at various ages, according to a schedule. Your health care provider will recommend vaccines for you based on your age, medical history, and lifestyle or other factors, such as travel or where you work. What tests do I need? Screening Your health care provider may recommend screening tests for certain conditions. This may  include: Lipid and cholesterol levels. Diabetes screening. This is done by checking your blood sugar (glucose) after you have not eaten for a while (fasting). Pelvic exam and Pap test. Hepatitis B test. Hepatitis C test. HIV (human immunodeficiency virus) test. STI (sexually transmitted infection) testing, if you are at risk. Lung cancer screening. Colorectal cancer screening. Mammogram. Talk with your health care provider about when you should start having regular mammograms. This may depend on whether you have a family history of breast cancer. BRCA-related cancer screening. This may be done if you have a family history of breast, ovarian, tubal, or peritoneal cancers. Bone density scan. This is done to screen for osteoporosis. Talk with your health care provider about your test results, treatment options, and if necessary, the need for more tests. Follow these instructions at home: Eating and drinking  Eat a diet that includes fresh fruits and vegetables, whole grains, lean protein, and low-fat dairy products. Take vitamin and mineral supplements as recommended by your health care provider. Do not drink alcohol if: Your health care provider tells you not to drink. You are pregnant, may be pregnant, or are planning to become pregnant. If you drink alcohol: Limit how much you have to 0-1 drink a day. Know how much alcohol is in your drink. In the [place] one drink equals one 12 oz bottle of beer (355 mL), one 5 oz glass of wine (148 mL), or one 1 oz glass of hard liquor (44 mL). Lifestyle Brush your teeth every morning and night with fluoride toothpaste. Floss one time each day. Exercise for at least 30 minutes 5 or more days each week. Do not use any products that contain nicotine or tobacco. These products include  cigarettes, chewing tobacco, and vaping devices, such as e-cigarettes. If you need help quitting, ask your health care provider. Do not use drugs. If you are sexually  active, practice safe sex. Use a condom or other form of protection to prevent STIs. If you do not wish to become pregnant, use a form of birth control. If you plan to become pregnant, see your health care provider for a prepregnancy visit. Take aspirin only as told by your health care provider. Make sure that you understand how much to take and what form to take. Work with your health care provider to find out whether it is safe and beneficial for you to take aspirin daily. Find healthy ways to manage stress, such as: Meditation, yoga, or listening to music. Journaling. Talking to a trusted person. Spending time with friends and family. Minimize exposure to UV radiation to reduce your risk of skin cancer. Safety Always wear your seat belt while driving or riding in a vehicle. Do not drive: If you have been drinking alcohol. Do not ride with someone who has been drinking. When you are tired or distracted. While texting. If you have been using any mind-altering substances or drugs. Wear a helmet and other protective equipment during sports activities. If you have firearms in your house, make sure you follow all gun safety procedures. Seek help if you have been physically or sexually abused. What's next? Visit your health care provider once a year for an annual wellness visit. Ask your health care provider how often you should have your eyes and teeth checked. Stay up to date on all vaccines. This information is not intended to replace advice given to you by your health care provider. Make sure you discuss any questions you have with your health care provider. Document Revised: 07/20/2020 Document Reviewed: 07/20/2020 Elsevier Patient Education  2024 [employer].

## 2023-11-11 NOTE — Assessment & Plan Note (Signed)
 Pap today, no concerns on exam.

## 2023-11-11 NOTE — Progress Notes (Signed)
 Subjective:    Patient ID: Maria Lucas, female    DOB: 18-Mar-1960, 63 y.o.   MRN: [id number]  Chief Complaint  Patient presents with  . Annual Exam    HPI Discussed the use of AI scribe software for clinical note transcription with the patient, who gave verbal consent to proceed.  History of Present Illness Maria Lucas is a 63 year old female with cervical arthritis who presents with tingling in her fingers and dry mouth.  She experiences tingling in her fingers and hands, which initially occurred occasionally but now happens weekly, primarily at night while sleeping. She uses a special pillow and performs stretching exercises with two-pound weights, which help, but do not fully resolve her symptoms. Her husband experiences similar symptoms of hands going to sleep, but her symptoms predate her use of atorvastatin .  She also experiences dry mouth. She denies other symptoms commonly associated with sleep apnea, such as waking up with headaches, snoring, or excessive fatigue.  She mentions hair loss. She has been taking atorvastatin  but notes that her symptoms of tingling and dry mouth predate the medication.  She walks her [place] dog regularly for physical activity. No vision or hearing changes, bowel troubles, or female concerns such as pain or discharge.    Past Medical History:  Diagnosis Date  . Acid reflux 10/29/2017  . Allergy 04/12/2011  . Anemia    menorraghia  . Atrophic vaginitis 10/12/2013  . Atypical chest pain 03/12/2011  . Breast mass, left 03/21/2011  . Cervical cancer screening 03/21/2011   Menarche at 17, 5-10 day cycles, variable flow LMP 03/06/11 G1P1 s/p SVD Fibrocystic breast by [facility] in past, last MGM 3 years ago. H/o endometrial cells in a woman over 40 once, no there abnormal paps Last pap and MGM 3 years ago.  . Closed trimalleolar fracture of left ankle 02/10/2020  . Complication of anesthesia    slow to wake up  . Elevated antinuclear antibody  (ANA) level 03/12/2011  . Elevated BP 03/12/2011   Patient reports 115-130/60-80 when she checks it elsewhere She believes it is secondary to a [place] phenomenon  . Glaucoma 03/12/2011  . Heart murmur   . History of gestational diabetes 08/18/2018  . Hyperglycemia 03/12/2011   H/o gestational DM, diet controlled  . Hyperlipidemia 03/12/2011  . Left foot pain 01/10/2017   Trimalleoloar fracture in early 2022 now with screw, wire and plate  . Osteopenia of lumbar spine 08/18/2018  . Palpitations   . Perennial allergic rhinitis 08/18/2018   Last Assessment & Plan:  Formatting of this note might be different from the original. Symptoms controlled with daily loratadine .  She feels more chest tightness lately related to being outdoors more, she has no exertional symptoms when going for exercise. Presently symptom-free, consider additional medication or changing out antihistamines for better control if needed.  For now we will continue l  . Perimenopausal 10/05/2012   Last menses October 2014  . Preventative health care 10/12/2013  . Refusal of blood transfusions as patient is Jehovah's Witness 10/12/2014  . Skin lesion 03/31/2021  . SOM (serous otitis media) 04/12/2011  . Sun-damaged skin 10/24/2016  . Tachycardia 03/21/2011  . Varicose veins of lower extremities with other complications 09/07/2013   Left leg, has been evaluated by vascular surgery in past.     Past Surgical History:  Procedure Laterality Date  . LACERATION REPAIR     age 58, dog attack, stitches in face, multipe sites right side of  face  . ORIF ANKLE FRACTURE Left 02/18/2020   Procedure: OPEN REDUCTION INTERNAL FIXATION (ORIF) Left trimalleolar fracture,  syndesmosis repair;  Surgeon: Kit Rush, MD;  Location: [facility];  Service: Orthopedics;  Laterality: Left;     Family History  Problem Relation Age of Onset  . Cancer Mother        skin, bcc, scc  . Heart disease Mother        arrythmia  . Hyperlipidemia  Mother   . Melanoma Mother   . Osteoporosis Mother   . Heart attack Father 23       smoker/drinker  . Hypertension Father   . Hyperlipidemia Father   . Heart disease Father 79       MI  . Varicose Veins Father   . Hypertension Sister   . Ovarian cysts Sister   . Hyperlipidemia Sister   . Hemachromatosis Sister   . Heart attack Brother 43  . Hypertension Brother   . Heart disease Brother   . Hyperlipidemia Brother   . Heart attack Maternal Grandmother   . Hypertension Maternal Grandmother        valvular heart disease  . Heart disease Maternal Grandmother   . Vascular Disease Maternal Grandmother   . Cancer Maternal Grandfather        skin, bcc  . Glaucoma Maternal Grandfather   . Stroke Paternal Grandfather   . Anxiety disorder Daughter   . OCD Daughter   . Colon cancer Neg Hx     Social History   Socioeconomic History  . Marital status: Married    Spouse name: Not on file  . Number of children: Not on file  . Years of education: Not on file  . Highest education level: 11th grade  Occupational History  . Not on file  Tobacco Use  . Smoking status: Never  . Smokeless tobacco: Never  Substance and Sexual Activity  . Alcohol use: No  . Drug use: No  . Sexual activity: Yes    Partners: Male  Other Topics Concern  . Not on file  Social History Narrative   Homemaker   One daughter   Married   Enjoys gardening, [place]   Has one dog   Social Drivers of Health   Financial Resource Strain: Low Risk  (03/26/2023)   Overall Financial Resource Strain (CARDIA)   . Difficulty of Paying Living Expenses: Not very hard  Food Insecurity: No Food Insecurity (03/26/2023)   Hunger Vital Sign   . Worried About Programme researcher, broadcasting/film/video in the Last Year: Never true   . Ran Out of Food in the Last Year: Never true  Transportation Needs: No Transportation Needs (03/26/2023)   PRAPARE - Transportation   . Lack of Transportation (Medical): No   . Lack of Transportation  (Non-Medical): No  Physical Activity: Insufficiently Active (03/26/2023)   Exercise Vital Sign   . Days of Exercise per Week: 2 days   . Minutes of Exercise per Session: 30 min  Stress: No Stress Concern Present (03/26/2023)   [employer] of Occupational Health - Occupational Stress Questionnaire   . Feeling of Stress : Not at all  Social Connections: Unknown (03/26/2023)   Social Connection and Isolation Panel   . Frequency of Communication with Friends and Family: Patient declined   . Frequency of Social Gatherings with Friends and Family: Twice a week   . Attends Religious Services: More than 4 times per year   . Active  Member of Clubs or Organizations: Yes   . Attends Banker Meetings: More than 4 times per year   . Marital Status: Married  Catering manager Violence: Not on file    Outpatient Medications Prior to Visit  Medication Sig Dispense Refill  . aspirin 81 MG tablet Take 81 mg by mouth daily.    . atorvastatin  (LIPITOR) 10 MG tablet TAKE ONE TABLET BY MOUTH DAILY AT BEDTIME 90 tablet 1  . Calcium -Magnesium-Vitamin D  (CITRACAL CALCIUM +D PO) Take 1 tablet by mouth daily.    . Cholecalciferol (DIALYVITE VITAMIN D  5000 PO) Take by mouth.    . Coenzyme Q10 10 MG capsule Take 1 capsule by mouth daily.    . estradiol  (ESTRACE  VAGINAL) 0.1 MG/GM vaginal cream Apply small amount vaginally 3 x weekly 42.5 g 5  . Flaxseed Oil OIL Take 1,400 mg by mouth daily.    SABRA latanoprost (XALATAN) 0.005 % ophthalmic solution Place 1 drop into both eyes at bedtime.    . lisinopril  (ZESTRIL ) 10 MG tablet Take 1 tablet (10 mg total) by mouth daily. 90 tablet 0  . multivitamin-iron-minerals-folic acid  (CENTRUM) chewable tablet Chew 1 tablet by mouth daily.    . Omega-3 Fatty Acids (OMEGA 3 500 PO) Take by mouth.    . Probiotic Product (PROBIOTIC DAILY PO) Take 1 each by mouth daily.     No facility-administered medications prior to visit.    Allergies  Allergen Reactions  .  Penicillins Rash  . Clindamycin Hcl Rash    Review of Systems  Constitutional:  Negative for chills, fever and malaise/fatigue.  HENT:  Negative for congestion and hearing loss.   Eyes:  Negative for discharge.  Respiratory:  Negative for cough, sputum production and shortness of breath.   Cardiovascular:  Negative for chest pain, palpitations and leg swelling.  Gastrointestinal:  Negative for abdominal pain, blood in stool, constipation, diarrhea, heartburn, nausea and vomiting.  Genitourinary:  Negative for dysuria, frequency, hematuria and urgency.  Musculoskeletal:  Negative for back pain, falls and myalgias.  Skin:  Negative for rash.  Neurological:  Negative for dizziness, sensory change, loss of consciousness, weakness and headaches.  Endo/Heme/Allergies:  Negative for environmental allergies. Does not bruise/bleed easily.  Psychiatric/Behavioral:  Negative for depression and suicidal ideas. The patient is not nervous/anxious and does not have insomnia.        Objective:    Physical Exam Constitutional:      General: She is not in acute distress.    Appearance: Normal appearance. She is not diaphoretic.  HENT:     Head: Normocephalic and atraumatic.     Right Ear: Tympanic membrane, ear canal and external ear normal.     Left Ear: Tympanic membrane, ear canal and external ear normal.     Nose: Nose normal.     Mouth/Throat:     Mouth: Mucous membranes are moist.     Pharynx: Oropharynx is clear. No oropharyngeal exudate.  Eyes:     General: No scleral icterus.       Right eye: No discharge.        Left eye: No discharge.     Conjunctiva/sclera: Conjunctivae normal.     Pupils: Pupils are equal, round, and reactive to light.  Neck:     Thyroid : No thyromegaly.  Cardiovascular:     Rate and Rhythm: Normal rate and regular rhythm.     Heart sounds: Normal heart sounds. No murmur heard. Pulmonary:     Effort: Pulmonary effort  is normal. No respiratory distress.      Breath sounds: Normal breath sounds. No wheezing or rales.  Abdominal:     General: Bowel sounds are normal. There is no distension.     Palpations: Abdomen is soft. There is no mass.     Tenderness: There is no abdominal tenderness.  Genitourinary:    General: Normal vulva.     Vagina: No vaginal discharge.     Rectum: Normal.     Comments: No cervical lesions or CMT Musculoskeletal:        General: No tenderness. Normal range of motion.     Cervical back: Normal range of motion and neck supple.  Lymphadenopathy:     Cervical: No cervical adenopathy.  Skin:    General: Skin is warm and dry.     Findings: No rash.  Neurological:     General: No focal deficit present.     Mental Status: She is alert and oriented to person, place, and time.     Cranial Nerves: No cranial nerve deficit.     Coordination: Coordination normal.     Deep Tendon Reflexes: Reflexes are normal and symmetric. Reflexes normal.  Psychiatric:        Mood and Affect: Mood normal.        Behavior: Behavior normal.        Thought Content: Thought content normal.        Judgment: Judgment normal.    BP 131/63 (BP Location: Right Arm, Patient Position: Sitting, Cuff Size: Normal)   Pulse 80   Temp 98.2 F (36.8 C) (Oral)   Resp 16   Ht 5' 4.5 (1.638 m)   Wt 161 lb (73 kg)   LMP 11/05/2012   SpO2 100%   BMI 27.21 kg/m  Wt Readings from Last 3 Encounters:  11/11/23 161 lb (73 kg)  04/05/23 160 lb (72.6 kg)  03/26/23 161 lb (73 kg)    Diabetic Foot Exam - Simple   No data filed    Lab Results  Component Value Date   WBC 5.7 11/05/2022   HGB 13.6 11/05/2022   HCT 41.6 11/05/2022   PLT 197.0 11/05/2022   GLUCOSE 106 (H) 04/10/2023   CHOL 196 11/05/2022   TRIG 155.0 (H) 11/05/2022   HDL 84.60 11/05/2022   LDLDIRECT 122.7 03/26/2012   LDLCALC 80 11/05/2022   ALT 16 11/05/2022   AST 22 11/05/2022   NA 140 04/10/2023   K 3.8 04/10/2023   CL 102 04/10/2023   CREATININE 0.81 04/10/2023   BUN  17 04/10/2023   CO2 31 04/10/2023   TSH 2.11 11/05/2022   HGBA1C 5.9 11/05/2022    Lab Results  Component Value Date   TSH 2.11 11/05/2022   Lab Results  Component Value Date   WBC 5.7 11/05/2022   HGB 13.6 11/05/2022   HCT 41.6 11/05/2022   MCV 94.9 11/05/2022   PLT 197.0 11/05/2022   Lab Results  Component Value Date   NA 140 04/10/2023   K 3.8 04/10/2023   CO2 31 04/10/2023   GLUCOSE 106 (H) 04/10/2023   BUN 17 04/10/2023   CREATININE 0.81 04/10/2023   BILITOT 0.6 11/05/2022   ALKPHOS 82 11/05/2022   AST 22 11/05/2022   ALT 16 11/05/2022   PROT 6.9 11/05/2022   ALBUMIN 4.4 11/05/2022   CALCIUM  9.7 04/10/2023   GFR 78.04 04/10/2023   Lab Results  Component Value Date   CHOL 196 11/05/2022   Lab Results  Component Value Date   HDL 84.60 11/05/2022   Lab Results  Component Value Date   LDLCALC 80 11/05/2022   Lab Results  Component Value Date   TRIG 155.0 (H) 11/05/2022   Lab Results  Component Value Date   CHOLHDL 2 11/05/2022   Lab Results  Component Value Date   HGBA1C 5.9 11/05/2022       Assessment & Plan:  Hyperglycemia Assessment & Plan: hgba1c acceptable, minimize simple carbs. Increase exercise as tolerated.   Orders: -     Comprehensive metabolic panel with GFR -     Hemoglobin A1c  Mixed hyperlipidemia Assessment & Plan: Encourage heart healthy diet such as MIND or DASH diet, increase exercise, avoid trans fats, simple carbohydrates and processed foods, consider a krill or fish or flaxseed oil cap daily. Tolerating Atorvastatin   Orders: -     Lipid panel  Primary hypertension Assessment & Plan: Well controlled, no changes to meds. Encouraged heart healthy diet such as the DASH diet and exercise as tolerated.    Orders: -     CBC with Differential/Platelet -     TSH  Osteopenia of lumbar spine Assessment & Plan: Encouraged to get adequate exercise, calcium  and vitamin d  intake    Preventative health care Assessment &  Plan: Patient encouraged to maintain heart healthy diet, regular exercise, adequate sleep. Consider daily probiotics. Take medications as prescribed.Colonoscopy: Last completed on 06/19/2013. Normal results. Repeat in 2025.   Dexxa: Last completed on 10/18/2020. -The probability of a major osteoporotic fracture is 14.3% within the next 10 years. -The probability of a hip fracture is 1.5% within the next ten years.  Repeat 10/2022 to 2027  Pap Smear: Last completed on 09/19/2020. Normal results. Repeat in 3-5 years.    Mammogram: Last completed on 11/2022. No mammographic evidence of malignancy. Repeat in 2025.    Estrogen deficiency -     DG Bone Density; Future  Post-menopausal -     DG Bone Density; Future  Breast cancer screening by mammogram -     3D Screening Mammogram, Left and Right; Future  Colon cancer screening -     Ambulatory referral to Gastroenterology  Cervical cancer screening Assessment & Plan: Pap today, no concerns on exam.    Orders: -     Cytology - PAP    Assessment and Plan Assessment & Plan Adult Wellness Visit Adult wellness visit for ongoing management of chronic conditions; no acute exacerbations reported. - Order blood work to check white blood cell count, anemia, thyroid , cholesterol, and glucose levels. - Discussed importance of hydration, multivitamin, and omega fatty acids. - Encouraged maintaining physical activity and healthy sleep patterns.  Cervical spondylosis with radiculopathy Chronic cervical spondylosis with radiculopathy causing tingling in fingers and hands, especially at night. Symptoms likely due to arthritis and nerve impingement. No significant neck pain reported. Unlikely related to atorvastatin  use. - Encouraged neck stretching exercises and maintaining good posture. - Discussed possibility of taking a break from atorvastatin  to assess symptom changes, if concerned about medication side effects.  Postmenopausal estrogen  deficiency Postmenopausal estrogen deficiency contributing to symptoms such as dry mouth. Considered potential link between dry mouth and sleep apnea, though not strongly suspected. - Encouraged regular hydration with 5-10 ounces of water every 1-2 hours.  Mixed hyperlipidemia Mixed hyperlipidemia managed with atorvastatin .  Essential hypertension Essential hypertension with no new concerns or changes in management.  Hyperglycemia Mild hyperglycemia noted in previous labs. - Order blood work to recheck glucose levels.  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 No acute concerns noted. - Order bone density scan for April 2026.  General Health Maintenance Discussed vaccinations and screenings as part of preventive care. Emphasized importance of flu and COVID vaccinations, especially during cold and flu season. Plan for pneumonia and RSV vaccinations at age 4. - Encouraged shingles vaccination due to its link to reduced dementia risk. - Order mammogram for April 2026. - Discussed colonoscopy referral to Dr. Darilyn group for screening.  Recording duration: 21 minutes     Harlene Horton, MD

## 2023-11-12 ENCOUNTER — Ambulatory Visit: Payer: Self-pay | Admitting: Family Medicine

## 2023-11-13 LAB — CYTOLOGY - PAP: Diagnosis: NEGATIVE

## 2024-01-13 ENCOUNTER — Telehealth: Payer: Self-pay

## 2024-01-13 NOTE — Telephone Encounter (Signed)
 RN attempted to call patient. No answer.  LVM: informed patient it is time for her colonoscopy due to recall, to call back to schedule at her earliest convenience. [facility] number left on voicemail.

## 2024-01-22 ENCOUNTER — Other Ambulatory Visit (HOSPITAL_COMMUNITY): Payer: Self-pay

## 2024-01-22 ENCOUNTER — Other Ambulatory Visit: Payer: Self-pay | Admitting: Family Medicine

## 2024-01-22 ENCOUNTER — Other Ambulatory Visit (HOSPITAL_BASED_OUTPATIENT_CLINIC_OR_DEPARTMENT_OTHER): Payer: Self-pay

## 2024-01-23 ENCOUNTER — Other Ambulatory Visit (HOSPITAL_COMMUNITY): Payer: Self-pay

## 2024-01-23 ENCOUNTER — Other Ambulatory Visit (HOSPITAL_BASED_OUTPATIENT_CLINIC_OR_DEPARTMENT_OTHER): Payer: Self-pay

## 2024-01-23 ENCOUNTER — Other Ambulatory Visit: Payer: Self-pay

## 2024-01-23 MED ORDER — ESTRADIOL 0.01 % VA CREA
TOPICAL_CREAM | VAGINAL | 5 refills | Status: AC
  Filled 2024-01-23: qty 42.5, 30d supply, fill #0

## 2024-01-25 ENCOUNTER — Other Ambulatory Visit: Payer: Self-pay | Admitting: Family Medicine

## 2024-02-20 ENCOUNTER — Ambulatory Visit (HOSPITAL_BASED_OUTPATIENT_CLINIC_OR_DEPARTMENT_OTHER)
Admission: RE | Admit: 2024-02-20 | Discharge: 2024-02-20 | Disposition: A | Discharge status: Home / Self Care | Source: Ambulatory Visit | Attending: Family Medicine | Admitting: Family Medicine

## 2024-02-20 ENCOUNTER — Encounter (HOSPITAL_BASED_OUTPATIENT_CLINIC_OR_DEPARTMENT_OTHER): Payer: Self-pay

## 2024-02-20 ENCOUNTER — Inpatient Hospital Stay (HOSPITAL_BASED_OUTPATIENT_CLINIC_OR_DEPARTMENT_OTHER)
Admission: RE | Admit: 2024-02-20 | Discharge: 2024-02-20 | Disposition: A | Source: Ambulatory Visit | Attending: Family Medicine | Admitting: Family Medicine

## 2024-02-20 DIAGNOSIS — Z1231 Encounter for screening mammogram for malignant neoplasm of breast: Secondary | ICD-10-CM

## 2024-02-20 DIAGNOSIS — E2839 Other primary ovarian failure: Secondary | ICD-10-CM | POA: Diagnosis present

## 2024-02-20 DIAGNOSIS — Z780 Asymptomatic menopausal state: Secondary | ICD-10-CM | POA: Diagnosis present

## 2024-02-25 NOTE — Progress Notes (Signed)
"  Pt reviewed via MyChart.   "

## 2024-03-03 ENCOUNTER — Encounter: Payer: Self-pay | Admitting: Gastroenterology

## 2024-05-11 ENCOUNTER — Ambulatory Visit: Admitting: Family Medicine

## 2024-09-24 ENCOUNTER — Encounter: Admitting: Family Medicine

## 2024-11-12 ENCOUNTER — Encounter: Admitting: Family Medicine
